# Patient Record
Sex: Male | Born: 1940 | Race: White | Hispanic: No | Marital: Married | Smoking: Former smoker
Health system: Southern US, Community
[De-identification: ages and names within clinical notes are randomized; demographics above are authoritative.]

## PROBLEM LIST (undated history)

## (undated) DIAGNOSIS — M3500 Sicca syndrome, unspecified: Secondary | ICD-10-CM

## (undated) DIAGNOSIS — I10 Essential (primary) hypertension: Secondary | ICD-10-CM

## (undated) DIAGNOSIS — N529 Male erectile dysfunction, unspecified: Secondary | ICD-10-CM

## (undated) DIAGNOSIS — A419 Sepsis, unspecified organism: Secondary | ICD-10-CM

## (undated) DIAGNOSIS — J189 Pneumonia, unspecified organism: Secondary | ICD-10-CM

## (undated) DIAGNOSIS — I2510 Atherosclerotic heart disease of native coronary artery without angina pectoris: Secondary | ICD-10-CM

## (undated) DIAGNOSIS — R310 Gross hematuria: Secondary | ICD-10-CM

## (undated) DIAGNOSIS — J479 Bronchiectasis, uncomplicated: Secondary | ICD-10-CM

## (undated) DIAGNOSIS — F32A Depression, unspecified: Secondary | ICD-10-CM

## (undated) DIAGNOSIS — Z8679 Personal history of other diseases of the circulatory system: Secondary | ICD-10-CM

## (undated) DIAGNOSIS — R0600 Dyspnea, unspecified: Secondary | ICD-10-CM

## (undated) DIAGNOSIS — R972 Elevated prostate specific antigen [PSA]: Secondary | ICD-10-CM

## (undated) DIAGNOSIS — R296 Repeated falls: Secondary | ICD-10-CM

## (undated) DIAGNOSIS — U071 COVID-19: Secondary | ICD-10-CM

## (undated) DIAGNOSIS — J9601 Acute respiratory failure with hypoxia: Secondary | ICD-10-CM

## (undated) DIAGNOSIS — E785 Hyperlipidemia, unspecified: Secondary | ICD-10-CM

## (undated) DIAGNOSIS — E43 Unspecified severe protein-calorie malnutrition: Secondary | ICD-10-CM

## (undated) DIAGNOSIS — I253 Aneurysm of heart: Secondary | ICD-10-CM

## (undated) DIAGNOSIS — I255 Ischemic cardiomyopathy: Secondary | ICD-10-CM

## (undated) DIAGNOSIS — D649 Anemia, unspecified: Secondary | ICD-10-CM

## (undated) DIAGNOSIS — J849 Interstitial pulmonary disease, unspecified: Secondary | ICD-10-CM

## (undated) DIAGNOSIS — W19XXXA Unspecified fall, initial encounter: Secondary | ICD-10-CM

## (undated) DIAGNOSIS — N400 Enlarged prostate without lower urinary tract symptoms: Secondary | ICD-10-CM

## (undated) HISTORY — PX: COLONOSCOPY: SHX174

## (undated) HISTORY — DX: Ischemic cardiomyopathy: I25.5

## (undated) HISTORY — DX: Hyperlipidemia, unspecified: E78.5

## (undated) HISTORY — DX: Atherosclerotic heart disease of native coronary artery without angina pectoris: I25.10

---

## 2007-03-10 DIAGNOSIS — I219 Acute myocardial infarction, unspecified: Secondary | ICD-10-CM

## 2007-03-10 HISTORY — DX: Acute myocardial infarction, unspecified: I21.9

## 2008-03-06 ENCOUNTER — Ambulatory Visit: Payer: Self-pay | Admitting: Cardiovascular Disease

## 2008-03-06 ENCOUNTER — Inpatient Hospital Stay: Payer: Self-pay | Admitting: Cardiovascular Disease

## 2008-03-06 ENCOUNTER — Ambulatory Visit: Payer: Self-pay | Admitting: Family Medicine

## 2008-03-07 ENCOUNTER — Inpatient Hospital Stay (HOSPITAL_COMMUNITY): Admission: AD | Admit: 2008-03-07 | Discharge: 2008-03-17 | Payer: Self-pay | Admitting: Cardiology

## 2008-03-07 ENCOUNTER — Ambulatory Visit: Payer: Self-pay | Admitting: Surgery

## 2008-03-07 ENCOUNTER — Ambulatory Visit: Payer: Self-pay | Admitting: Cardiovascular Disease

## 2008-03-08 ENCOUNTER — Encounter: Payer: Self-pay | Admitting: Surgery

## 2008-03-08 ENCOUNTER — Encounter: Payer: Self-pay | Admitting: Cardiology

## 2008-03-09 DIAGNOSIS — Z951 Presence of aortocoronary bypass graft: Secondary | ICD-10-CM

## 2008-03-09 DIAGNOSIS — I219 Acute myocardial infarction, unspecified: Secondary | ICD-10-CM

## 2008-03-09 HISTORY — PX: CARDIAC CATHETERIZATION: SHX172

## 2008-03-09 HISTORY — DX: Presence of aortocoronary bypass graft: Z95.1

## 2008-03-09 HISTORY — DX: Acute myocardial infarction, unspecified: I21.9

## 2008-03-12 HISTORY — PX: CORONARY ARTERY BYPASS GRAFT: SHX141

## 2008-04-02 ENCOUNTER — Ambulatory Visit: Payer: Self-pay | Admitting: Cardiovascular Disease

## 2008-04-05 ENCOUNTER — Encounter: Admission: RE | Admit: 2008-04-05 | Discharge: 2008-04-05 | Payer: Self-pay | Admitting: Cardiothoracic Surgery

## 2008-04-05 ENCOUNTER — Ambulatory Visit: Payer: Self-pay | Admitting: Cardiothoracic Surgery

## 2008-05-29 ENCOUNTER — Encounter: Payer: Self-pay | Admitting: Cardiovascular Disease

## 2008-05-29 ENCOUNTER — Ambulatory Visit: Payer: Self-pay

## 2008-06-12 ENCOUNTER — Ambulatory Visit: Payer: Self-pay | Admitting: Cardiovascular Disease

## 2008-09-29 DIAGNOSIS — E785 Hyperlipidemia, unspecified: Secondary | ICD-10-CM

## 2008-09-29 DIAGNOSIS — I25810 Atherosclerosis of coronary artery bypass graft(s) without angina pectoris: Secondary | ICD-10-CM

## 2008-09-29 DIAGNOSIS — I2589 Other forms of chronic ischemic heart disease: Secondary | ICD-10-CM

## 2008-11-23 ENCOUNTER — Encounter (INDEPENDENT_AMBULATORY_CARE_PROVIDER_SITE_OTHER): Payer: Self-pay | Admitting: *Deleted

## 2008-12-12 ENCOUNTER — Ambulatory Visit: Payer: Self-pay | Admitting: Cardiovascular Disease

## 2008-12-12 ENCOUNTER — Encounter: Payer: Self-pay | Admitting: Cardiothoracic Surgery

## 2008-12-13 LAB — CONVERTED CEMR LAB
BUN: 18 mg/dL (ref 6–23)
CO2: 22 meq/L (ref 19–32)
Calcium: 9.7 mg/dL (ref 8.4–10.5)
Chloride: 106 meq/L (ref 96–112)
Creatinine, Ser: 0.91 mg/dL (ref 0.40–1.50)
Glucose, Bld: 87 mg/dL (ref 70–99)
Potassium: 4.7 meq/L (ref 3.5–5.3)
Sodium: 142 meq/L (ref 135–145)

## 2008-12-20 ENCOUNTER — Encounter: Payer: Self-pay | Admitting: Cardiovascular Disease

## 2008-12-20 ENCOUNTER — Ambulatory Visit (HOSPITAL_COMMUNITY): Admission: RE | Admit: 2008-12-20 | Discharge: 2008-12-20 | Payer: Self-pay | Admitting: Cardiovascular Disease

## 2008-12-20 ENCOUNTER — Ambulatory Visit: Payer: Self-pay | Admitting: Cardiology

## 2008-12-25 ENCOUNTER — Telehealth (INDEPENDENT_AMBULATORY_CARE_PROVIDER_SITE_OTHER): Payer: Self-pay | Admitting: *Deleted

## 2009-07-11 ENCOUNTER — Telehealth: Payer: Self-pay | Admitting: Cardiovascular Disease

## 2009-08-20 ENCOUNTER — Telehealth: Payer: Self-pay | Admitting: Cardiovascular Disease

## 2009-09-18 IMAGING — CR DG CHEST 2V
2 series · 2 of 2 positions shown · non-contrast
Comparison: Insert [REDACTED] chest x-ray 03/15/2008.

CLINICAL DATA: CABG 03/12/2008.  Former smoker.  No chest
complaints.

CHEST - 2 VIEW

[view not recorded (1 of 2)]
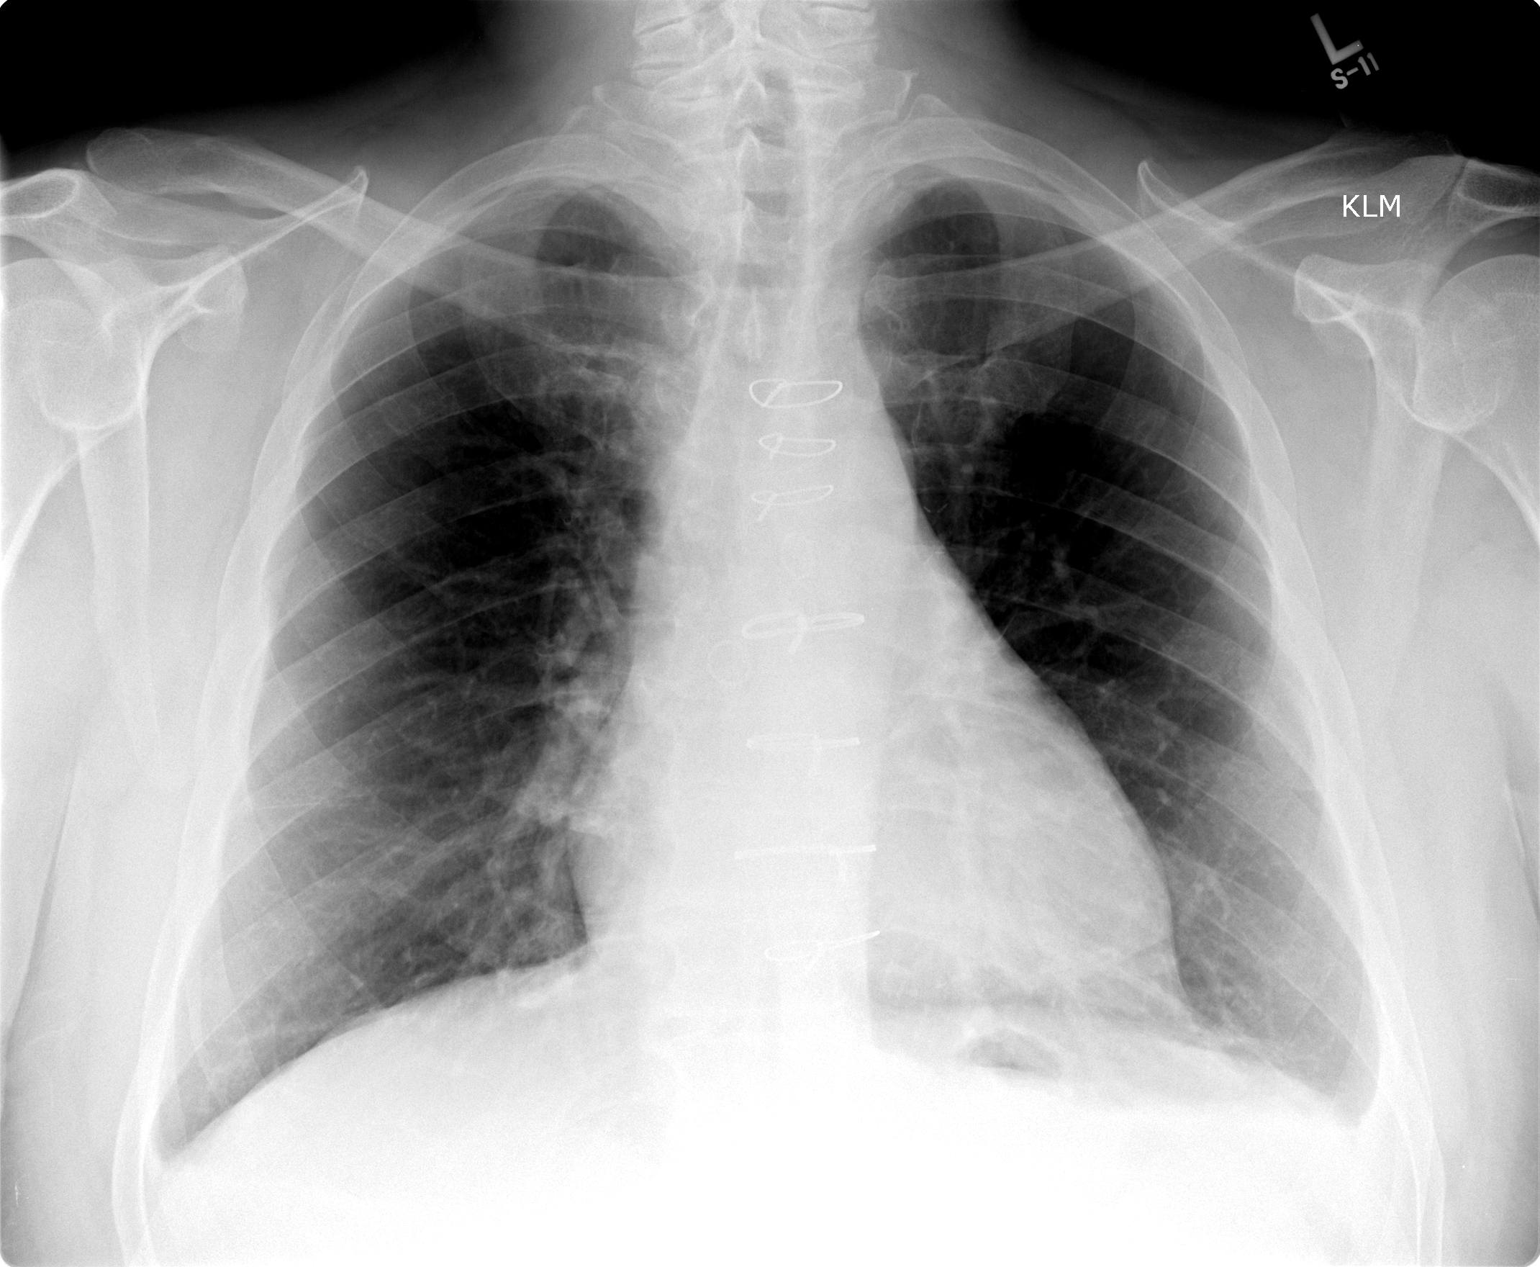

[view not recorded (2 of 2)]
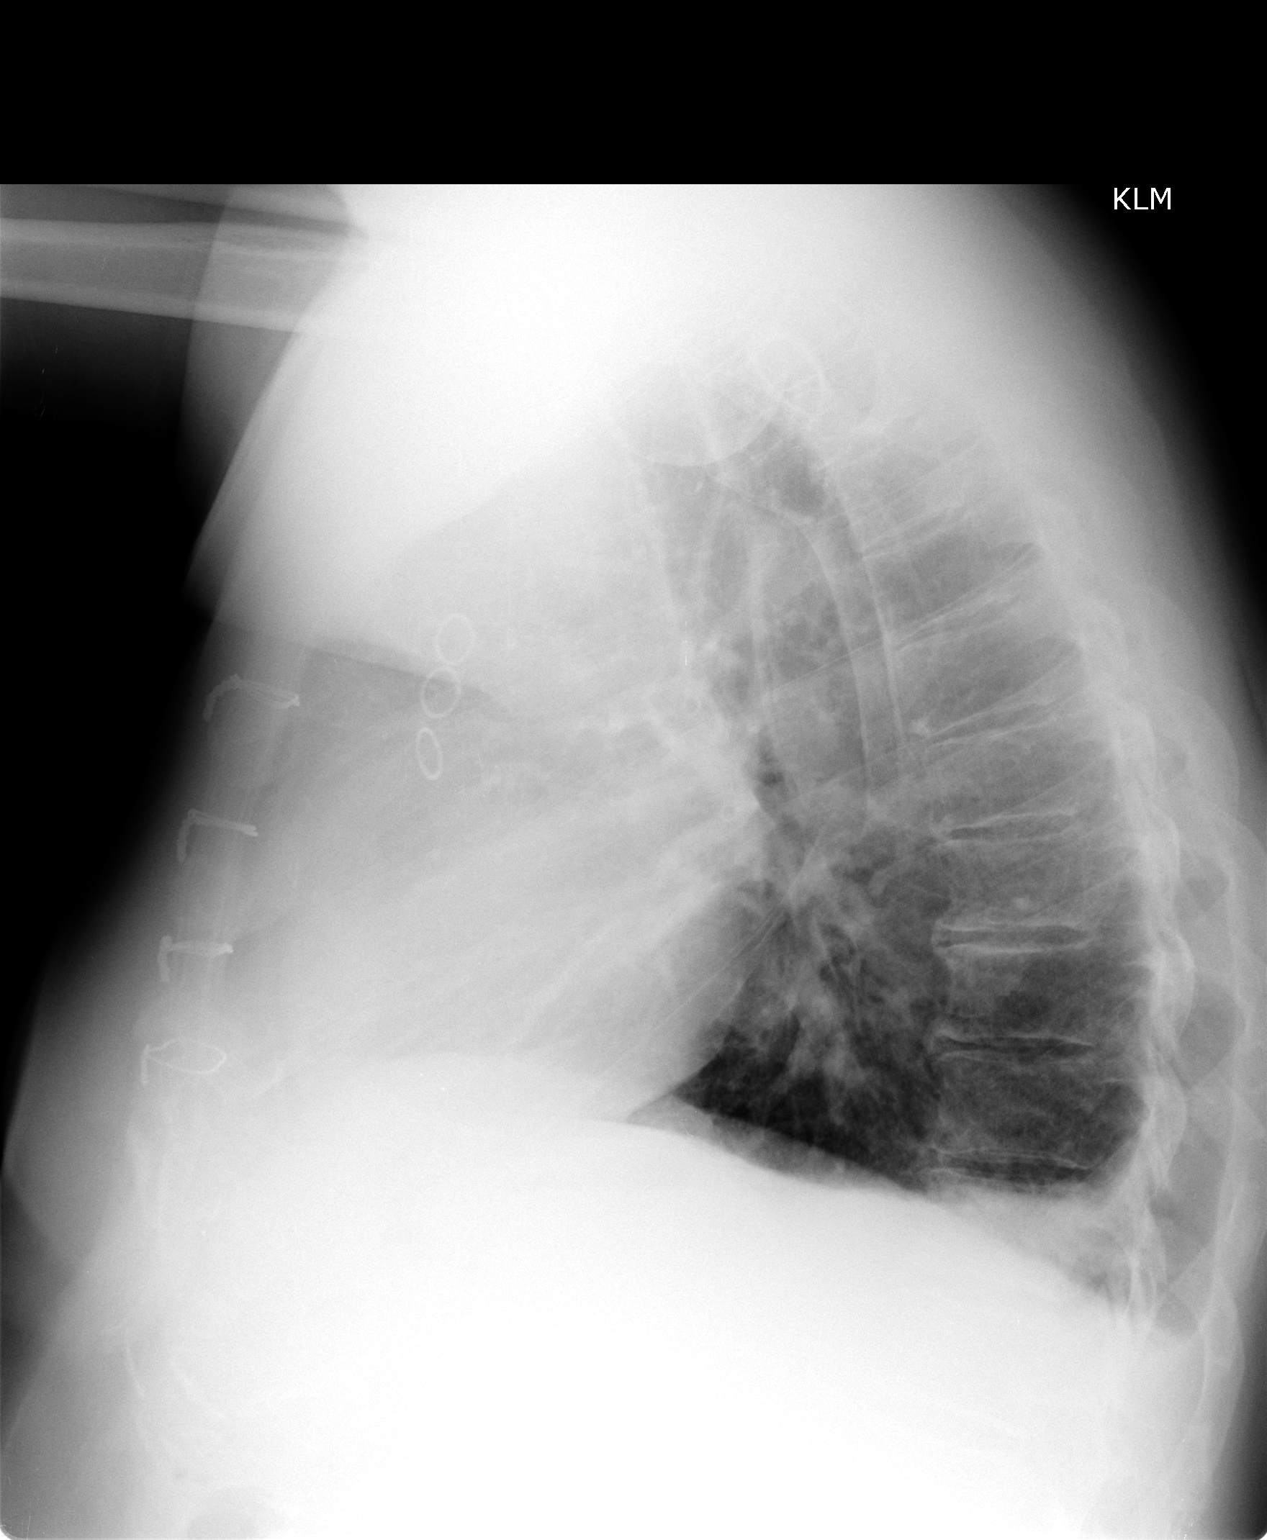

[2 of 2 positions shown; findings below may reference images not displayed]

FINDINGS: Regressing currently tiny residual pleural effusions are
seen.  Minimal linear atelectasis is seen at the lung bases with
lungs otherwise clear without pneumothorax.  Heart size is normal
with post CABG changes.  Stable slight degenerative changes dorsal
spine noted.  No new acute findings seen.
IMPRESSION: 1.  Regressing currently tiny bilateral pleural effusions and
minimal linear bibasilar atelectasis.
2.  Post CABG.
3.  Otherwise no acute findings.

## 2009-11-07 HISTORY — PX: PROSTATE BIOPSY: SHX241

## 2009-12-09 ENCOUNTER — Ambulatory Visit: Payer: Self-pay | Admitting: Family Medicine

## 2010-04-08 NOTE — Assessment & Plan Note (Signed)
Summary: FLU SHOT/EVM    Current Allergies: No known allergies  Assessment New Problems: NEED PROPHYLACTIC VACCINATION&INOCULATION FLU (ICD-V04.81)   The patient and/or caregiver has been counseled thoroughly with regard to medications prescribed including dosage, schedule, interactions, rationale for use, and possible side effects and they verbalize understanding.  Diagnoses and expected course of recovery discussed and will return if not improved as expected or if the condition worsens. Patient and/or caregiver verbalized understanding.   Medication Administration  Injection # 1:    Medication: Influenza    Diagnosis: NEED PROPHYLACTIC VACCINATION&INOCULATION FLU (ICD-V04.81)    Route: IM    Site: R deltoid    Exp Date: 08/07/2010    Lot #: [id number]    Mfr: Gl[employer]oSmithKline    Comments: Assessed pt. for allergies and past immunization history. No allergy to eggs or adverse reactions to any previous flu vaccine. Administered influenza vaccine without complications . Pt. tolerated injection well.    Patient tolerated injection without complications    Given by: R. Hils LPN  Orders Added: 1)  INFLUENZA VIRUS VACCINE SPLIT VIRUS 3 YEARS + I [[id number]]

## 2010-04-08 NOTE — Progress Notes (Signed)
Summary: med refill Carvedilol  Phone Note Call from Patient   Caller: Spouse Call For: [phone] Summary of Call: Wife states pt saw Dr. Excell Seltzer but was discharged.  He has an appt to see Dr. Randa Lynn next week but will run out of Carvedilol before appt. Can we give 1 refill to [facility] [employer]?   Initial call taken by: Park Breed,  August 20, 2009 3:08 PM    Prescriptions: CARVEDILOL 25 MG TABS (CARVEDILOL) Take one  tablet by mouth twice a day  #60 x 6   Entered by:   Bishop Dublin, CMA   Authorized by:   Norva Karvonen, MD   Signed by:   Bishop Dublin, CMA on 08/20/2009   Method used:   Electronically to        [facility] [address] (retail)       [address]       [place], [place]  [address]       Ph: [phone]       Fax: [phone]   RxID:   [id number]

## 2010-04-08 NOTE — Progress Notes (Signed)
Summary: Refill   Phone Note Refill Request Message from:  Patient on Jul 11, 2009 10:19 AM  Refills Requested: Medication #1:  SIMVASTATIN 40 MG TABS Take one tablet by mouth daily at bedtime.  Medication #2:  LISINOPRIL 20 MG TABS Take one tablet by mouth daily [facility] [address]  Initial call taken by: West Carbo,  Jul 11, 2009 10:20 AM Caller: Spouse Call For: Dr. Excell Seltzer    Prescriptions: LISINOPRIL 20 MG TABS (LISINOPRIL) Take one tablet by mouth daily  #30 x 6   Entered by:   Mercer Pod   Authorized by:   Norva Karvonen, MD   Signed by:   Mercer Pod on 07/11/2009   Method used:   Electronically to        [facility] [address] (retail)       [address]       [place], [place]  [address]       Ph: [phone]       Fax: [phone]   RxID:   [id number] SIMVASTATIN 40 MG TABS (SIMVASTATIN) Take one tablet by mouth daily at bedtime  #30 x 6   Entered by:   Mercer Pod   Authorized by:   Norva Karvonen, MD   Signed by:   Mercer Pod on 07/11/2009   Method used:   Electronically to        [facility] [address] (retail)       [address]       [place], [place]  [address]       Ph: [phone]       Fax: [phone]   RxID:   [id number]

## 2010-06-23 LAB — GLUCOSE, CAPILLARY
Glucose-Capillary: 109 mg/dL — ABNORMAL HIGH (ref 70–99)
Glucose-Capillary: 110 mg/dL — ABNORMAL HIGH (ref 70–99)
Glucose-Capillary: 113 mg/dL — ABNORMAL HIGH (ref 70–99)
Glucose-Capillary: 114 mg/dL — ABNORMAL HIGH (ref 70–99)
Glucose-Capillary: 117 mg/dL — ABNORMAL HIGH (ref 70–99)
Glucose-Capillary: 119 mg/dL — ABNORMAL HIGH (ref 70–99)
Glucose-Capillary: 122 mg/dL — ABNORMAL HIGH (ref 70–99)
Glucose-Capillary: 126 mg/dL — ABNORMAL HIGH (ref 70–99)
Glucose-Capillary: 126 mg/dL — ABNORMAL HIGH (ref 70–99)
Glucose-Capillary: 126 mg/dL — ABNORMAL HIGH (ref 70–99)
Glucose-Capillary: 127 mg/dL — ABNORMAL HIGH (ref 70–99)
Glucose-Capillary: 128 mg/dL — ABNORMAL HIGH (ref 70–99)
Glucose-Capillary: 130 mg/dL — ABNORMAL HIGH (ref 70–99)
Glucose-Capillary: 131 mg/dL — ABNORMAL HIGH (ref 70–99)
Glucose-Capillary: 132 mg/dL — ABNORMAL HIGH (ref 70–99)
Glucose-Capillary: 136 mg/dL — ABNORMAL HIGH (ref 70–99)
Glucose-Capillary: 139 mg/dL — ABNORMAL HIGH (ref 70–99)

## 2010-06-23 LAB — CBC
HCT: 29.5 % — ABNORMAL LOW (ref 39.0–52.0)
HCT: 29.6 % — ABNORMAL LOW (ref 39.0–52.0)
HCT: 30 % — ABNORMAL LOW (ref 39.0–52.0)
HCT: 30 % — ABNORMAL LOW (ref 39.0–52.0)
HCT: 30.5 % — ABNORMAL LOW (ref 39.0–52.0)
HCT: 30.7 % — ABNORMAL LOW (ref 39.0–52.0)
HCT: 31.5 % — ABNORMAL LOW (ref 39.0–52.0)
HCT: 40.4 % (ref 39.0–52.0)
HCT: 40.5 % (ref 39.0–52.0)
Hemoglobin: 10 g/dL — ABNORMAL LOW (ref 13.0–17.0)
Hemoglobin: 10.1 g/dL — ABNORMAL LOW (ref 13.0–17.0)
Hemoglobin: 10.2 g/dL — ABNORMAL LOW (ref 13.0–17.0)
Hemoglobin: 10.2 g/dL — ABNORMAL LOW (ref 13.0–17.0)
Hemoglobin: 10.4 g/dL — ABNORMAL LOW (ref 13.0–17.0)
Hemoglobin: 10.8 g/dL — ABNORMAL LOW (ref 13.0–17.0)
Hemoglobin: 13.3 g/dL (ref 13.0–17.0)
MCHC: 33.2 g/dL (ref 30.0–36.0)
MCHC: 33.7 g/dL (ref 30.0–36.0)
MCHC: 34 g/dL (ref 30.0–36.0)
MCHC: 34 g/dL (ref 30.0–36.0)
MCHC: 34 g/dL (ref 30.0–36.0)
MCHC: 34.2 g/dL (ref 30.0–36.0)
MCHC: 34.3 g/dL (ref 30.0–36.0)
MCV: 85.2 fL (ref 78.0–100.0)
MCV: 85.2 fL (ref 78.0–100.0)
MCV: 85.3 fL (ref 78.0–100.0)
MCV: 85.3 fL (ref 78.0–100.0)
MCV: 85.4 fL (ref 78.0–100.0)
MCV: 85.8 fL (ref 78.0–100.0)
MCV: 86.1 fL (ref 78.0–100.0)
MCV: 86.2 fL (ref 78.0–100.0)
MCV: 86.3 fL (ref 78.0–100.0)
MCV: 86.5 fL (ref 78.0–100.0)
Platelets: 206 10*3/uL (ref 150–400)
Platelets: 229 10*3/uL (ref 150–400)
Platelets: 250 10*3/uL (ref 150–400)
Platelets: 251 10*3/uL (ref 150–400)
Platelets: 255 10*3/uL (ref 150–400)
Platelets: 270 10*3/uL (ref 150–400)
Platelets: 273 10*3/uL (ref 150–400)
Platelets: 325 10*3/uL (ref 150–400)
Platelets: 341 10*3/uL (ref 150–400)
Platelets: 414 10*3/uL — ABNORMAL HIGH (ref 150–400)
RBC: 3.46 MIL/uL — ABNORMAL LOW (ref 4.22–5.81)
RBC: 3.47 MIL/uL — ABNORMAL LOW (ref 4.22–5.81)
RBC: 3.49 MIL/uL — ABNORMAL LOW (ref 4.22–5.81)
RBC: 3.52 MIL/uL — ABNORMAL LOW (ref 4.22–5.81)
RBC: 3.57 MIL/uL — ABNORMAL LOW (ref 4.22–5.81)
RBC: 3.58 MIL/uL — ABNORMAL LOW (ref 4.22–5.81)
RBC: 3.67 MIL/uL — ABNORMAL LOW (ref 4.22–5.81)
RBC: 4.74 MIL/uL (ref 4.22–5.81)
RDW: 12.8 % (ref 11.5–15.5)
RDW: 12.8 % (ref 11.5–15.5)
RDW: 13.1 % (ref 11.5–15.5)
RDW: 13.2 % (ref 11.5–15.5)
RDW: 13.3 % (ref 11.5–15.5)
RDW: 13.5 % (ref 11.5–15.5)
RDW: 13.5 % (ref 11.5–15.5)
RDW: 13.5 % (ref 11.5–15.5)
WBC: 10.3 10*3/uL (ref 4.0–10.5)
WBC: 10.8 10*3/uL — ABNORMAL HIGH (ref 4.0–10.5)
WBC: 11.1 10*3/uL — ABNORMAL HIGH (ref 4.0–10.5)
WBC: 11.8 10*3/uL — ABNORMAL HIGH (ref 4.0–10.5)
WBC: 11.9 10*3/uL — ABNORMAL HIGH (ref 4.0–10.5)
WBC: 13.4 10*3/uL — ABNORMAL HIGH (ref 4.0–10.5)
WBC: 14.5 10*3/uL — ABNORMAL HIGH (ref 4.0–10.5)
WBC: 16.9 10*3/uL — ABNORMAL HIGH (ref 4.0–10.5)
WBC: 20.2 10*3/uL — ABNORMAL HIGH (ref 4.0–10.5)

## 2010-06-23 LAB — POCT I-STAT 4, (NA,K, GLUC, HGB,HCT)
Glucose, Bld: 103 mg/dL — ABNORMAL HIGH (ref 70–99)
Glucose, Bld: 113 mg/dL — ABNORMAL HIGH (ref 70–99)
Glucose, Bld: 120 mg/dL — ABNORMAL HIGH (ref 70–99)
Glucose, Bld: 139 mg/dL — ABNORMAL HIGH (ref 70–99)
Glucose, Bld: 174 mg/dL — ABNORMAL HIGH (ref 70–99)
Glucose, Bld: 99 mg/dL (ref 70–99)
HCT: 29 % — ABNORMAL LOW (ref 39.0–52.0)
HCT: 30 % — ABNORMAL LOW (ref 39.0–52.0)
HCT: 32 % — ABNORMAL LOW (ref 39.0–52.0)
HCT: 38 % — ABNORMAL LOW (ref 39.0–52.0)
Hemoglobin: 10.9 g/dL — ABNORMAL LOW (ref 13.0–17.0)
Hemoglobin: 11.2 g/dL — ABNORMAL LOW (ref 13.0–17.0)
Hemoglobin: 9.5 g/dL — ABNORMAL LOW (ref 13.0–17.0)
Hemoglobin: 9.9 g/dL — ABNORMAL LOW (ref 13.0–17.0)
Potassium: 4.2 mEq/L (ref 3.5–5.1)
Potassium: 4.6 mEq/L (ref 3.5–5.1)
Potassium: 4.7 mEq/L (ref 3.5–5.1)
Potassium: 4.9 mEq/L (ref 3.5–5.1)
Sodium: 131 mEq/L — ABNORMAL LOW (ref 135–145)
Sodium: 132 mEq/L — ABNORMAL LOW (ref 135–145)
Sodium: 134 mEq/L — ABNORMAL LOW (ref 135–145)
Sodium: 135 mEq/L (ref 135–145)
Sodium: 135 mEq/L (ref 135–145)
Sodium: 135 mEq/L (ref 135–145)

## 2010-06-23 LAB — BLOOD GAS, ARTERIAL
Acid-base deficit: 1.5 mmol/L (ref 0.0–2.0)
Bicarbonate: 22 mEq/L (ref 20.0–24.0)
FIO2: 0.21 %
O2 Saturation: 97.7 %
Patient temperature: 98.6
TCO2: 23 mmol/L (ref 0–100)
pCO2 arterial: 32.7 mmHg — ABNORMAL LOW (ref 35.0–45.0)
pH, Arterial: 7.442 (ref 7.350–7.450)
pO2, Arterial: 92.2 mmHg (ref 80.0–100.0)

## 2010-06-23 LAB — URINALYSIS, ROUTINE W REFLEX MICROSCOPIC
Bilirubin Urine: NEGATIVE
Glucose, UA: 100 mg/dL — AB
Glucose, UA: NEGATIVE mg/dL
Ketones, ur: >80 mg/dL — AB
Ketones, ur: NEGATIVE mg/dL
Leukocytes, UA: NEGATIVE
Nitrite: NEGATIVE
Nitrite: NEGATIVE
Protein, ur: 30 mg/dL — AB
Protein, ur: NEGATIVE mg/dL
Specific Gravity, Urine: 1.011 (ref 1.005–1.030)
Specific Gravity, Urine: 1.036 — ABNORMAL HIGH (ref 1.005–1.030)
Urobilinogen, UA: 0.2 mg/dL (ref 0.0–1.0)
Urobilinogen, UA: 0.2 mg/dL (ref 0.0–1.0)
pH: 6 (ref 5.0–8.0)
pH: 7 (ref 5.0–8.0)

## 2010-06-23 LAB — BASIC METABOLIC PANEL
BUN: 10 mg/dL (ref 6–23)
BUN: 10 mg/dL (ref 6–23)
BUN: 11 mg/dL (ref 6–23)
BUN: 13 mg/dL (ref 6–23)
BUN: 13 mg/dL (ref 6–23)
BUN: 14 mg/dL (ref 6–23)
BUN: 15 mg/dL (ref 6–23)
CO2: 22 mEq/L (ref 19–32)
CO2: 23 mEq/L (ref 19–32)
CO2: 24 mEq/L (ref 19–32)
CO2: 26 mEq/L (ref 19–32)
Calcium: 8.8 mg/dL (ref 8.4–10.5)
Calcium: 8.8 mg/dL (ref 8.4–10.5)
Calcium: 9.1 mg/dL (ref 8.4–10.5)
Chloride: 104 mEq/L (ref 96–112)
Chloride: 105 mEq/L (ref 96–112)
Chloride: 105 mEq/L (ref 96–112)
Chloride: 105 mEq/L (ref 96–112)
Chloride: 105 mEq/L (ref 96–112)
Creatinine, Ser: 0.97 mg/dL (ref 0.4–1.5)
Creatinine, Ser: 0.99 mg/dL (ref 0.4–1.5)
Creatinine, Ser: 1.03 mg/dL (ref 0.4–1.5)
Creatinine, Ser: 1.06 mg/dL (ref 0.4–1.5)
GFR calc Af Amer: >60 mL/min (ref >60)
GFR calc Af Amer: >60 mL/min (ref >60)
GFR calc Af Amer: >60 mL/min (ref >60)
GFR calc Af Amer: >60 mL/min (ref >60)
GFR calc Af Amer: >60 mL/min (ref >60)
GFR calc Af Amer: >60 mL/min (ref >60)
GFR calc non Af Amer: >60 mL/min (ref >60)
GFR calc non Af Amer: >60 mL/min (ref >60)
GFR calc non Af Amer: >60 mL/min (ref >60)
GFR calc non Af Amer: >60 mL/min (ref >60)
GFR calc non Af Amer: >60 mL/min (ref >60)
Glucose, Bld: 110 mg/dL — ABNORMAL HIGH (ref 70–99)
Glucose, Bld: 112 mg/dL — ABNORMAL HIGH (ref 70–99)
Glucose, Bld: 123 mg/dL — ABNORMAL HIGH (ref 70–99)
Glucose, Bld: 96 mg/dL (ref 70–99)
Potassium: 3.6 mEq/L (ref 3.5–5.1)
Potassium: 3.7 mEq/L (ref 3.5–5.1)
Potassium: 4 mEq/L (ref 3.5–5.1)
Potassium: 4 mEq/L (ref 3.5–5.1)
Sodium: 135 mEq/L (ref 135–145)
Sodium: 136 mEq/L (ref 135–145)
Sodium: 137 mEq/L (ref 135–145)
Sodium: 139 mEq/L (ref 135–145)
Sodium: 139 mEq/L (ref 135–145)

## 2010-06-23 LAB — POCT I-STAT 3, ART BLOOD GAS (G3+)
Acid-Base Excess: 1 mmol/L (ref 0.0–2.0)
Acid-base deficit: 2 mmol/L (ref 0.0–2.0)
Bicarbonate: 21.7 mEq/L (ref 20.0–24.0)
Bicarbonate: 22.7 mEq/L (ref 20.0–24.0)
Bicarbonate: 27.4 mEq/L — ABNORMAL HIGH (ref 20.0–24.0)
O2 Saturation: 100 %
O2 Saturation: 100 %
O2 Saturation: 100 %
Patient temperature: 36
Patient temperature: 37.4
TCO2: 23 mmol/L (ref 0–100)
TCO2: 24 mmol/L (ref 0–100)
TCO2: 25 mmol/L (ref 0–100)
pCO2 arterial: 40.4 mmHg (ref 35.0–45.0)
pH, Arterial: 7.352 (ref 7.350–7.450)
pH, Arterial: 7.403 (ref 7.350–7.450)
pH, Arterial: 7.411 (ref 7.350–7.450)

## 2010-06-23 LAB — POCT I-STAT, CHEM 8
BUN: 12 mg/dL (ref 6–23)
Calcium, Ion: 1.17 mmol/L (ref 1.12–1.32)
Chloride: 106 mEq/L (ref 96–112)
Creatinine, Ser: 1 mg/dL (ref 0.4–1.5)
Glucose, Bld: 147 mg/dL — ABNORMAL HIGH (ref 70–99)
HCT: 29 % — ABNORMAL LOW (ref 39.0–52.0)
Potassium: 4.2 mEq/L (ref 3.5–5.1)

## 2010-06-23 LAB — URINE MICROSCOPIC-ADD ON

## 2010-06-23 LAB — CREATININE, SERUM
Creatinine, Ser: 0.89 mg/dL (ref 0.4–1.5)
Creatinine, Ser: 0.95 mg/dL (ref 0.4–1.5)
GFR calc Af Amer: >60 mL/min (ref >60)
GFR calc Af Amer: >60 mL/min (ref >60)
GFR calc non Af Amer: >60 mL/min (ref >60)
GFR calc non Af Amer: >60 mL/min (ref >60)

## 2010-06-23 LAB — URINE CULTURE
Colony Count: NO GROWTH
Culture: NO GROWTH

## 2010-06-23 LAB — TYPE AND SCREEN: ABO/RH(D): O POS

## 2010-06-23 LAB — PLATELET COUNT: Platelets: 276 10*3/uL (ref 150–400)

## 2010-06-23 LAB — CK TOTAL AND CKMB (NOT AT ARMC)
CK, MB: 15.9 ng/mL — ABNORMAL HIGH (ref 0.3–4.0)
Relative Index: 4.5 — ABNORMAL HIGH (ref 0.0–2.5)
Total CK: 354 U/L — ABNORMAL HIGH (ref 7–232)

## 2010-06-23 LAB — PROTIME-INR
INR: 1.4 (ref 0.00–1.49)
Prothrombin Time: 18.2 seconds — ABNORMAL HIGH (ref 11.6–15.2)

## 2010-06-23 LAB — HEPARIN LEVEL (UNFRACTIONATED)
Heparin Unfractionated: 0.25 IU/mL — ABNORMAL LOW (ref 0.30–0.70)
Heparin Unfractionated: 0.47 IU/mL (ref 0.30–0.70)
Heparin Unfractionated: 0.5 IU/mL (ref 0.30–0.70)

## 2010-06-23 LAB — MAGNESIUM
Magnesium: 2.6 mg/dL — ABNORMAL HIGH (ref 1.5–2.5)
Magnesium: 2.6 mg/dL — ABNORMAL HIGH (ref 1.5–2.5)
Magnesium: 2.8 mg/dL — ABNORMAL HIGH (ref 1.5–2.5)

## 2010-06-23 LAB — POCT I-STAT 3, VENOUS BLOOD GAS (G3P V)
Acid-base deficit: 1 mmol/L (ref 0.0–2.0)
pO2, Ven: 53 mmHg — ABNORMAL HIGH (ref 30.0–45.0)

## 2010-06-23 LAB — APTT: aPTT: 36 seconds (ref 24–37)

## 2010-06-23 LAB — ABO/RH: ABO/RH(D): O POS

## 2010-07-22 NOTE — Discharge Summary (Signed)
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   IN-HOSPITAL DIAGNOSES:  1. Volume overload postoperatively.  2. Acute blood loss anemia postoperatively.   SECONDARY DIAGNOSES:  1. History of prostatitis.  2. History of tobacco abuse.   IN-HOSPITAL OPERATIONS AND PROCEDURES:  1. Cardiac catheterization.  2. Coronary artery bypass grafting x5 using a left internal mammary      artery to left anterior descending coronary artery, reverse      saphenous vein graft to distal first obtuse marginal, reverse      saphenous vein graft sequentially to second obtuse marginal and      distal circumflex, reverse saphenous vein graft to midposterior      descending coronary artery with right leg and no vein harvesting.   HISTORY AND PHYSICAL AND HOSPITAL COURSE:  The patient is a 70 year old  gentleman who was separated and have hospital myocardial infarction 68  days prior to surgery.  The patient was admitted with troponin of 20, LV  dysfunction with significant anterior and inferior hypokinesis and  ejection fraction 30%.  Cardiac catheterization done showed severe three-  vessel coronary artery disease.  His ejection fraction was reduced to  30%.  Dr. Laneta Simmers was consulted.  Dr. Laneta Simmers saw and evaluated the  patient.  He discussed with the patient undergoing coronary artery  bypass grafting.  He discussed the risks and benefits with the patient.  The patient acknowledged understanding and agreed to proceed.  Surgery  was scheduled for Dr. Tyrone Sage to  due on March 12, 2008.  The patient  underwent preoperative bilateral carotid duplex ultrasound, which showed  no significant ICA stenosis.  He remained stable preoperatively.   The patient was taken to the operating room on March 12, 2008 by Dr.  Tyrone Sage where he underwent coronary artery bypass grafting x5 using a  left internal mammary artery to left anterior descending coronary  artery, reverse saphenous vein graft to distal first obtuse marginal,  reverse saphenous vein graft sequentially to second obtuse marginal and  distal circumflex, reverse saphenous vein graft to midposterior  descending coronary artery.  Right leg endovein harvesting was done.  The patient tolerated this procedure and was transferred to the  intensive care unit in stable condition.  Postoperatively, the patient  was noted to be hemodynamically stable.  He was extubated in the evening  of surgery.  Postextubation, the patient noted to be alert and oriented  x4.  Neuro intact.  The patient's postoperative course was pretty much  unremarkable.  He was noted to be in normal sinus rhythm  postoperatively.  Blood pressure was stable.  He was able to be weaned  and discontinued off of all inotropic drips.  The patient was started on  beta-blocker and tolerated well.  Blood pressure was still mildly  elevated and was started on ACE inhibitor.  This stabilized the  patient's blood pressure.  The patient's heart rate remained stable and  normal sinus rhythm.  From pulmonary standpoint, the patient's  postoperative chest x-ray which was stable.  Chest tube discontinued in  normal fashion.  He was able to be weaned off oxygen with O2 sats  greater than 90% on room air.  The patient remained in the intensive  care unit of postop day #2 and was noted to be stable and transferred  out to PCTU.  He did have some mild volume overload postoperatively and  started on diuretics.  The patient was back near baseline weight  prior  to discharge home.  The patient also had some mild acute blood loss  anemia, but did not require any transfusions.  It was followed and  remained stable.  Postoperatively, cardiac rehab was working with the  patient.  He was tolerating and ambulating well.  The patient was also  started on heart-healthy diet, which he was tolerating well.  All  incisions were noted to be clean, dry, and intact and healing well.   On postop day #68, March 16, 2008, the patient continued to progress  well.  This felt that he remains stable.  He will be discharged home in  the a.m. postop day #5.  Last lab work obtained showed sodium of 139,  potassium 3.6, chloride of 105, bicarb of 26, BUN of 13, creatinine of  1.06, glucose of 112.  White blood cell count 13.4, hemoglobin of 10.1,  hematocrit of 29.6, platelet count 270.   FOLLOWUP APPOINTMENTS:  A followup appointment has been arranged with  Dr. Tyrone Sage for April 05, 2008 at 11:45 a.m.  The patient will need  to obtain PA and lateral chest x-ray 30 minutes prior to this  appointment.  The patient will need to follow up with Dr. Antoine Poche in 2  weeks.  He will need to contact his office to make these arrangements.   ACTIVITY:  The patient was instructed no driving to released to do so,  no heavy lifting over 10 pounds.  He was told to ambulate 3-4 times per  day progress as tolerated and to continue his breathing exercises.   DIET:  The patient is educated on diet to be low-fat, low-salt.   INCISIONAL CARE:  The patient is told shower, washing his incisions  using soap and water.  He is to contact the office if he develops any  drainage or opening from any of his incision sites.   DISCHARGE MEDICATIONS:  1. Aspirin 325 mg daily.  2. Coreg 25 mg b.i.d.  3. Lisinopril 10 mg daily.  4. Crestor 40 mg at night.  5. Oxycodone 5 mg 1-2 tablets q.4-6 h. p.r.n.      Theda Belfast, [place]      Sheliah Plane, MD  Electronically  Signed    KMD/MEDQ  D:  03/16/2008  T:  03/16/2008  Job:  [id number]   cc:   Rollene Rotunda, MD, [facility]

## 2010-07-22 NOTE — Op Note (Signed)
Nicholas Alvarez, Nicholas Alvarez NO.:  [id number]   MEDICAL RECORD NO.:  [id number]          PATIENT TYPE:  INP   LOCATION:  2311                         FACILITY:  [facility]   PHYSICIAN:  Sheliah Plane, MD    DATE OF BIRTH:  02-Mar-1941   DATE OF PROCEDURE:  03/12/2008  DATE OF DISCHARGE:                               OPERATIVE REPORT   PREOPERATIVE DIAGNOSES:  Recent acute anterior myocardial infarction  with left ventricular dysfunction and severe three-vessel coronary  artery disease.   POSTOPERATIVE DIAGNOSES:  Recent acute anterior myocardial infarction  with left ventricular dysfunction and severe three-vessel coronary  artery disease.   SURGICAL PROCEDURES:  Coronary artery bypass grafting x5 with left  internal mammary to left anterior descending coronary artery, reverse  saphenous vein graft to the distal first obtuse marginal, reverse  saphenous vein graft sequentially to the second obtuse marginal and  distal circumflex, reverse saphenous vein graft to the mid posterior  descending coronary artery with right leg endovein harvesting.  Right  thigh and calf endovein harvesting.   SURGEON:  Sheliah Plane, MD   FIRST ASSISTANT:  Kerin Perna, MD   SECOND ASSISTANT:  Doree Fudge, PA   BRIEF HISTORY:  The patient is a 70 year old male who suffered an out-of-  hospital myocardial infarction 4 days prior to surgery, was admitted  with troponin of 20, LV dysfunction with significant anterior and  inferior hypokinesis, and ejection fraction of 30%.  Cardiac  catheterization was done in [place].  From cath lab, the patient was  transferred to [facility].  At the time of catheterization, he was  found to have a high-grade greater than 90% proximal LAD stenosis, 90%  distal first obtuse marginal obstruction, 80% mid circumflex  obstruction, 80% distal circumflex obstruction.  The right coronary  artery was diffusely diseased with a very small distal  posterior  descending coronary artery with diffuse disease with ejection fraction  reduced to 30%.  Risks and options were discussed with the patient.  Coronary artery bypass grafting was recommended.  The patient agreed and  signed informed consent.   DESCRIPTION OF PROCEDURE:  With Swan-Ganz and arterial line monitors in  place, the patient underwent general endotracheal anesthesia without  incident.  Skin in the chest and legs were prepped with Betadine and  draped in the usual sterile manner.  Using the [employer] endovein  harvesting system, the vein was harvested from the right thigh and calf  and was of adequate quality and caliber.  Median sternotomy was  performed and left internal mammary artery was dissected down as pedicle  graft.  Distal artery was divided and had good free flow.  Pericardium  was opened.  Overall ventricular function appeared depressed with  obvious anterior wall hypokinesis and evidence of recent infarction.  The patient was systemically heparinized.  The ascending aorta and the  right atrium were cannulated.  Aortic root vent cardioplegia needle was  introduced into the ascending aorta.  The patient was placed on  cardiopulmonary bypass at 2.4 liters per minute per meter square.  Sites  of anastomosis  were selected and dissected out of the epicardium.  The  patient's body temperature was cooled to 30 degrees.  The aortic  crossclamp was applied and 500 mL of cold blood potassium cardioplegia  was administered with diastolic arrest of the heart.  Myocardial septal  temperatures monitored throughout the crossclamp.  Attention was turned  first to the posterior descending coronary artery, which was very  diffusely diseased and the midportion of the vessel was opened, very  distal vessel was very small and even a 1-mm probe would not pass.  Using a second reverse saphenous vein graft, anastomosed was performed  with a running 7-0 Prolene.  Attention was then  turned to the lateral  wall of the heart.  The second obtuse marginal was opened partially, it  was partially intramyocardial, a 1.5-mm probe passed distally using a  diamond-type side-to-side anastomosis was carried out with a second  reverse saphenous vein graft.  Distal extent of the same vein was then  carried a short distance to the distal circumflex, which was opened and  admitted a 1.5-mm probe.  A distal anastomosis was performed with  running 7-0 Prolene.  Additional cold blood cardioplegia was  administered down the vein graft.  Attention was then turned to the  first obtuse marginal, which had a distal disease just prior to a  bifurcation point.  The larger of the 2 distal bifurcation branches were  opened and admitted 1-mm probe distally.  Using a running 7-0 Prolene,  distal anastomosis was performed in the distal first obtuse marginal.  Attention was then turned to left anterior descending coronary artery,  which was opened in the midportion of the vessel and admitted 1.5-mm  probe distally.  Using a running 8-0 Prolene, left internal mammary  artery was anastomosed to the left anterior descending coronary artery.  With crossclamps still in place, 3 punch aortotomies were performed in  the ascending aorta.  Each of the 3 vein grafts were anastomosed to the  ascending aorta.  Air was evacuated from the grafts and the veins.  An  aortic crossclamp was removed with total crossclamp time of 99 minutes.  Prior to removing the crossclamp, bulldog on the mammary artery was  removed and there was prompt rise in myocardial septal temperature.  Sites of anastomosis were inspected free of bleeding.  The patient was  then rewarmed to 37 degrees.  Low-dose milrinone and dopamine infusions  were started.  He was then ventilated and weaned cardiopulmonary bypass  without difficulty, remained hemodynamically stable, was decannulated in  usual fashion.  Protamine sulfate was carried out with  bipolar ventricle  wire and two atrial wires were placed.  The left pleural tube a Blake  mediastinal drain were left in place.  Pericardium was loosely  reapproximated.  Graft markers were placed.  Sternum was closed with #6  stainless steel wire.  Fascia closed with interrupted 0 Vicryl and 3-0  Vicryl subcutaneous tissue, 4-0 subcuticular stitch in skin edges.  Dry  dressings were applied.  Sponge and needle count was reported as correct  at the completion of the procedure.  The patient tolerated the procedure  without obvious complication and was transferred to surgical intensive  care unit for further postoperative care.      Sheliah Plane, MD  Electronically Signed     EG/MEDQ  D:  03/13/2008  T:  03/13/2008  Job:  [id number]   cc:   Rollene Rotunda, MD, [facility]

## 2010-07-22 NOTE — Assessment & Plan Note (Signed)
OFFICE VISIT   Nicholas Alvarez, Nicholas Alvarez  DOB:  [birth year]                                        April 05, 2008  CHART #:  [id number]   The patient returns to the office today in followup after his recent  acute anterior myocardial infarction with LV dysfunction and severe  three-vessel coronary artery disease.  He had had underwent coronary  artery bypass grafting x5 with left internal mammary to the LAD, reverse  saphenous vein graft to the first obtuse marginal, reverse saphenous  vein graft sequentially to the second obtuse marginal and distal  circumflex, reverse saphenous vein graft to the mid posterior descending  coronary artery with right leg endovein harvesting done on March 12, 2008.  He is making good progress.  Postoperatively, he has had some  swelling in the right ankle and soreness along the endovein harvest  site, which seems to be improving.  He has no pedal edema on the left.  Initially, he had been discharged home on short course of Lasix and this  has now been discontinued.  He has had no recurrent angina.  He has been  walking in an indoor mall close to his house in [place].   PHYSICAL EXAMINATION:  VITAL SIGNS:  His blood pressure 98/62, pulse 68,  respiratory rate 16, and O2 sats 95% on room air.  LUNGS:  Clear bilaterally.  His sternum is stable and well healed.  EXTREMITIES:  He has slight pedal edema at the right ankle, none at the  left.  The endovein harvest site appear healing well without evidence of  infection.  He has no pedal edema in the left ankle.   Followup chest x-ray shows regression of tiny bilateral pleural  effusions.  Otherwise, clear lung fields.   The patient comes in today without his current medication list.  He  notes that when he saw Dr. Excell Seltzer, his Crestor was changed, but could  not remember any of the other changes and was not sure what medications  he was on.  I stressed the importance of bringing his  medication list  with him.  It sounds like, on his last visit to Dr. Excell Seltzer, this was  reviewed and he said that he has a written list at home.   I have not made him a return appointment to see me, but encouraged him  to continue with his rehab program and he is to see Dr. Excell Seltzer in 6  weeks.   Sheliah Plane, MD  Electronically Signed   EG/MEDQ  Alvarez:  04/05/2008  T:  04/05/2008  Job:  [id number]   cc:   Veverly Fells. Excell Seltzer, MD

## 2010-07-22 NOTE — Assessment & Plan Note (Signed)
[facility] OFFICE NOTE   NAME:Nicholas Alvarez, Nicholas Alvarez                         MRN:          [id number]  DATE:04/02/2008                            DOB:          [birth year]    REASON FOR VISIT:  Followup CAD.   HISTORY OF PRESENT ILLNESS:  Nicholas Alvarez is a 70 year old gentleman who  presented last month with an out of hospital anterior wall MI.  He came  to [facility] because of congestive heart failure.  He underwent  diagnostic catheterization that showed critical three-vessel coronary  artery disease and severe LV dysfunction.  He was transferred from  [facility] to [facility] where he  ultimately underwent coronary artery bypass grafting by Dr. Tyrone Sage.  He had a five-vessel bypass with LIMA to LAD, saphenous vein graft to  OM, sequential saphenous vein graft to second OM and distal circumflex,  and saphenous vein graft to PDA.  His post-surgical hospital stay was  uncomplicated and he was discharged home.  Since discharge home, Nicholas  Alvarez has done relatively well.  He complains of some chest discomfort  with certain movements where it feels like a stretching feeling.  He  denies exertional chest discomfort or shortness of breath.  He has had  no lower extremity edema.  He denies palpitations, lightheadedness, or  syncope.  He has slowly increased his activity level and this morning,  he walked for 15 minutes.   MEDICATIONS:  1. Carvedilol 25 mg b.i.d.  2. Crestor 40 mg daily.  3. Aspirin 325 mg daily.  4. Lisinopril 10 mg daily.   ALLERGIES:  NKDA.   PHYSICAL EXAMINATION:  GENERAL:  Nicholas Alvarez is alert and oriented.  He  is in no acute distress.  VITAL SIGNS:  Weight is 244 pounds, blood pressure is 110/73 in Nicholas  right arm, 102/60 in Nicholas left arm, heart rate 76, respiratory rate 12.  HEENT:  Normal.  NECK:  Normal carotid upstrokes, no bruits.  JVP normal.  LUNGS:  Clear  bilaterally.  HEART:  Regular rate and rhythm.  No murmurs or gallops.  ABDOMEN:  Soft, obese, nontender, no organomegaly.  CHEST:  Nicholas sternotomy scar is healing well.  EXTREMITIES:  There is trace pretibial edema bilaterally.  Peripheral  pulses are intact and equal.  SKIN:  Warm and dry without rash.   EKG shows normal sinus rhythm with anteroseptal MI and marked  anteroseptal T-wave changes.   ASSESSMENT:  1. Coronary artery disease status post anterior wall myocardial      infarction and five-vessel coronary bypass.  2. 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  3. Dyslipidemia.   PLAN:  Nicholas Alvarez is recovering well from coronary bypass surgery.  His  blood pressure is well controlled.  He is having no angina.  Medication  cost is an issue and I have changed him from Crestor to Zocor 40 mg  daily to reduce cost.  He will have followup lipids and LFTs in 8 weeks.  I am  going to check an echocardiogram also in 8 weeks to see if he has  had significant LV recovery after revascularization.  In Nicholas meantime,  he should continue on his current doses of Coreg and lisinopril.  No  changes were made to his medical regimen today.   As above, I will follow up with Nicholas Alvarez after his echocardiogram and  lab work are completed in 8 weeks.  Nicholas Alvarez is to see Dr. Tyrone Sage  later this week to be cleared from surgical standpoint.     Veverly Fells. Excell Seltzer, MD  Electronically Signed    MDC/MedQ  DD: 04/02/2008  DT: 04/03/2008  Job #: [id number]

## 2010-07-22 NOTE — Consult Note (Signed)
NAMESILVIANO, NEUSER NO.:  [id number]   MEDICAL RECORD NO.:  [id number]          PATIENT TYPE:  INP   LOCATION:  3312                         FACILITY:  [facility]   PHYSICIAN:  Evelene Croon, M.D.     DATE OF BIRTH:  08/01/1940   DATE OF CONSULTATION:  03/08/2008  DATE OF DISCHARGE:                                 CONSULTATION   REFERRING PHYSICIAN:  Veverly Fells. Excell Seltzer, MD.   REASON FOR HOSPITALIZATION:  Severe three-vessel coronary disease,  status post anterior ST-segment elevation MI.   CLINICAL HISTORY:  I was asked by Dr. Tonny Bollman to evaluate Mr.  Havens for consideration of coronary artery bypass graft surgery.  He is  a 70 year old gentleman with no prior cardiac history who has not seen a  doctor in years and began having severe substernal chest pain last  Wednesday night.  This pain was rated at 10/10 and radiated into both  arms.  This continued all day, Thursday, Friday, and Saturday and  finally stopped on Sunday.  He took 2 aspirins on Thursday and 1 on  Saturday.  This was associated with nausea and anorexia.  He did not  seek medical attention and doctor did not think this had anything to do  with his heart.  [facility] and had  an EKG that showed ST segment elevation consistent with myocardial  infarction.  He was admitted to [facility] with the  diagnosis of recent acute anterior MI.  His troponin level on  presentation was 20.  His CPK was 240.  Electrocardiogram on  presentation showed Q waves throughout the precordial leads with ST  elevation and leads V2 through V5.  He remained free of chest pain since  Sunday and underwent cardiac catheterization yesterday at [facility].  This showed severe three-vessel disease.  The proximal LAD  had a 99% stenosis associated with a large filling defect consistent  with thrombus.  There was TIMI grade 3 flow down the vessel.  There was  also  about 80% mid LAD stenosis.  The left circumflex with a large  vessel had 80% stenosis.  There was a large first obtuse marginal had  about 70% stenosis.  There was a moderate size second marginal and a  large third marginal.  The second and third marginal branches were  compromised by 80% stenosis.  The right coronary artery had about 50%  mid vessel narrowing.  This is a large dominant vessel.  The posterior  descending had 99% distal stenosis at near the apex.  Left ventricular  ejection fraction about 35% with anterolateral dyskinesis and severe  apical hypokinesis.  EF was estimated at 35%.   REVIEW OF SYSTEMS:  His review of systems is as follows:  GENERAL:  He denies any fever or chills.  He has had no recent weight  changes.  He does report fatigue for the past 2 years.  EYES:  Negative.  ENT:  Negative.  ENDOCRINE:  He denies diabetes and hypothyroidism.  CARDIOVASCULAR:  As above.  He  denies any exertional dyspnea.  He has  had no PND or orthopnea.  He denies palpitations and denies peripheral  edema.  RESPIRATORY:  He denies cough and sputum production.  GI:  He  did have some nausea associated with his chest pain.  He denies melena  and bright red blood per rectum.  GU:  He denies dysuria and hematuria.  He does have a history of prostatitis in the past.  MUSCULOSKELETAL:  He  denies arthralgias and myalgias.  NEUROLOGICAL:  He denies any focal  weakness or numbness.  He denies dizziness and syncope.  He has never  had a TIA or stroke.  PSYCHIATRIC:  Negative.  HEMATOLOGICAL:  Negative.   ALLERGIES:  None.   PAST MEDICAL HISTORY:  Significant for history of prostatitis.  He has  not seen doctor in years and said that his wife is afraid of doctors and  has encouraged him not to see a doctor.  He has never had any prior  surgery.   MEDICATIONS:  None.   SOCIAL HISTORY:  He smoked 1 pack per day for 25 years, but quit about  20 years ago.  He drinks occasional alcohol.   Denies any drug use.  He  is married and lives with his wife.   FAMILY HISTORY:  Positive for cardiac disease.  He had a sister who had  myocardial infarction at age 54.  Two grandfathers died of myocardial  infarctions at 54s.  His mother died of COPD and his father died of  pneumonia.  His 1 sister has cancer.   PHYSICAL EXAMINATION:  VITAL SIGNS:  His blood pressure 139/86, pulse is  77 and regular, and respiratory rate is 18 and nonlabored.  GENERAL:  He is a well-developed mildly obese white male in no distress.  HEENT:  Normocephalic and atraumatic.  Pupils are equal and reactive to  light and accommodation.  Extraocular muscles are intact.  His throat is  clear.  NECK:  Normal carotid pulses bilaterally.  There are no bruits.  There  is no adenopathy or thyromegaly.  CARDIAC:  Regular rate and rhythm with normal S1 and S2.  There is no  murmur, rub, or gallop.  LUNGS:  Clear.  ABDOMEN:  Active bowel sounds.  His abdomen is soft, obese, and  nontender.  There are no palpable masses or organomegaly.  EXTREMITIES:  No peripheral edema.  Pedal pulses are palpable  bilaterally.  SKIN:  Warm and dry.  NEUROLOGIC:  Alert and oriented x3.  Motor and sensory exams grossly  normal.   Electrocardiogram today shows normal sinus rhythm with recent  anteroseptal infarct with Q waves in leads V1, V2, V3, and V4.  There is  mild ST elevation across precordium.   LABORATORY EXAMINATION:  Shows normal electrolytes with BUN of 10,  creatinine of 1.0, glucose of 107.  His white blood cell count is 9.4,  hemoglobin 13.5, and platelet count 266,000.  Coagulation profile is  within normal limits.  Liver function profile is within normal limits.  Albumin is 2.7.  Cardiac enzymes have not been done here at [facility]  His cholesterol is 173 with LDL of 120, HDL low at 23, triglycerides  149.  His BNP was 532.  Hemoglobin A1c was 5.9.  His TSH level is still  pending.  As mentioned above, his  troponin I level was 21.4 in [facility] on presentation and his CPK was 251 with an MB of 3.9.   IMPRESSION:  Mr. Farrel has severe three-vessel coronary disease, status  post acute anterior myocardial infarction last week over a 3- or 4-day  period.  He has moderate left ventricular dysfunction.  He is now chest  pain free since Sunday.  He still has significant myocardial risk due to  his 3-vessel disease, and I agree that coronary artery bypass graft  surgery is the best long-term treatment for him to prevent further  ischemia and infarction.  I discussed the operative procedure with him  including alternatives, benefits, and risks including, but not limited  to bleeding, blood transfusion, infection, stroke, myocardial  infarction, graft failure, and death.  He understands and elected to  proceed with surgery.  I told him that I would schedule this for Monday  with Dr. Sheliah Plane, since my schedule is full on Monday.  He is in  agreement with that.      Evelene Croon, M.D.  Electronically Signed     BB/MEDQ  D:  03/08/2008  T:  03/09/2008  Job:  [id number]

## 2010-08-25 ENCOUNTER — Encounter: Payer: Self-pay | Admitting: Cardiovascular Disease

## 2010-12-12 LAB — COMPREHENSIVE METABOLIC PANEL
Alkaline Phosphatase: 94 U/L (ref 39–117)
BUN: 10 mg/dL (ref 6–23)
CO2: 27 mEq/L (ref 19–32)
Chloride: 104 mEq/L (ref 96–112)
Creatinine, Ser: 1 mg/dL (ref 0.4–1.5)
GFR calc non Af Amer: >60 mL/min (ref >60)
Glucose, Bld: 107 mg/dL — ABNORMAL HIGH (ref 70–99)
Potassium: 3.9 mEq/L (ref 3.5–5.1)
Total Bilirubin: 0.5 mg/dL (ref 0.3–1.2)

## 2010-12-12 LAB — PROTIME-INR: INR: 1 (ref 0.00–1.49)

## 2010-12-12 LAB — HEPARIN LEVEL (UNFRACTIONATED): Heparin Unfractionated: 0.1 IU/mL — ABNORMAL LOW (ref 0.30–0.70)

## 2010-12-12 LAB — CBC
HCT: 39.9 % (ref 39.0–52.0)
Hemoglobin: 13.5 g/dL (ref 13.0–17.0)
MCV: 85.8 fL (ref 78.0–100.0)
RBC: 4.65 MIL/uL (ref 4.22–5.81)
WBC: 9.4 10*3/uL (ref 4.0–10.5)

## 2010-12-12 LAB — LIPID PANEL
LDL Cholesterol: 120 mg/dL — ABNORMAL HIGH (ref 0–99)
Total CHOL/HDL Ratio: 7.5 RATIO
Triglycerides: 149 mg/dL (ref <150)
VLDL: 30 mg/dL (ref 0–40)

## 2010-12-12 LAB — URINALYSIS, ROUTINE W REFLEX MICROSCOPIC
Bilirubin Urine: NEGATIVE
Glucose, UA: NEGATIVE mg/dL
Ketones, ur: NEGATIVE mg/dL
Leukocytes, UA: NEGATIVE
Protein, ur: NEGATIVE mg/dL

## 2010-12-12 LAB — TSH: TSH: 2.182 u[IU]/mL (ref 0.350–4.500)

## 2010-12-12 LAB — APTT: aPTT: 31 seconds (ref 24–37)

## 2010-12-12 LAB — URINE MICROSCOPIC-ADD ON

## 2011-04-02 ENCOUNTER — Emergency Department: Payer: Self-pay | Admitting: Emergency Medicine

## 2012-10-24 ENCOUNTER — Observation Stay: Payer: Self-pay | Admitting: Internal Medicine

## 2012-10-24 LAB — CBC
HCT: 39.7 % — ABNORMAL LOW (ref 40.0–52.0)
MCHC: 34 g/dL (ref 32.0–36.0)
Platelet: 194 10*3/uL (ref 150–440)
RDW: 13.7 % (ref 11.5–14.5)
WBC: 9.2 10*3/uL (ref 3.8–10.6)

## 2012-10-24 LAB — URINALYSIS, COMPLETE
Bilirubin,UR: NEGATIVE
Nitrite: NEGATIVE
Protein: 30
RBC,UR: 195 /HPF (ref 0–5)
Specific Gravity: 1.026 (ref 1.003–1.030)
Squamous Epithelial: <1

## 2012-10-24 LAB — COMPREHENSIVE METABOLIC PANEL
Alkaline Phosphatase: 75 U/L (ref 50–136)
Anion Gap: 7 (ref 7–16)
Bilirubin,Total: 0.2 mg/dL (ref 0.2–1.0)
Calcium, Total: 9.3 mg/dL (ref 8.5–10.1)
EGFR (African American): >60
Glucose: 149 mg/dL — ABNORMAL HIGH (ref 65–99)
Osmolality: 280 (ref 275–301)
Potassium: 3.7 mmol/L (ref 3.5–5.1)
SGPT (ALT): 27 U/L (ref 12–78)
Total Protein: 7 g/dL (ref 6.4–8.2)

## 2012-10-24 LAB — TROPONIN I: Troponin-I: <0.02 ng/mL

## 2012-10-25 LAB — TSH: Thyroid Stimulating Horm: 2.21 u[IU]/mL

## 2012-12-16 ENCOUNTER — Emergency Department: Payer: Self-pay | Admitting: Emergency Medicine

## 2012-12-16 LAB — URINALYSIS, COMPLETE
Bacteria: NONE SEEN
Glucose,UR: NEGATIVE mg/dL (ref 0–75)
Ketone: NEGATIVE
Nitrite: NEGATIVE
Protein: NEGATIVE

## 2012-12-30 DIAGNOSIS — N138 Other obstructive and reflux uropathy: Secondary | ICD-10-CM | POA: Insufficient documentation

## 2012-12-30 DIAGNOSIS — N401 Enlarged prostate with lower urinary tract symptoms: Secondary | ICD-10-CM | POA: Insufficient documentation

## 2012-12-30 DIAGNOSIS — N529 Male erectile dysfunction, unspecified: Secondary | ICD-10-CM | POA: Insufficient documentation

## 2012-12-30 DIAGNOSIS — R972 Elevated prostate specific antigen [PSA]: Secondary | ICD-10-CM | POA: Insufficient documentation

## 2013-10-19 DIAGNOSIS — I10 Essential (primary) hypertension: Secondary | ICD-10-CM | POA: Insufficient documentation

## 2014-06-29 NOTE — H&P (Signed)
PATIENT NAME:  Nicholas Alvarez, Nicholas Alvarez MR#:  [id number] DATE OF BIRTH:  1941/01/15  PRIMARY CARE PHYSICIAN:  Alonna Buckler, MD.  REFERRING EMERGENCY ROOM PHYSICIAN: Minna Antis, MD.   CHIEF COMPLAINT: Right leg, right arm weakness.   HISTORY OF PRESENT ILLNESS: The patient is a 74 year old white male with history of coronary artery disease with previous CABG, history of BPH, hypertension, hyperlipidemia, who reports that he was in his usual state of health today. He went out to eat and in the restaurant where basically started having weakness in his right leg and right arm. He felt that he could not control the upper and lower extremities. His hand started shaking. He took short steps and felt like he did not have balance. He returned back home and continued to have these symptoms. His wife finally convinced him to come to the ER. The patient states that by 3:00 p.m. all of his symptoms have resolved. He otherwise did not have any numbness, did not have any visual difficulties, did not have any slurred speech. The patient reports that he has never had similar symptoms in the past.   PAST MEDICAL HISTORY:  Significant for: 1.  Coronary artery disease with CABG in 2010 with five-vessel bypass.  2.  History of BPH.  3.  Hypertension.  4.  Hyperlipidemia.   PAST SURGICAL HISTORY: Coronary artery disease status post five-vessel bypass.   ALLERGIES: None.   CURRENT MEDICATIONS: Aspirin 325 mg p.o. daily, carvedilol 25 mg one tab p.o. b.i.d., (Dictation Anomaly) chondroitin 1 tab p.o. b.i.d., citalopram 40 daily, finasteride 5 mg q. daily, lisinopril 20 daily, lysine 1000 mg daily, simvastatin 40 at bedtime, Flomax 0.4 daily, vitamin E 1000 international units daily.   SOCIAL HISTORY: Does not smoke. He reports that he was drinking up to five shots of hard liquor up until yesterday when he stopped it, because his wife convinced him not to drink anymore. Denies any drug use.   FAMILY HISTORY: Positive for  hypertension.   REVIEW OF SYSTEMS:  CONSTITUTIONAL: Denies any fevers, fatigue, pain, weight loss or weight gain.  EYES: No blurred or double vision. No pain. No redness. No inflammation. No glaucoma or cataracts.  ENT: No tinnitus. No ear pain. No hearing loss. No difficulty swallowing.  RESPIRATORY: Denies any cough, wheezing, hemoptysis. No COPD. No tuberculosis. No pneumonia.  CARDIOVASCULAR: Denies any chest pain, orthopnea. No edema. No arrhythmia.  GASTROINTESTINAL: No nausea, vomiting, diarrhea. No abdominal pain. No hematemesis. No melena. No ulcers. No guarding. No IBS. No jaundice.  GENITOURINARY: Denies any dysuria or hematuria. Has history of BPH.  ENDOCRINE: Denies any polyuria, nocturia.  HEMOLYMPHATIC: Denies any easy bruisability or bleeding.  SKIN: No acne. No rash. No changes in mole, hair or skin.  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 No gout.  NEUROLOGIC: No numbness. No previous history of CVA, TIA, or seizures.  PSYCHIATRIC: No anxiety. No insomnia. Has some depression.   PHYSICAL EXAMINATION:  VITAL SIGNS: Temperature 97.2, pulse 62, respirations 18, blood pressure 133/64, O2 97% on room air.  GENERAL: A well-developed, well-nourished male in no acute distress.  HEENT: Head atraumatic, normocephalic. Pupils equally round, reactive to light and accommodation. There is no conjunctival pallor. No scleral icterus. Nasal exam shows no drainage or ulceration.  OROPHARYNX: Clear without any exudates.  EAR: No drainage or external lesions.  MOUTH: No exudates.  NECK: Supple and symmetric. No masses. Thyroid midline, not enlarged. No JVD.  RESPIRATORY: Good respiratory effort. Clear  to auscultation bilaterally without any rales, rhonchi, or wheezing.  CARDIOVASCULAR: Regular rate and rhythm. No murmurs, clicks, gallops or heaves.  ABDOMEN: Soft, nontender, nondistended. Positive bowel sounds x4.  GENITOURINARY: Deferred.   MUSCULOSKELETAL: No erythema or swelling.  SKIN: No rash.  LYMPHATICS: No lymph nodes palpable.  VASCULAR: Good DP, PT pulses.  NEUROLOGICAL: Cranial nerves II through XII grossly intact, 5/5 in all four extremities, 2+  reflexes. Babinski's downgoing. Sensation is intact.  PSYCHIATRIC: Not anxious or depressed.   EVALUATIONS: EKG showed normal sinus rhythm. CT scan of the head shows mild atrophy. No acute intracranial abnormality. 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 This appears small and old.   ASSESSMENT AND PLAN: The patient is a 74 year old white male who went to a restaurant and started having some weakness in his legs and arms. Unsteadiness, now resolved.  1.  Likely transient ischemic attack. The patient on aspirin at this time. I will change him to Aggrenox. We will check carotid Dopplers, echocardiogram of the heart. Monitor him on telemetry for any arrhythmias. If his symptoms recur, would get MRI of the brain.  2.  Hypertension. Continue carvedilol.  3.  Hyperlipidemia. Continue simvastatin. We will check a fasting lipid panel in the a.m.  4.  Benign prostatic hypertrophy, on Flomax, which we will continue.  5.  Coronary artery disease. The patient will be on Aggrenox. We will continue Coreg.   MISCELLANEOUS: Will use Lovenox for deep vein thrombosis prophylaxis.   NOTE: 45 minutes spent on the H and P.   ____________________________ Lacie ScottsShreyang H. Allena KatzPatel, MD shp:np D: 10/24/2012 18:24:37 ET T: 10/24/2012 20:34:00 ET JOB#: [id number]  cc: Niti Leisure H. Allena KatzPatel, MD, <Dictator> Charise CarwinSHREYANG H Bucky Grigg MD ELECTRONICALLY SIGNED 10/31/2012 13:11

## 2014-06-29 NOTE — Discharge Summary (Signed)
PATIENT NAME:  Nicholas Alvarez, Nicholas Alvarez MR#:  [id number] DATE OF BIRTH:  [birth year]  ADMITTING PHYSICIAN: Dr. Dustin Alvarez. Date of Admission- 10/24/12  DISCHARGING PHYSICIAN: Dr. Gladstone Alvarez. Date of discharge 10/25/12  PRIMARY CARE PHYSICIAN: Dr. Apolonio Alvarez.   [place]: None.   DISCHARGE DIAGNOSES:  1.  Transient ischemic attack with right-sided symptoms, which are completely resolved now.  2.  Hypertension.  3.  Hyperlipidemia.  4.  Coronary artery disease.  5.  Benign prostatic hypertrophy.   DISCHARGE HOME MEDICATIONS:  1.  Lysine 1000 mg p.o. daily.  2.  Chondroitin glucosamine 200 mg/250 mg tablet one tablet p.o. twice a day.  3.  Vitamin E 1000 international units p.o. daily.  4.  Flomax 0.4 mg p.o. daily.  5.  Coreg 25 mg p.o. b.i.d.  6.  Celexa 40 mg p.o. daily.  7.  Simvastatin 40 mg p.o. at bedtime.  8.  Finasteride 5 mg p.o. daily.  9.  Lisinopril 20 mg p.o. daily.  10.  Aggrenox 1 tablet p.o. b.i.d.   DISCHARGE DIET: Low-sodium diet.   DISCHARGE ACTIVITY: As tolerated.    FOLLOWUP INSTRUCTIONS:  1.  PCP followup in 1 to 2 weeks.  2.  Advised to stop using hard liquor at home.   LABORATORIES AND IMAGING STUDIES PRIOR TO DISCHARGE:  1.  WBC 9.3, hemoglobin 13.5, hematocrit 39.7, platelet count 194.  2.  Sodium 138, potassium 3.7, chloride 107, bicarbonate 24, BUN 16, creatinine 0.91, glucose 149, and calcium of 9.3.  3.  ALT 27, AST 27, alk phos 75, total bilirubin 0.2, albumin of 3.4. Troponins negative.  4.  Urinalysis negative for any infection as no bacteria was seen though few WBCs and leuk esterase stress is positive.  5.  CT of the head showing no acute intracranial abnormality. Mild atrophy, which is normal for the patient's age, is seen and small and old infarcts are present.  6.  LDL 61, HDL 36, total cholesterol 128, and triglycerides 153.  7.  Ultrasound carotids bilaterally showing no hemodynamically significant stenosis.  8.   Echo Doppler is done and the official result is pending at this time.   BRIEF HOSPITAL COURSE: [name] is a 74 year old Caucasian male with past medical history significant for coronary artery disease, hypertension, hyperlipidemia, who was in his normal state of health, and presented to the hospital after he had a transient episode of feeling funny weak and weak in the right arm and also leg. Denies any other associated visual changes, tingling, numbness on the other side,  or slurred speech. Never had similar symptoms in the past. Was admitted under diagnosis of TIA.   ASSESSMENT AND PLAN:  1.  Transient ischemic attacks. CT of the head did not show any acute changes and the patient's symptoms completely resolved by the time he came to the hospital and had not had recurrence of symptoms. His MRI was not done. Neuro checks were stable. Carotid Dopplers did not show any hemodynamically significant stenosis. Echo preliminary report seems normal. The patient's aspirin was changed to Aggrenox. At the time of discharge, blood pressure was very well controlled. He was advised to follow up with his primary care physician. He is already on a statin.   Also of note, the patient used to drink a lot of hard liquor and just stopped a few days prior to the onset of symptoms.   2.  Hypertension. Home medications Coreg and lisinopril were continued.   3.  Hyperlipidemia. The patient on statin.   4.  History of coronary artery disease. The patient is on aspirin, statin, lisinopril, and Coreg. No active cardiac symptoms and his troponins were negative in the hospital.   5.  His course has been otherwise uneventful in the hospital.   DISCHARGE CONDITION: Stable.   DISCHARGE DISPOSITION: Home.   TIME SPENT ON DISCHARGE: 45 minutes.    ____________________________ Nicholas Lighter, MD rk:np D: 10/25/2012 14:53:21 ET T: 10/25/2012 20:28:47 ET JOB#: [id number]  cc: Nicholas Lighter, MD, <Dictator> Nicholas Alvarez. Arline Asp, MD  Nicholas Lighter MD ELECTRONICALLY SIGNED 11/18/2012 15:45

## 2019-04-25 DIAGNOSIS — F3341 Major depressive disorder, recurrent, in partial remission: Secondary | ICD-10-CM | POA: Insufficient documentation

## 2019-12-05 ENCOUNTER — Encounter: Payer: Self-pay | Admitting: *Deleted

## 2019-12-05 ENCOUNTER — Emergency Department: Payer: Medicare Other

## 2019-12-05 ENCOUNTER — Other Ambulatory Visit: Payer: Self-pay

## 2019-12-05 DIAGNOSIS — N401 Enlarged prostate with lower urinary tract symptoms: Secondary | ICD-10-CM | POA: Diagnosis present

## 2019-12-05 DIAGNOSIS — I252 Old myocardial infarction: Secondary | ICD-10-CM

## 2019-12-05 DIAGNOSIS — F32A Depression, unspecified: Secondary | ICD-10-CM | POA: Diagnosis present

## 2019-12-05 DIAGNOSIS — J189 Pneumonia, unspecified organism: Principal | ICD-10-CM | POA: Diagnosis present

## 2019-12-05 DIAGNOSIS — E876 Hypokalemia: Secondary | ICD-10-CM | POA: Diagnosis present

## 2019-12-05 DIAGNOSIS — Z8249 Family history of ischemic heart disease and other diseases of the circulatory system: Secondary | ICD-10-CM

## 2019-12-05 DIAGNOSIS — I2510 Atherosclerotic heart disease of native coronary artery without angina pectoris: Secondary | ICD-10-CM | POA: Diagnosis present

## 2019-12-05 DIAGNOSIS — I255 Ischemic cardiomyopathy: Secondary | ICD-10-CM | POA: Diagnosis present

## 2019-12-05 DIAGNOSIS — I959 Hypotension, unspecified: Secondary | ICD-10-CM | POA: Diagnosis present

## 2019-12-05 DIAGNOSIS — Z7902 Long term (current) use of antithrombotics/antiplatelets: Secondary | ICD-10-CM

## 2019-12-05 DIAGNOSIS — Z87891 Personal history of nicotine dependence: Secondary | ICD-10-CM

## 2019-12-05 DIAGNOSIS — K5900 Constipation, unspecified: Secondary | ICD-10-CM | POA: Diagnosis present

## 2019-12-05 DIAGNOSIS — Z79899 Other long term (current) drug therapy: Secondary | ICD-10-CM

## 2019-12-05 DIAGNOSIS — I10 Essential (primary) hypertension: Secondary | ICD-10-CM | POA: Diagnosis present

## 2019-12-05 DIAGNOSIS — Z20822 Contact with and (suspected) exposure to covid-19: Secondary | ICD-10-CM | POA: Diagnosis present

## 2019-12-05 DIAGNOSIS — J9601 Acute respiratory failure with hypoxia: Secondary | ICD-10-CM | POA: Diagnosis present

## 2019-12-05 DIAGNOSIS — Z951 Presence of aortocoronary bypass graft: Secondary | ICD-10-CM

## 2019-12-05 DIAGNOSIS — R197 Diarrhea, unspecified: Secondary | ICD-10-CM | POA: Diagnosis not present

## 2019-12-05 DIAGNOSIS — Z6822 Body mass index (BMI) 22.0-22.9, adult: Secondary | ICD-10-CM

## 2019-12-05 DIAGNOSIS — D509 Iron deficiency anemia, unspecified: Secondary | ICD-10-CM | POA: Diagnosis present

## 2019-12-05 DIAGNOSIS — D638 Anemia in other chronic diseases classified elsewhere: Secondary | ICD-10-CM | POA: Diagnosis present

## 2019-12-05 DIAGNOSIS — U099 Post covid-19 condition, unspecified: Secondary | ICD-10-CM | POA: Diagnosis present

## 2019-12-05 DIAGNOSIS — R338 Other retention of urine: Secondary | ICD-10-CM | POA: Diagnosis not present

## 2019-12-05 DIAGNOSIS — E43 Unspecified severe protein-calorie malnutrition: Secondary | ICD-10-CM | POA: Diagnosis present

## 2019-12-05 DIAGNOSIS — R339 Retention of urine, unspecified: Secondary | ICD-10-CM | POA: Diagnosis present

## 2019-12-05 DIAGNOSIS — Z7982 Long term (current) use of aspirin: Secondary | ICD-10-CM

## 2019-12-05 DIAGNOSIS — E782 Mixed hyperlipidemia: Secondary | ICD-10-CM | POA: Diagnosis present

## 2019-12-05 LAB — URINALYSIS, COMPLETE (UACMP) WITH MICROSCOPIC
Bacteria, UA: NONE SEEN
Bilirubin Urine: NEGATIVE
Glucose, UA: NEGATIVE mg/dL
Hgb urine dipstick: NEGATIVE
Ketones, ur: 5 mg/dL — AB
Leukocytes,Ua: NEGATIVE
Nitrite: NEGATIVE
Protein, ur: NEGATIVE mg/dL
Specific Gravity, Urine: 1.011 (ref 1.005–1.030)
pH: 5 (ref 5.0–8.0)

## 2019-12-05 LAB — CBC
HCT: 32.7 % — ABNORMAL LOW (ref 39.0–52.0)
Hemoglobin: 10.1 g/dL — ABNORMAL LOW (ref 13.0–17.0)
MCH: 25.9 pg — ABNORMAL LOW (ref 26.0–34.0)
MCHC: 30.9 g/dL (ref 30.0–36.0)
MCV: 83.8 fL (ref 80.0–100.0)
Platelets: 503 10*3/uL — ABNORMAL HIGH (ref 150–400)
RBC: 3.9 MIL/uL — ABNORMAL LOW (ref 4.22–5.81)
RDW: 17 % — ABNORMAL HIGH (ref 11.5–15.5)
WBC: 18.6 10*3/uL — ABNORMAL HIGH (ref 4.0–10.5)
nRBC: 0 % (ref 0.0–0.2)

## 2019-12-05 LAB — BASIC METABOLIC PANEL
Anion gap: 12 (ref 5–15)
BUN: 13 mg/dL (ref 8–23)
CO2: 23 mmol/L (ref 22–32)
Calcium: 9.6 mg/dL (ref 8.9–10.3)
Chloride: 100 mmol/L (ref 98–111)
Creatinine, Ser: 0.74 mg/dL (ref 0.61–1.24)
GFR calc Af Amer: >60 mL/min (ref >60)
GFR calc non Af Amer: >60 mL/min (ref >60)
Glucose, Bld: 103 mg/dL — ABNORMAL HIGH (ref 70–99)
Potassium: 4.1 mmol/L (ref 3.5–5.1)
Sodium: 135 mmol/L (ref 135–145)

## 2019-12-05 LAB — TROPONIN I (HIGH SENSITIVITY): Troponin I (High Sensitivity): 13 ng/L (ref <18)

## 2019-12-05 NOTE — ED Triage Notes (Signed)
Pt comes into the ED via EMS from home with c/o recent pneumonia and not getting any better saw PCP today who called back and advised the pt to comes to the ED for treatment. WBC 18.4 pt is a/ox4.

## 2019-12-05 NOTE — ED Triage Notes (Signed)
Pt brought in via ems from home.  Pt reports he saw his doctor today and they called and told him to come to hospital for eval of high wbc and recent pneumonia.  Pt alert.

## 2019-12-06 ENCOUNTER — Inpatient Hospital Stay
Admission: EM | Admit: 2019-12-06 | Discharge: 2019-12-11 | DRG: 193 | Disposition: A | Discharge status: Home Health | Payer: Medicare Other | Attending: Family Medicine | Admitting: Family Medicine

## 2019-12-06 ENCOUNTER — Encounter: Payer: Self-pay | Admitting: Family Medicine

## 2019-12-06 DIAGNOSIS — D638 Anemia in other chronic diseases classified elsewhere: Secondary | ICD-10-CM | POA: Diagnosis present

## 2019-12-06 DIAGNOSIS — E876 Hypokalemia: Secondary | ICD-10-CM | POA: Diagnosis present

## 2019-12-06 DIAGNOSIS — Z7902 Long term (current) use of antithrombotics/antiplatelets: Secondary | ICD-10-CM | POA: Diagnosis not present

## 2019-12-06 DIAGNOSIS — Z7982 Long term (current) use of aspirin: Secondary | ICD-10-CM | POA: Diagnosis not present

## 2019-12-06 DIAGNOSIS — J9601 Acute respiratory failure with hypoxia: Secondary | ICD-10-CM | POA: Diagnosis present

## 2019-12-06 DIAGNOSIS — I2510 Atherosclerotic heart disease of native coronary artery without angina pectoris: Secondary | ICD-10-CM | POA: Diagnosis present

## 2019-12-06 DIAGNOSIS — N401 Enlarged prostate with lower urinary tract symptoms: Secondary | ICD-10-CM | POA: Diagnosis present

## 2019-12-06 DIAGNOSIS — Z20822 Contact with and (suspected) exposure to covid-19: Secondary | ICD-10-CM | POA: Diagnosis present

## 2019-12-06 DIAGNOSIS — U071 COVID-19: Secondary | ICD-10-CM | POA: Diagnosis present

## 2019-12-06 DIAGNOSIS — K5900 Constipation, unspecified: Secondary | ICD-10-CM | POA: Diagnosis present

## 2019-12-06 DIAGNOSIS — I25810 Atherosclerosis of coronary artery bypass graft(s) without angina pectoris: Secondary | ICD-10-CM | POA: Diagnosis present

## 2019-12-06 DIAGNOSIS — J189 Pneumonia, unspecified organism: Secondary | ICD-10-CM | POA: Diagnosis present

## 2019-12-06 DIAGNOSIS — Z6822 Body mass index (BMI) 22.0-22.9, adult: Secondary | ICD-10-CM | POA: Diagnosis not present

## 2019-12-06 DIAGNOSIS — E785 Hyperlipidemia, unspecified: Secondary | ICD-10-CM | POA: Diagnosis not present

## 2019-12-06 DIAGNOSIS — D509 Iron deficiency anemia, unspecified: Secondary | ICD-10-CM | POA: Diagnosis present

## 2019-12-06 DIAGNOSIS — U099 Post covid-19 condition, unspecified: Secondary | ICD-10-CM | POA: Diagnosis present

## 2019-12-06 DIAGNOSIS — I10 Essential (primary) hypertension: Secondary | ICD-10-CM | POA: Diagnosis not present

## 2019-12-06 DIAGNOSIS — I959 Hypotension, unspecified: Secondary | ICD-10-CM | POA: Diagnosis present

## 2019-12-06 DIAGNOSIS — Z79899 Other long term (current) drug therapy: Secondary | ICD-10-CM | POA: Diagnosis not present

## 2019-12-06 DIAGNOSIS — F32A Depression, unspecified: Secondary | ICD-10-CM | POA: Diagnosis present

## 2019-12-06 DIAGNOSIS — R338 Other retention of urine: Secondary | ICD-10-CM | POA: Diagnosis not present

## 2019-12-06 DIAGNOSIS — R197 Diarrhea, unspecified: Secondary | ICD-10-CM | POA: Diagnosis not present

## 2019-12-06 DIAGNOSIS — E782 Mixed hyperlipidemia: Secondary | ICD-10-CM | POA: Diagnosis present

## 2019-12-06 DIAGNOSIS — E43 Unspecified severe protein-calorie malnutrition: Secondary | ICD-10-CM | POA: Diagnosis present

## 2019-12-06 DIAGNOSIS — I255 Ischemic cardiomyopathy: Secondary | ICD-10-CM | POA: Diagnosis present

## 2019-12-06 DIAGNOSIS — I252 Old myocardial infarction: Secondary | ICD-10-CM | POA: Diagnosis not present

## 2019-12-06 DIAGNOSIS — R339 Retention of urine, unspecified: Secondary | ICD-10-CM | POA: Diagnosis present

## 2019-12-06 HISTORY — DX: Depression, unspecified: F32.A

## 2019-12-06 HISTORY — DX: COVID-19: U07.1

## 2019-12-06 HISTORY — DX: Benign prostatic hyperplasia without lower urinary tract symptoms: N40.0

## 2019-12-06 LAB — CBC WITH DIFFERENTIAL/PLATELET
Abs Immature Granulocytes: 0.12 10*3/uL — ABNORMAL HIGH (ref 0.00–0.07)
Basophils Absolute: 0 10*3/uL (ref 0.0–0.1)
Basophils Relative: 0 %
Eosinophils Absolute: 0.1 10*3/uL (ref 0.0–0.5)
Eosinophils Relative: 1 %
HCT: 34.2 % — ABNORMAL LOW (ref 39.0–52.0)
Hemoglobin: 10.4 g/dL — ABNORMAL LOW (ref 13.0–17.0)
Immature Granulocytes: 1 %
Lymphocytes Relative: 14 %
Lymphs Abs: 1.9 10*3/uL (ref 0.7–4.0)
MCH: 25.9 pg — ABNORMAL LOW (ref 26.0–34.0)
MCHC: 30.4 g/dL (ref 30.0–36.0)
MCV: 85.1 fL (ref 80.0–100.0)
Monocytes Absolute: 1 10*3/uL (ref 0.1–1.0)
Monocytes Relative: 7 %
Neutro Abs: 10.8 10*3/uL — ABNORMAL HIGH (ref 1.7–7.7)
Neutrophils Relative %: 77 %
Platelets: 452 10*3/uL — ABNORMAL HIGH (ref 150–400)
RBC: 4.02 MIL/uL — ABNORMAL LOW (ref 4.22–5.81)
RDW: 17 % — ABNORMAL HIGH (ref 11.5–15.5)
WBC: 13.9 10*3/uL — ABNORMAL HIGH (ref 4.0–10.5)
nRBC: 0 % (ref 0.0–0.2)

## 2019-12-06 LAB — PROCALCITONIN
Procalcitonin: 0.14 ng/mL
Procalcitonin: 0.1 ng/mL

## 2019-12-06 LAB — LACTIC ACID, PLASMA
Lactic Acid, Venous: 1.9 mmol/L (ref 0.5–1.9)
Lactic Acid, Venous: 2.1 mmol/L (ref 0.5–1.9)

## 2019-12-06 LAB — STREP PNEUMONIAE URINARY ANTIGEN: Strep Pneumo Urinary Antigen: NEGATIVE

## 2019-12-06 LAB — TROPONIN I (HIGH SENSITIVITY): Troponin I (High Sensitivity): 11 ng/L (ref <18)

## 2019-12-06 LAB — RESPIRATORY PANEL BY RT PCR (FLU A&B, COVID)
Influenza A by PCR: NEGATIVE
Influenza B by PCR: NEGATIVE
SARS Coronavirus 2 by RT PCR: NEGATIVE

## 2019-12-06 MED ORDER — MONTELUKAST SODIUM 10 MG PO TABS
10.0000 mg | ORAL_TABLET | Freq: Every day | ORAL | Status: DC
  Administered 2019-12-06 – 2019-12-11 (×6): 10 mg via ORAL
  Filled 2019-12-06 (×7): qty 1

## 2019-12-06 MED ORDER — SODIUM CHLORIDE 0.9 % IV SOLN
2.0000 g | INTRAVENOUS | Status: DC
  Administered 2019-12-07 – 2019-12-09 (×3): 2 g via INTRAVENOUS
  Filled 2019-12-06 (×3): qty 20
  Filled 2019-12-06: qty 2
  Filled 2019-12-06: qty 20

## 2019-12-06 MED ORDER — MAGNESIUM HYDROXIDE 400 MG/5ML PO SUSP
30.0000 mL | Freq: Every day | ORAL | Status: DC | PRN
  Administered 2019-12-07: 30 mL via ORAL
  Filled 2019-12-06: qty 30

## 2019-12-06 MED ORDER — OMEGA-3-ACID ETHYL ESTERS 1 G PO CAPS
1000.0000 mg | ORAL_CAPSULE | Freq: Every day | ORAL | Status: DC
  Administered 2019-12-06 – 2019-12-11 (×6): 1000 mg via ORAL
  Filled 2019-12-06 (×7): qty 1

## 2019-12-06 MED ORDER — PREDNISONE 20 MG PO TABS: 50.0000 mg | ORAL_TABLET | Freq: Every day | ORAL | Status: DC

## 2019-12-06 MED ORDER — SODIUM CHLORIDE 0.9 % IV SOLN
2.0000 g | Freq: Once | INTRAVENOUS | Status: AC
  Administered 2019-12-06: 2 g via INTRAVENOUS
  Filled 2019-12-06: qty 20

## 2019-12-06 MED ORDER — SODIUM CHLORIDE 0.9 % IV SOLN: 100.0000 mg | Freq: Every day | INTRAVENOUS | Status: DC

## 2019-12-06 MED ORDER — VITAMIN D 25 MCG (1000 UNIT) PO TABS
1000.0000 [IU] | ORAL_TABLET | Freq: Every day | ORAL | Status: DC
  Administered 2019-12-06 – 2019-12-11 (×6): 1000 [IU] via ORAL
  Filled 2019-12-06 (×7): qty 1

## 2019-12-06 MED ORDER — ACETAMINOPHEN 325 MG PO TABS
650.0000 mg | ORAL_TABLET | Freq: Four times a day (QID) | ORAL | Status: DC | PRN
  Administered 2019-12-08 – 2019-12-09 (×2): 650 mg via ORAL
  Filled 2019-12-06 (×2): qty 2

## 2019-12-06 MED ORDER — MIDODRINE HCL 5 MG PO TABS
5.0000 mg | ORAL_TABLET | Freq: Three times a day (TID) | ORAL | Status: DC
  Administered 2019-12-06 – 2019-12-11 (×14): 5 mg via ORAL
  Filled 2019-12-06 (×18): qty 1

## 2019-12-06 MED ORDER — METHYLPREDNISOLONE SODIUM SUCC 125 MG IJ SOLR: 1.0000 mg/kg | Freq: Two times a day (BID) | INTRAMUSCULAR | Status: DC

## 2019-12-06 MED ORDER — FLUTICASONE PROPIONATE 50 MCG/ACT NA SUSP
1.0000 | Freq: Two times a day (BID) | NASAL | Status: DC | PRN
  Filled 2019-12-06: qty 16

## 2019-12-06 MED ORDER — MEGESTROL ACETATE 20 MG PO TABS
40.0000 mg | ORAL_TABLET | Freq: Every day | ORAL | Status: DC
  Administered 2019-12-06 – 2019-12-11 (×6): 40 mg via ORAL
  Filled 2019-12-06 (×7): qty 2

## 2019-12-06 MED ORDER — FAMOTIDINE 20 MG PO TABS
20.0000 mg | ORAL_TABLET | Freq: Two times a day (BID) | ORAL | Status: DC
  Administered 2019-12-06 – 2019-12-11 (×11): 20 mg via ORAL
  Filled 2019-12-06 (×11): qty 1

## 2019-12-06 MED ORDER — FINASTERIDE 5 MG PO TABS
5.0000 mg | ORAL_TABLET | Freq: Every day | ORAL | Status: DC
  Administered 2019-12-06 – 2019-12-11 (×6): 5 mg via ORAL
  Filled 2019-12-06 (×7): qty 1

## 2019-12-06 MED ORDER — ZINC SULFATE 220 (50 ZN) MG PO CAPS: 220.0000 mg | ORAL_CAPSULE | Freq: Every day | ORAL | Status: DC

## 2019-12-06 MED ORDER — GUAIFENESIN ER 600 MG PO TB12
600.0000 mg | ORAL_TABLET | Freq: Two times a day (BID) | ORAL | Status: DC
  Administered 2019-12-06 – 2019-12-11 (×11): 600 mg via ORAL
  Filled 2019-12-06 (×11): qty 1

## 2019-12-06 MED ORDER — ASCORBIC ACID 500 MG PO TABS
500.0000 mg | ORAL_TABLET | Freq: Every day | ORAL | Status: DC
  Administered 2019-12-06 – 2019-12-10 (×5): 500 mg via ORAL
  Filled 2019-12-06 (×5): qty 1

## 2019-12-06 MED ORDER — TAMSULOSIN HCL 0.4 MG PO CAPS
0.4000 mg | ORAL_CAPSULE | Freq: Every day | ORAL | Status: DC
  Administered 2019-12-06 – 2019-12-11 (×6): 0.4 mg via ORAL
  Filled 2019-12-06 (×6): qty 1

## 2019-12-06 MED ORDER — HYDROCOD POLST-CPM POLST ER 10-8 MG/5ML PO SUER: 5.0000 mL | Freq: Two times a day (BID) | ORAL | Status: DC | PRN

## 2019-12-06 MED ORDER — ASPIRIN EC 325 MG PO TBEC
325.0000 mg | DELAYED_RELEASE_TABLET | Freq: Every day | ORAL | Status: DC
  Administered 2019-12-06 – 2019-12-11 (×6): 325 mg via ORAL
  Filled 2019-12-06 (×7): qty 1

## 2019-12-06 MED ORDER — SIMVASTATIN 40 MG PO TABS
40.0000 mg | ORAL_TABLET | Freq: Every day | ORAL | Status: DC
  Administered 2019-12-07 – 2019-12-10 (×4): 40 mg via ORAL
  Filled 2019-12-06 (×5): qty 1

## 2019-12-06 MED ORDER — SODIUM CHLORIDE 0.9 % IV SOLN
500.0000 mg | Freq: Once | INTRAVENOUS | Status: AC
  Administered 2019-12-06: 500 mg via INTRAVENOUS
  Filled 2019-12-06: qty 500

## 2019-12-06 MED ORDER — ENOXAPARIN SODIUM 40 MG/0.4ML ~~LOC~~ SOLN
40.0000 mg | SUBCUTANEOUS | Status: DC
  Administered 2019-12-06 – 2019-12-11 (×6): 40 mg via SUBCUTANEOUS
  Filled 2019-12-06 (×7): qty 0.4

## 2019-12-06 MED ORDER — SODIUM CHLORIDE 0.9 % IV SOLN: INTRAVENOUS | Status: DC

## 2019-12-06 MED ORDER — BARICITINIB 2 MG PO TABS: 4.0000 mg | ORAL_TABLET | Freq: Every day | ORAL | Status: DC

## 2019-12-06 MED ORDER — BUPROPION HCL ER (XL) 150 MG PO TB24
150.0000 mg | ORAL_TABLET | Freq: Every morning | ORAL | Status: DC
  Administered 2019-12-06 – 2019-12-11 (×6): 150 mg via ORAL
  Filled 2019-12-06 (×6): qty 1

## 2019-12-06 MED ORDER — CARVEDILOL 25 MG PO TABS
25.0000 mg | ORAL_TABLET | Freq: Two times a day (BID) | ORAL | Status: DC
  Filled 2019-12-06: qty 1

## 2019-12-06 MED ORDER — DULOXETINE HCL 30 MG PO CPEP
60.0000 mg | ORAL_CAPSULE | Freq: Every day | ORAL | Status: DC
  Administered 2019-12-06 – 2019-12-11 (×6): 60 mg via ORAL
  Filled 2019-12-06: qty 1
  Filled 2019-12-06 (×5): qty 2

## 2019-12-06 MED ORDER — INFLUENZA VAC A&B SA ADJ QUAD 0.5 ML IM PRSY
0.5000 mL | PREFILLED_SYRINGE | INTRAMUSCULAR | Status: DC | PRN
  Filled 2019-12-06: qty 0.5

## 2019-12-06 MED ORDER — TRAZODONE HCL 50 MG PO TABS
25.0000 mg | ORAL_TABLET | Freq: Every evening | ORAL | Status: DC | PRN
  Administered 2019-12-07 – 2019-12-09 (×2): 25 mg via ORAL
  Filled 2019-12-06 (×4): qty 1

## 2019-12-06 MED ORDER — CLOPIDOGREL BISULFATE 75 MG PO TABS
75.0000 mg | ORAL_TABLET | Freq: Every day | ORAL | Status: DC
  Administered 2019-12-06 – 2019-12-11 (×6): 75 mg via ORAL
  Filled 2019-12-06 (×6): qty 1

## 2019-12-06 MED ORDER — SODIUM CHLORIDE 0.9 % IV SOLN: 500.0000 mg | INTRAVENOUS | Status: DC

## 2019-12-06 MED ORDER — ONDANSETRON HCL 4 MG PO TABS: 4.0000 mg | ORAL_TABLET | Freq: Four times a day (QID) | ORAL | Status: DC | PRN

## 2019-12-06 MED ORDER — MAGNESIUM OXIDE 400 (241.3 MG) MG PO TABS
200.0000 mg | ORAL_TABLET | Freq: Every day | ORAL | Status: DC
  Administered 2019-12-06 – 2019-12-11 (×6): 200 mg via ORAL
  Filled 2019-12-06 (×6): qty 1

## 2019-12-06 MED ORDER — LISINOPRIL 10 MG PO TABS
20.0000 mg | ORAL_TABLET | Freq: Every day | ORAL | Status: DC
  Administered 2019-12-06: 20 mg via ORAL
  Filled 2019-12-06: qty 2

## 2019-12-06 MED ORDER — AZITHROMYCIN 250 MG PO TABS
500.0000 mg | ORAL_TABLET | Freq: Every day | ORAL | Status: AC
  Administered 2019-12-07 – 2019-12-10 (×4): 500 mg via ORAL
  Filled 2019-12-06 (×5): qty 2

## 2019-12-06 MED ORDER — IPRATROPIUM-ALBUTEROL 0.5-2.5 (3) MG/3ML IN SOLN
3.0000 mL | Freq: Four times a day (QID) | RESPIRATORY_TRACT | Status: DC
  Administered 2019-12-06 – 2019-12-08 (×8): 3 mL via RESPIRATORY_TRACT
  Filled 2019-12-06 (×7): qty 3

## 2019-12-06 MED ORDER — SODIUM CHLORIDE 0.9 % IV SOLN: 200.0000 mg | Freq: Once | INTRAVENOUS | Status: DC

## 2019-12-06 MED ORDER — GUAIFENESIN-DM 100-10 MG/5ML PO SYRP
10.0000 mL | ORAL_SOLUTION | ORAL | Status: DC | PRN
  Filled 2019-12-06 (×2): qty 10

## 2019-12-06 MED ORDER — ONDANSETRON HCL 4 MG/2ML IJ SOLN: 4.0000 mg | Freq: Four times a day (QID) | INTRAMUSCULAR | Status: DC | PRN

## 2019-12-06 NOTE — Progress Notes (Addendum)
Same day rounding progress note  Patient waiting for floor bed. No new c/o - BP persistently low but MAP holding fine.  1.  Multifocal community-acquired pneumonia with subsequent acute hypoxic respiratory failure. -continue IV Rocephin and Zithromax empirically. Check procalcitonin to decide need to continue abx -Mucolytic's + bronchodilator therapy. - follow blood and sputum culture. -pending urine pneumoniae antigens. -negative for COVID-19.  2. Hypotension with Essential hypertension. -Hold Coreg, lisinopril and HCTZ. Trial of midodrine and fluids. Patient asymptomatic  3.  Dyslipidemia. -continue statin therapy and fish oil.  4.  Depression. - continue Celexa.  5.  BPH. - continue Proscar.  PT/OT eval  Time spent - 15 mins

## 2019-12-06 NOTE — ED Notes (Signed)
Pt eating meal tray and watching tv. Pt is alert and oriented to self, place, situation, disoriented to time. resps unlabored.

## 2019-12-06 NOTE — ED Notes (Signed)
[place] w/ two sst tops sent to lab for morning labs once admit orders in.

## 2019-12-06 NOTE — ED Notes (Signed)
See triage note, pt reports sent to ED by doctor for increased WBC Pt disoriented to year, not answering all questions appropriately. Pt states "I do get forgetful and say the wrong thing a lot" 86% on RA, placed on 2L [place] RR even and unlabored

## 2019-12-06 NOTE — H&P (Addendum)
[place]   PATIENT NAME: Nicholas Alvarez    MR#:  [id number]  DATE OF BIRTH:  Nov 09, 1940  DATE OF ADMISSION:  12/06/2019  PRIMARY CARE PHYSICIAN: Nadara Mustard, MD  REQUESTING/REFERRING PHYSICIAN: Dionne Bucy, MD  CHIEF COMPLAINT:   Chief Complaint  Patient presents with  . Abnormal Lab    HISTORY OF PRESENT ILLNESS:  Nicholas Alvarez  is a 79 y.o.  male with a known history of coronary artery disease, dyslipidemia, ischemic cardiomyopathy and hypertension, who presented to the emergency room with acute onset of worsening dyspnea with associated dry cough and wheezing with generalized weakness and fatigue as well as body aches.  The patient had a positive Covid test last month and has been treated as an.  He was seen by his primary care physician and had a CBC that showed leukocytosis and a chest x-ray that showed pneumonia and therefore was referred to the emergency room.  The patient denied any nausea or vomiting or diarrhea.  He has been constipated.  He denied any fever or chills.  Upon position to the emergency room, heart rate was 115 with otherwise normal vital signs.  Pulse ox symmetry however was down to the 80s on room air and came up to the high 90s on 2 L of O2 by nasal cannula.  Labs revealed unremarkable CMP.  High-sensitivity troponin I was 13 and later 11.  Lactic acid was 1.9 later 2.1 with procalcitonin 0.14.  CBC showed leukocytosis of 18.6 influenza antigens came back negative.  COVID-19 PCR came back negative.  Chest x-ray showed Patchy heterogeneous bilateral lung opacities have progressed from prior exam, most prominently involving the periphery of the left upper lobe. Findings are suspicious for multifocal pneumonia, including COVID-19.  The patient was given IV Rocephin and Zithromax as well as Reglan.  He will be admitted to a medical monitored bed for further evaluation and management.  PAST MEDICAL HISTORY:   Past Medical History:   Diagnosis Date  . 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 Dyslipidemia   . Ischemic cardiomyopathy    with left ventricular ejection fraction of 35%    PAST SURGICAL HISTORY:    No reported previous surgeries.  SOCIAL HISTORY:   Social History   Tobacco Use  . Smoking status: Former Games developer  . Smokeless tobacco: Never Used  Substance Use Topics  . Alcohol use: Yes    FAMILY HISTORY:   Family History  Problem Relation Age of Onset  . Coronary artery disease Other   . Heart attack Sister 57  . Heart attack Maternal Grandfather 40  . Heart attack Paternal Grandfather 40  . COPD Mother   . Pneumonia Father   . Cancer Sister     DRUG ALLERGIES:  No Known Allergies  REVIEW OF SYSTEMS:   ROS As per history of present illness. All pertinent systems were reviewed above. Constitutional, HEENT, cardiovascular, respiratory, GI, GU, musculoskeletal, neuro, psychiatric, endocrine, integumentary and hematologic systems were reviewed and are otherwise negative/unremarkable except for positive findings mentioned above in the HPI.   MEDICATIONS AT HOME:   Prior to Admission medications   Medication Sig Start Date End Date Taking? Authorizing Provider  aspirin 325 MG EC tablet Take 325 mg by mouth daily.      [provider]  carvedilol (COREG) 25 MG tablet Take 25 mg by mouth 2 (two) times daily with a meal.  [provider]  lisinopril (PRINIVIL,ZESTRIL) 20 MG tablet Take 20 mg by mouth daily.      [provider]  simvastatin (ZOCOR) 40 MG tablet Take 40 mg by mouth at bedtime.      [provider]      VITAL SIGNS:  Blood pressure 107/71, pulse (!) 110, temperature 98 F (36.7 C), temperature source Oral, resp. rate 15, height 5\' 10"  (1.778 m), weight 63.5 kg, SpO2 92 %.  PHYSICAL EXAMINATION:  Physical Exam  GENERAL:  79 y.o.-year-old male patient lying in the  bed with mild respiratory distress with conversational dyspnea.   EYES: Pupils equal, round, reactive to light and accommodation. No scleral icterus. Extraocular muscles intact.  HEENT: Head atraumatic, normocephalic. Oropharynx and nasopharynx clear.  NECK:  Supple, no jugular venous distention. No thyroid enlargement, no tenderness.  LUNGS: Slightly diminished bibasal breath sounds with bibasal crackles. CARDIOVASCULAR: Regular rate and rhythm, S1, S2 normal. No murmurs, rubs, or gallops.  ABDOMEN: Soft, nondistended, nontender. Bowel sounds present. No organomegaly or mass.  EXTREMITIES: No pedal edema, cyanosis, or clubbing.  NEUROLOGIC: Cranial nerves II through XII are intact. Muscle strength 5/5 in all extremities. Sensation intact. Gait not checked.  PSYCHIATRIC: The patient is alert and oriented x 3.  Normal affect and good eye contact. SKIN: No obvious rash, lesion, or ulcer.   LABORATORY PANEL:   CBC Recent Labs  Lab 12/05/19 2001  WBC 18.6*  HGB 10.1*  HCT 32.7*  PLT 503*   ------------------------------------------------------------------------------------------------------------------  Chemistries  Recent Labs  Lab 12/05/19 2001  NA 135  K 4.1  CL 100  CO2 23  GLUCOSE 103*  BUN 13  CREATININE 0.74  CALCIUM 9.6   ------------------------------------------------------------------------------------------------------------------  Cardiac Enzymes No results for input(s): TROPONINI in the last 168 hours. ------------------------------------------------------------------------------------------------------------------  RADIOLOGY:  DG Chest 2 View  Result Date: 12/05/2019 CLINICAL DATA:  Shortness of breath. EXAM: CHEST - 2 VIEW COMPARISON:  Radiograph 10/16/2019 FINDINGS: Patchy heterogeneous bilateral lung opacities have progressed from prior exam, most prominently involving the periphery of the left upper lobe. Post median sternotomy and CABG with normal heart  size. No pneumothorax or pleural effusion. No acute osseous abnormalities are seen. IMPRESSION: Patchy heterogeneous bilateral lung opacities have progressed from prior exam, most prominently involving the periphery of the left upper lobe. Findings are suspicious for multifocal pneumonia, including COVID-19. Electronically Signed   By: 12/16/2019 M.D.   On: 12/05/2019 20:49      IMPRESSION AND PLAN:   1.  Multifocal community-acquired pneumonia with subsequent acute hypoxic respiratory failure. -The patient will be admitted to the medical monitored bed. -We will continue antibiotic therapy with IV Rocephin and Zithromax. -Mucolytic's will be provided as well as bronchodilator therapy. -We will follow blood and sputum culture. -We will check urine pneumoniae antigens. -The patient was negative for COVID-19.  2.  Essential hypertension. -We will continue Coreg, lisinopril and HCTZ.  3.  Dyslipidemia. -We will continue statin therapy and fish oil.  4.  Depression. -We will continue Celexa.  5.  BPH. -We will continue Proscar.  6.  DVT prophylaxis. -Subcutaneous Lovenox.   All the records are reviewed and case discussed with ED provider. The plan of care was discussed in details with the patient (and family). I answered all questions. The patient agreed to proceed with the above mentioned plan. Further management will depend upon hospital course.   CODE STATUS: Full code  Status is: Inpatient  Remains inpatient appropriate because:Ongoing diagnostic testing  needed not appropriate for outpatient work up, Unsafe d/c plan, IV treatments appropriate due to intensity of illness or inability to take PO and Inpatient level of care appropriate due to severity of illness   Dispo: The patient is from: Home              Anticipated d/c is to: Home              Anticipated d/c date is: 2 days              Patient currently is not medically stable to d/c.    TOTAL TIME TAKING  CARE OF THIS PATIENT: 55 minutes.    Hannah Beat M.D on 12/06/2019 at 3:44 AM  [facility]   From 7 PM-7 AM, contact night-coverage [email or web address]  CC: Primary care physician; Larwance Sachs, Cassie Freer, MD

## 2019-12-06 NOTE — ED Notes (Signed)
Pt pressures trending downward, no symptoms. Admit MD notified.

## 2019-12-06 NOTE — ED Notes (Signed)
Brief and sheets changed due to soiling. Pt has consumed approx 50% of meal tray.

## 2019-12-06 NOTE — ED Provider Notes (Signed)
[facility] Emergency Department Provider Note ____________________________________________   First MD Initiated Contact with Patient 12/06/19 0250     (approximate)  I have reviewed the triage vital signs and the nursing notes.   HISTORY  Chief Complaint Abnormal Lab    HPI Nicholas Alvarez is a 79 y.o. male with PMH as noted below as well as a diagnosis of COVID-19 last month who presents with worsening shortness of breath, exertional dyspnea, and generalized weakness over the last several weeks.  Patient also reports a nonproductive cough but no fever.  He has no vomiting or diarrhea, but does report constipation and decreased appetite.  He went to his doctor yesterday, and had a chest x-ray and lab work-up.  He was told to come to the ED due to pneumonia on the chest x-ray and an elevated white blood cell count.  Past Medical History:  Diagnosis Date  . 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 Dyslipidemia   . Ischemic cardiomyopathy    with left ventricular ejection fraction of 35%    Patient Active Problem List   Diagnosis Date Noted  . Acute hypoxemic respiratory failure due to COVID-19 (HCC) 12/06/2019  . HYPERLIPIDEMIA-MIXED 09/29/2008  . CAD, ARTERY BYPASS GRAFT 09/29/2008  . CARDIOMYOPATHY, ISCHEMIC 09/29/2008      Prior to Admission medications   Medication Sig Start Date End Date Taking? Authorizing Provider  aspirin 325 MG EC tablet Take 325 mg by mouth daily.      [provider]  carvedilol (COREG) 25 MG tablet Take 25 mg by mouth 2 (two) times daily with a meal.      [provider]  lisinopril (PRINIVIL,ZESTRIL) 20 MG tablet Take 20 mg by mouth daily.      [provider]  simvastatin (ZOCOR) 40 MG tablet Take 40 mg by mouth at bedtime.      [provider]    Allergies Patient has no known allergies.  Family History  Problem  Relation Age of Onset  . Coronary artery disease Other   . Heart attack Sister 52  . Heart attack Maternal Grandfather 40  . Heart attack Paternal Grandfather 40  . COPD Mother   . Pneumonia Father   . Cancer Sister     Social History Social History   Tobacco Use  . Smoking status: Former Games developer  . Smokeless tobacco: Never Used  Substance Use Topics  . Alcohol use: Yes  . Drug use: No    Review of Systems  Constitutional: No fever.  Positive for weakness. Eyes: No visual changes. ENT: No sore throat. Cardiovascular: Denies chest pain. Respiratory: Positive for shortness of breath. Gastrointestinal: No vomiting or diarrhea.  Genitourinary: Negative for dysuria.  Musculoskeletal: Negative for back pain. Skin: Negative for rash. Neurological: Negative for headache.   ____________________________________________   PHYSICAL EXAM:  VITAL SIGNS: ED Triage Vitals  Enc Vitals Group     BP 12/05/19 1956 107/67     Pulse Rate 12/05/19 1956 (!) 115     Resp 12/05/19 1956 20     Temp 12/05/19 1956 98.4 F (36.9 C)     Temp Source 12/05/19 1956 Oral     SpO2 12/05/19 1956 96 %     Weight 12/05/19 1958 140 lb (63.5 kg)     Height 12/05/19 1958 5\' 10"  (1.778 m)     Head Circumference --  Peak Flow --      Pain Score 12/05/19 1957 0     Pain Loc --      Pain Edu? --      Excl. in GC? --     Constitutional: Alert and oriented.  Frail appearing but in no acute distress. Eyes: Conjunctivae are normal.  Head: Atraumatic. Nose: No congestion/rhinnorhea. Mouth/Throat: Mucous membranes are slightly dry. Neck: Normal range of motion.  Cardiovascular: Normal rate, regular rhythm. Grossly normal heart sounds.  Good peripheral circulation. Respiratory: Normal respiratory effort.  No retractions. Lungs CTAB. Gastrointestinal:  No distention.  Musculoskeletal: No lower extremity edema.  Extremities warm and well perfused.  Neurologic:  Normal speech and language. No gross  focal neurologic deficits are appreciated.  Skin:  Skin is warm and dry. No rash noted. Psychiatric: Mood and affect are normal. Speech and behavior are normal.  ____________________________________________   LABS (all labs ordered are listed, but only abnormal results are displayed)  Labs Reviewed  BASIC METABOLIC PANEL - Abnormal; Notable for the following components:      Result Value   Glucose, Bld 103 (*)    All other components within normal limits  CBC - Abnormal; Notable for the following components:   WBC 18.6 (*)    RBC 3.90 (*)    Hemoglobin 10.1 (*)    HCT 32.7 (*)    MCH 25.9 (*)    RDW 17.0 (*)    Platelets 503 (*)    All other components within normal limits  URINALYSIS, COMPLETE (UACMP) WITH MICROSCOPIC - Abnormal; Notable for the following components:   Color, Urine YELLOW (*)    APPearance HAZY (*)    Ketones, ur 5 (*)    All other components within normal limits  RESPIRATORY PANEL BY RT PCR (FLU A&B, COVID)  PROCALCITONIN  LACTIC ACID, PLASMA  LACTIC ACID, PLASMA  TROPONIN I (HIGH SENSITIVITY)  TROPONIN I (HIGH SENSITIVITY)   ____________________________________________  EKG  ED ECG REPORT I, Dionne Bucy, the attending physician, personally viewed and interpreted this ECG.  Date: 12/06/2019 EKG Time: 2006 Rate: 109 Rhythm: Sinus tachycardia QRS Axis: normal Intervals: normal ST/T Wave abnormalities: Nonspecific abnormalities Narrative Interpretation: Nonspecific abnormalities with no evidence of acute ischemia  ____________________________________________  RADIOLOGY  CXR interpreted by me shows multifocal interstitial infiltrates consistent with COVID-19 or other pneumonia ____________________________________________   PROCEDURES  Procedure(s) performed: No  Procedures  Critical Care performed: No ____________________________________________   INITIAL IMPRESSION / ASSESSMENT AND PLAN / ED COURSE  Pertinent labs & imaging  results that were available during my care of the patient were reviewed by me and considered in my medical decision making (see chart for details).  79 year old male with PMH as noted above presents with worsening shortness of breath, generalized weakness and cough over the last several weeks.  He was seen by his PMD yesterday and sent into the hospital for pneumonia and increased WBC count.  I reviewed the past medical records in care everywhere.  The patient was seen at the [facility] ED on 8/9 and diagnosed with COVID-19.  He was referred for monoclonal antibody infusion.  He did not require hospitalization at that time.  He was seen by his PMD yesterday and noted the symptoms above as well as unintentional weight loss.  On exam today, the patient is somewhat weak and frail appearing.  He is borderline tachycardic.  O2 saturation was in the high 80s on room air, and is up in the 90s on 2 L  by nasal cannula.  He has no significant increased work of breathing or respiratory distress.  Exam is otherwise as described above.  The patient had to wait several hours before being seen.  During this time lab work-up was obtained which revealed elevated WBC count of 18 and chest x-ray showed multifocal pneumonia consistent with COVID-19.  COVID-19 swab is currently negative.  Differential includes persistent pneumonia related specifically to COVID-19, versus superimposed bacterial pneumonia.  I have ordered IV antibiotics for CAP and additional lab work-up including procalcitonin and lactate.  Given the oxygen requirement I anticipate admission.  ----------------------------------------- 3:50 AM on 12/06/2019 -----------------------------------------  I discussed the case with Dr. Arville Care from the hospitalist service for admission.  ____________________________________________   FINAL CLINICAL IMPRESSION(S) / ED DIAGNOSES  Final diagnoses:  Community acquired pneumonia, unspecified laterality      NEW  MEDICATIONS STARTED DURING THIS VISIT:  New Prescriptions   No medications on file     Note:  This document was prepared using Dragon voice recognition software and may include unintentional dictation errors.    Dionne Bucy, MD 12/06/19 [phone]

## 2019-12-07 DIAGNOSIS — U071 COVID-19: Secondary | ICD-10-CM

## 2019-12-07 DIAGNOSIS — J189 Pneumonia, unspecified organism: Principal | ICD-10-CM

## 2019-12-07 DIAGNOSIS — J9601 Acute respiratory failure with hypoxia: Secondary | ICD-10-CM

## 2019-12-07 LAB — CBC WITH DIFFERENTIAL/PLATELET
Abs Immature Granulocytes: 0.09 10*3/uL — ABNORMAL HIGH (ref 0.00–0.07)
Basophils Absolute: 0 10*3/uL (ref 0.0–0.1)
Basophils Relative: 0 %
Eosinophils Absolute: 0.2 10*3/uL (ref 0.0–0.5)
Eosinophils Relative: 2 %
HCT: 24.7 % — ABNORMAL LOW (ref 39.0–52.0)
Hemoglobin: 7.8 g/dL — ABNORMAL LOW (ref 13.0–17.0)
Immature Granulocytes: 1 %
Lymphocytes Relative: 14 %
Lymphs Abs: 1.4 10*3/uL (ref 0.7–4.0)
MCH: 26.4 pg (ref 26.0–34.0)
MCHC: 31.6 g/dL (ref 30.0–36.0)
MCV: 83.4 fL (ref 80.0–100.0)
Monocytes Absolute: 0.9 10*3/uL (ref 0.1–1.0)
Monocytes Relative: 9 %
Neutro Abs: 7.6 10*3/uL (ref 1.7–7.7)
Neutrophils Relative %: 74 %
Platelets: 362 10*3/uL (ref 150–400)
RBC: 2.96 MIL/uL — ABNORMAL LOW (ref 4.22–5.81)
RDW: 16.7 % — ABNORMAL HIGH (ref 11.5–15.5)
WBC: 10.3 10*3/uL (ref 4.0–10.5)
nRBC: 0 % (ref 0.0–0.2)

## 2019-12-07 LAB — COMPREHENSIVE METABOLIC PANEL
ALT: 15 U/L (ref 0–44)
AST: 17 U/L (ref 15–41)
Albumin: 1.8 g/dL — ABNORMAL LOW (ref 3.5–5.0)
Alkaline Phosphatase: 56 U/L (ref 38–126)
Anion gap: 9 (ref 5–15)
BUN: 12 mg/dL (ref 8–23)
CO2: 24 mmol/L (ref 22–32)
Calcium: 8.3 mg/dL — ABNORMAL LOW (ref 8.9–10.3)
Chloride: 105 mmol/L (ref 98–111)
Creatinine, Ser: 0.59 mg/dL — ABNORMAL LOW (ref 0.61–1.24)
GFR calc Af Amer: >60 mL/min (ref >60)
GFR calc non Af Amer: >60 mL/min (ref >60)
Glucose, Bld: 93 mg/dL (ref 70–99)
Potassium: 3.1 mmol/L — ABNORMAL LOW (ref 3.5–5.1)
Sodium: 138 mmol/L (ref 135–145)
Total Bilirubin: 0.5 mg/dL (ref 0.3–1.2)
Total Protein: 6.3 g/dL — ABNORMAL LOW (ref 6.5–8.1)

## 2019-12-07 LAB — IRON AND TIBC
Iron: 11 ug/dL — ABNORMAL LOW (ref 45–182)
Saturation Ratios: 10 % — ABNORMAL LOW (ref 17.9–39.5)
TIBC: 115 ug/dL — ABNORMAL LOW (ref 250–450)
UIBC: 104 ug/dL

## 2019-12-07 LAB — HIV ANTIBODY (ROUTINE TESTING W REFLEX): HIV Screen 4th Generation wRfx: NONREACTIVE

## 2019-12-07 LAB — OCCULT BLOOD X 1 CARD TO LAB, STOOL: Fecal Occult Bld: NEGATIVE

## 2019-12-07 LAB — LACTIC ACID, PLASMA
Lactic Acid, Venous: 1.3 mmol/L (ref 0.5–1.9)
Lactic Acid, Venous: 3.4 mmol/L (ref 0.5–1.9)

## 2019-12-07 MED ORDER — HYDROCOD POLST-CPM POLST ER 10-8 MG/5ML PO SUER
5.0000 mL | Freq: Two times a day (BID) | ORAL | Status: DC
  Administered 2019-12-07 – 2019-12-11 (×9): 5 mL via ORAL
  Filled 2019-12-07 (×9): qty 5

## 2019-12-07 MED ORDER — RISAQUAD PO CAPS
1.0000 | ORAL_CAPSULE | Freq: Three times a day (TID) | ORAL | Status: DC
  Administered 2019-12-07 – 2019-12-11 (×13): 1 via ORAL
  Filled 2019-12-07 (×14): qty 1

## 2019-12-07 MED ORDER — POTASSIUM CHLORIDE CRYS ER 20 MEQ PO TBCR
40.0000 meq | EXTENDED_RELEASE_TABLET | Freq: Once | ORAL | Status: AC
  Administered 2019-12-07: 40 meq via ORAL
  Filled 2019-12-07: qty 2

## 2019-12-07 MED ORDER — ADULT MULTIVITAMIN W/MINERALS CH
1.0000 | ORAL_TABLET | Freq: Every day | ORAL | Status: DC
  Administered 2019-12-07 – 2019-12-11 (×5): 1 via ORAL
  Filled 2019-12-07 (×5): qty 1

## 2019-12-07 MED ORDER — ENSURE ENLIVE PO LIQD
237.0000 mL | Freq: Two times a day (BID) | ORAL | Status: DC
  Administered 2019-12-08 – 2019-12-11 (×7): 237 mL via ORAL

## 2019-12-07 MED ORDER — MAGNESIUM CITRATE PO SOLN
1.0000 | Freq: Once | ORAL | Status: AC
  Administered 2019-12-07: 1 via ORAL
  Filled 2019-12-07: qty 296

## 2019-12-07 NOTE — Evaluation (Signed)
Occupational Therapy Evaluation Patient Details Name: Nicholas Alvarez MRN: [id number] DOB: 1940-06-18 Today's Date: 12/07/2019    History of Present Illness Pt is a 79 y.o. male presenting to the hospital 9/28 with acute onset worsening dyspnea with associated dry cough, wheezing, generalized weakness, fatigue, and body aches.  Per chart (+) COVID test last month (8/9 at [facility] ED).  Pt was sent to ED d/t PNA and increased WBC count.  Pt admitted to hospital with multifocal community acquired PNA with subsequent acute hypoxic respiratory failure.  PMH includes CAD, dyslipidemia, ischemia cardiomyopathy, and htn.   Clinical Impression   Patient presenting with decreased I in self care, balance, functional mobility/transfers, endurance, and safety awareness. Patient reports being independent with self care and mobility PTA. Pt lives with wife.  Patient currently functioning at min guard - min A for self care tasks with standing balance. Pt ambulating 40' on RA with min guard and without use of AD. Pt with 2 LOB requiring min A with head turns while ambulating.  Patient will benefit from acute OT to increase overall independence in the areas of ADLs, functional mobility, and safety awareness in order to safely discharge home with caregiver.    Follow Up Recommendations  Home health OT;Supervision - Intermittent    Equipment Recommendations  None recommended by OT    Recommendations for Other Services Other (comment)     Precautions / Restrictions Precautions Precautions: Fall      Mobility Bed Mobility Overal bed mobility: Modified Independent    General bed mobility comments: Semi-supine to/from sitting with mild increased effort to perform on own  Transfers Overall transfer level: Needs assistance Equipment used: Rolling walker (2 wheeled) Transfers: Sit to/from Stand Sit to Stand: Min assist         General transfer comment: assist to initiate and come to full stand up to RW;  assist to control descent sitting back onto bed    Balance Overall balance assessment: Needs assistance Sitting-balance support: No upper extremity supported;Feet supported Sitting balance-Leahy Scale: Good Sitting balance - Comments: steady sitting reaching within BOS   Standing balance support: Single extremity supported Standing balance-Leahy Scale: Poor Standing balance comment: pt requiring at least single UE support for static standing balance            ADL either performed or assessed with clinical judgement   ADL Overall ADL's : Needs assistance/impaired     Grooming: Wash/dry hands;Wash/dry face;Oral care;Sitting;Min guard   Upper Body Bathing: Set up;Sitting   Lower Body Bathing: Min guard;Sitting/lateral leans       Lower Body Dressing: Min guard Lower Body Dressing Details (indicate cue type and reason): use of figure four position to don socks from [employer] Transfer: Min guard;Ambulation   Toileting- Clothing Manipulation and Hygiene: Min guard;Sit to/from stand         General ADL Comments: min guard- min A for functional mobility without use of AD. Pt able to perform self care tasks with set up A UB and need of min guard for standing balance with LB clothing management.     Vision Patient Visual Report: No change from baseline              Pertinent Vitals/Pain Pain Assessment: No/denies pain     Hand Dominance     Extremity/Trunk Assessment Upper Extremity Assessment Upper Extremity Assessment: Generalized weakness   Lower Extremity Assessment Lower Extremity Assessment: Generalized weakness   Cervical / Trunk Assessment Cervical / Trunk Assessment: Normal  Communication Communication Communication: No difficulties   Cognition Arousal/Alertness: Awake/alert Behavior During Therapy: WFL for tasks assessed/performed Overall Cognitive Status: Within Functional Limits for tasks assessed                   Home Living  Family/patient expects to be discharged to:: Private residence Living Arrangements: Spouse/significant other Available Help at Discharge: Family   Home Access: Stairs to enter Secretary/administrator of Steps: 3 Entrance Stairs-Rails: Right;Left;Can reach both Home Layout: One level     Bathroom Shower/Tub: Chief Strategy Officer: Standard     Home Equipment: None          Prior Functioning/Environment Level of Independence: Independent        Comments: Holds onto furniture as needed when walking; no recent falls.        OT Problem List: Decreased coordination;Decreased strength;Decreased safety awareness;Cardiopulmonary status limiting activity;Decreased activity tolerance;Impaired balance (sitting and/or standing)      OT Treatment/Interventions: Self-care/ADL training;Therapeutic exercise;Therapeutic activities;Patient/family education;Balance training;DME and/or AE instruction    OT Goals(Current goals can be found in the care plan section) Acute Rehab OT Goals Patient Stated Goal: to improve breathing and go home OT Goal Formulation: With patient Time For Goal Achievement: 12/21/19 Potential to Achieve Goals: Good ADL Goals Pt Will Perform Grooming: with modified independence;standing Pt Will Transfer to Toilet: with modified independence;ambulating Pt Will Perform Toileting - Clothing Manipulation and hygiene: with modified independence;sit to/from stand  OT Frequency: Min 1X/week   Barriers to D/C: Other (comment)  none at this time          AM-PAC OT "6 Clicks" Daily Activity     Outcome Measure Help from another person eating meals?: None Help from another person taking care of personal grooming?: A Little Help from another person toileting, which includes using toliet, bedpan, or urinal?: A Little Help from another person bathing (including washing, rinsing, drying)?: A Little Help from another person to put on and taking off regular upper  body clothing?: A Little Help from another person to put on and taking off regular lower body clothing?: A Little 6 Click Score: 19   End of Session Nurse Communication: Mobility status;Other (comment) (O2 saturation on RA)  Activity Tolerance: Patient tolerated treatment well Patient left: in bed;with call bell/phone within reach;with bed alarm set  OT Visit Diagnosis: Unsteadiness on feet (R26.81);Muscle weakness (generalized) (M62.81)                Time: 1345-1405 OT Time Calculation (min): 20 min Charges:  OT General Charges $OT Visit: 1 Visit OT Evaluation $OT Eval Low Complexity: 1 Low OT Treatments $Self Care/Home Management : 8-22 mins  Jackquline Denmark, MS, OTR/L , CBIS ascom [phone]  12/07/19, 3:56 PM

## 2019-12-07 NOTE — Progress Notes (Signed)
Initial Nutrition Assessment  DOCUMENTATION CODES:   Severe malnutrition in context of chronic illness  INTERVENTION:  Ensure Enlive po BID, each supplement provides 350 kcal and 20 grams of protein (vanilla)  MVI with minerals daily  Liberalize diet to regular to encourage po intake  Education provided  NUTRITION DIAGNOSIS:   Severe Malnutrition related to chronic illness as evidenced by energy intake < or equal to 75% for > or equal to 1 month, severe fat depletion, moderate muscle depletion, severe muscle depletion, percent weight loss.    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  Patient resting in bed, reports feeling tired this afternoon. He recalls eating about half of his lunch today, recalls mac and cheese, talipia and a bite of broccoli. Patient reports ongoing altered taste over the past few months, eats mostly fruits and vegetables at home and can taste sweet things. Patient recalls drinking coffee every morning, however it taste very bitter to him as do starchy foods and does not want either of them anymore.  Patient endorsed significant wt loss r/t recent Covid infection and prior bronchitis. Pt recalls 240 lbs at one time, but ~220 lb a few months ago. Per chart, weights have trended down ~19 lbs (10.8%) in the last 3 weeks,  ~47 lbs (22.9%) over the past 3 months, and ~64 lbs (28.7%) over the past year; significant.  RD discussed strategies to enhance flavors of food (adding lemon juice, vinegar,  using extra herbs/spices, adding hot sauce) recommended small frequent meals/snacks and recommended nutrition supplement to aid with meeting needs. Patient appreciative of suggestions and agreeable to trying vanilla Ensure. Pt on [place] diet which restricts protein and limits menu options. Given poor po intake of meals, will liberalize diet to regular to encourage po intake.  Medications reviewed and include: Acidophilus, Vit C, Zithromax, D3, Mucinex, Mg Citrate, Mag-ox, Megace, Lovaza, IV Rocephin  IVF: NaCl @ 100 ml/hr  Labs: K 3.1, Hgb 7.8 (L), HCT 24.7 (L)   NUTRITION - FOCUSED PHYSICAL EXAM:    Most Recent Value  Orbital Region Severe depletion  Upper Arm Region Moderate depletion  Thoracic and Lumbar Region Severe depletion  Buccal Region Severe depletion  Temple Region Severe depletion  Clavicle Bone Region Severe depletion  Clavicle and Acromion Bone Region Severe depletion  Scapular Bone Region Unable to assess  Dorsal Hand Severe depletion  Patellar Region Severe depletion  Anterior Thigh Region Unable to assess  [pt wearing flannel pants from home]  Posterior Calf Region Severe depletion  Edema (RD Assessment) None  Hair Reviewed  Eyes Reviewed  Mouth Reviewed  Skin Reviewed  Nails Reviewed       Diet Order:   Diet Order            Diet Heart Room service appropriate? Yes; Fluid consistency: Thin  Diet effective now                 EDUCATION NEEDS:   Education needs have been addressed  Skin:  Skin Assessment: Reviewed RN  Assessment  Last BM:  pta  Height:   Ht Readings from Last 1 Encounters:  12/05/19 _0  (1.778 m)    Weight:   Wt Readings from Last 1 Encounters:  12/07/19 72.4 kg     BMI:  Body mass index is 22.9 kg/m.  Estimated Nutritional Needs:   Kcal:  2000-2200  Protein:  95-105  Fluid:  >1.8 L/day   Lajuan Lines, RD, LDN Clinical Nutrition After Hours/Weekend Pager # in [place]

## 2019-12-07 NOTE — TOC Initial Note (Signed)
Transition of Care [facility]) - Initial/Assessment Note    Patient Details  Name: Nicholas Alvarez MRN: [id number] Date of Birth: [birth year]  Transition of Care [facility]) CM/SW Contact:    Candie Chroman, LCSW Phone Number:  12/07/2019, 1:00 PM  Clinical Narrative: CSW met with patient. Wife at bedside. CSW introduced role and explained that PT recommendations would be discussed. Patient and his wife are agreeable to home health. [facility] is first preference. They are able to accept him for PT, OT, RN. Start of care will be Monday if he discharges before then. Discussed DME recommendations for a rolling walker and bedside commode. Wife has these at home from when she used them previously and stated he can use those. No further concerns. CSW will continue to follow patient for support and facilitate return home when stable.                 Expected Discharge Plan: [place] Barriers to Discharge: Continued Medical Work up   Patient Goals and CMS Choice        Expected Discharge Plan and Services Expected Discharge Plan: [place] Choice: [place] arrangements for the past 2 months: Single Family Home                                      Prior Living Arrangements/Services Living arrangements for the past 2 months: Single Family Home Lives with:: Spouse Patient language and need for interpreter reviewed:: Yes Do you feel safe going back to the place where you live?: Yes      Need for Family Participation in Patient Care: Yes (Comment) Care giver support system in place?: Yes (comment)   Criminal Activity/Legal Involvement Pertinent to Current Situation/Hospitalization: No - Comment as needed  Activities of Daily Living Home Assistive Devices/Equipment: Eyeglasses, Hearing aid ADL Screening (condition at time of admission) Patient's cognitive ability adequate to safely complete daily activities?: Yes Is  the patient deaf or have difficulty hearing?: No Does the patient have difficulty seeing, even when wearing glasses/contacts?: No Does the patient have difficulty concentrating, remembering, or making decisions?: No Patient able to express need for assistance with ADLs?: Yes Does the patient have difficulty dressing or bathing?: No Independently performs ADLs?: Yes (appropriate for developmental age) Does the patient have difficulty walking or climbing stairs?: No Weakness of Legs: Both Weakness of Arms/Hands: None  Permission Sought/Granted Permission sought to share information with : Facility Sport and exercise psychologist, Family Supports Permission granted to share information with : Yes, Verbal Permission Granted  Share Information with NAME: Jaxxen Voong  Permission granted to share info w AGENCY: [place] granted to share info w Relationship: Wife  Permission granted to share info w Contact Information: [phone]  Emotional Assessment Appearance:: Appears stated age Attitude/Demeanor/Rapport: Engaged, Gracious Affect (typically observed): Accepting, Appropriate, Calm, Pleasant Orientation: : Oriented to Self, Oriented to Place, Oriented to  Time, Oriented to Situation Alcohol / Substance Use: Not Applicable Psych Involvement: No (comment)  Admission diagnosis:  Acute hypoxemic respiratory failure ([place]) [J96.01] Community acquired pneumonia, unspecified laterality [J18.9] Acute hypoxemic respiratory failure due to COVID-19 ([place]) [U07.1, J96.01] Patient Active Problem List   Diagnosis Date Noted   Acute hypoxemic respiratory failure due to COVID-19 ([place]) 12/06/2019   Acute hypoxemic respiratory failure ([place]) 12/06/2019   HYPERLIPIDEMIA-MIXED 09/29/2008  CAD, ARTERY BYPASS GRAFT 09/29/2008   CARDIOMYOPATHY, ISCHEMIC 09/29/2008   PCP:  No primary care provider on file. Pharmacy:   [facility] [address] ([address]), [place] [address] [place]  [address] [place]) [place] [address] Phone: [phone] Fax: [phone]     Social Determinants of Health (SDOH) Interventions    Readmission Risk Interventions No flowsheet data found.

## 2019-12-07 NOTE — Progress Notes (Signed)
PROGRESS NOTE    Patient: Nicholas Alvarez                            PCP: No primary care provider on file.                    DOB: Dec 26, 1940            DOA: 12/06/2019 [id number]             DOS: 12/07/2019, 8:17 AM   LOS: 1 day   Date of Service: The patient was seen and examined on 12/07/2019  Subjective:   The patient was seen and examined this Am. Stable  Otherwise no issues overnight . Still complaining shortness of breath, on supplemental oxygen 2 L satting 98%  Brief Narrative:   Nicholas Alvarez  is a 79 y.o.  male with a known history of coronary artery disease, dyslipidemia, ischemic cardiomyopathy and hypertension, who presented to the emergency room with acute onset of worsening dyspnea with associated dry cough and wheezing with generalized weakness and fatigue as well as body aches.  The patient had a positive Covid test last month and has been treated.   He was seen by his primary care physician and had a CBC that showed leukocytosis and a chest x-ray that showed pneumonia and therefore was referred to the emergency room.   The patient denied any nausea or vomiting or diarrhea.  He has been constipated.  He denied any fever or chills.  ED: HR was 115 with otherwise normal vital signs.  Pulse ox symmetry however was down to the 80s on room air and came up to the high 90s on 2 L of O2 by nasal cannula.   -Labs revealed unremarkable CMP.  High-sensitivity troponin I was 13 and later 11.  Lactic acid was 1.9 later 2.1 with procalcitonin 0.14.  CBC showed leukocytosis of 18.6 influenza antigens came back negative. COVID-19 PCR came back negative.   Chest x-ray showed Patchy heterogeneous bilateral lung opacities have progressed from prior exam, most prominently involving the periphery of the left upper lobe. Findings are suspicious for multifocal pneumonia, including COVID-19.  The patient was given IV Rocephin and Zithromax as well as Reglan.  He will be admitted to a  medical monitored bed for further evaluation and management   Assessment & Plan:   Active Problems:   Acute hypoxemic respiratory failure due to COVID-19 [facility])   Acute hypoxemic respiratory failure (HCC)    1.  Multifocal community-acquired pneumonia with subsequent acute hypoxic respiratory failure. - stable  -Satting 98% on 2 L of oxygen -Improved leukocytosis 18.6, 13.9, 10.3 today -Lactic acid 2.1 -Otherwise stable  -we will continue IV Rocephin and Zithromax. -Mucolytic's will be provided as well as bronchodilator therapy. -We will follow blood and sputum culture. -We will check urine pneumoniae antigens. -The patient was negative for COVID-19.  2.  Essential hypertension. - stable  -We will continue Coreg, lisinopril and HCTZ.  3.  Dyslipidemia. -We will continue statin therapy and fish oil. Stable   4.  Depression. -We will continue Celexa. Stable   5.  BPH. -We will continue Proscar   6.  Anemia Hemoglobin dropped from 10.4- >> 7.8 -Obtaining Hemoccult, iron studies  7. Hypokalemia  -Potassium 3.1,  -repleting p.o. potassium supplements   Nutritional status:            Cultures; Blood Cultures x 2 >> NGT  Sputum culture >>   Antimicrobials: V Rocephin and Zithromax.     Consultants: None   ------------------------------------------------------------------------------------------------------------------------------------------------  DVT prophylaxis:  SCD/Compression stockings and Lovenox SQ Code Status:   Code Status: Full Code Family Communication: No family member present at bedside- attempt will be made to update daily The above findings and plan of care has been discussed with patient (and family )  in detail,  they expressed understanding and agreement of above. -Advance care planning has been discussed.   Admission status:    Status is: Inpatient  Remains inpatient appropriate because:Inpatient level of care  appropriate due to severity of illness   Dispo: The patient is from: Home              Anticipated d/c is to: Home              Anticipated d/c date is: 2 days              Patient currently is not medically stable to d/c.        Procedures:   No admission procedures for hospital encounter.     Antimicrobials:  Anti-infectives (From admission, onward)   Start     Dose/Rate Route Frequency Ordered Stop   12/07/19 1000  remdesivir 100 mg in sodium chloride 0.9 % 100 mL IVPB  Status:  Discontinued       "Followed by" Linked Group Details   100 mg 200 mL/hr over 30 Minutes Intravenous Daily 12/06/19 0725 12/06/19 0732   12/07/19 1000  cefTRIAXone (ROCEPHIN) 2 g in sodium chloride 0.9 % 100 mL IVPB        2 g 200 mL/hr over 30 Minutes Intravenous Every 24 hours 12/06/19 0740 12/11/19 0959   12/07/19 1000  azithromycin (ZITHROMAX) 500 mg in sodium chloride 0.9 % 250 mL IVPB  Status:  Discontinued        500 mg 250 mL/hr over 60 Minutes Intravenous Every 24 hours 12/06/19 0740 12/06/19 1229   12/07/19 1000  azithromycin (ZITHROMAX) tablet 500 mg        500 mg Oral Daily 12/06/19 1229 12/11/19 0959   12/06/19 0730  remdesivir 200 mg in sodium chloride 0.9% 250 mL IVPB  Status:  Discontinued       "Followed by" Linked Group Details   200 mg 580 mL/hr over 30 Minutes Intravenous Once 12/06/19 0725 12/06/19 0732   12/06/19 0315  azithromycin (ZITHROMAX) 500 mg in sodium chloride 0.9 % 250 mL IVPB        500 mg 250 mL/hr over 60 Minutes Intravenous  Once 12/06/19 0301 12/06/19 0453   12/06/19 0315  cefTRIAXone (ROCEPHIN) 2 g in sodium chloride 0.9 % 100 mL IVPB        2 g 200 mL/hr over 30 Minutes Intravenous  Once 12/06/19 0301 12/06/19 0356       Medication:  . vitamin C  500 mg Oral Daily  . aspirin  325 mg Oral Daily  . azithromycin  500 mg Oral Daily  . buPROPion  150 mg Oral q morning - 10a  . cholecalciferol  1,000 Units Oral Daily  . clopidogrel  75 mg Oral Daily   . DULoxetine  60 mg Oral Daily  . enoxaparin (LOVENOX) injection  40 mg Subcutaneous Q24H  . famotidine  20 mg Oral BID  . finasteride  5 mg Oral Daily  . guaiFENesin  600 mg Oral BID  . ipratropium-albuterol  3 mL Nebulization QID  . magnesium oxide  200 mg Oral Daily  . megestrol  40 mg Oral Daily  . midodrine  5 mg Oral TID WC  . montelukast  10 mg Oral Daily  . omega-3 acid ethyl esters  1,000 mg Oral Daily  . simvastatin  40 mg Oral q1800  . tamsulosin  0.4 mg Oral Daily    acetaminophen, chlorpheniramine-HYDROcodone, fluticasone, guaiFENesin-dextromethorphan, influenza vaccine adjuvanted, magnesium hydroxide, ondansetron **OR** ondansetron (ZOFRAN) IV, traZODone   Objective:   Vitals:   12/06/19 2030 12/06/19 2227 12/07/19 0420 12/07/19 0446  BP:  (!) 94/59 101/67   Pulse: 98 79 81   Resp: 18 16 17    Temp:  (!) 97.5 F (36.4 C) 97.7 F (36.5 C)   TempSrc:  Oral    SpO2: 97% 97% 98%   Weight:    72.4 kg  Height:        Intake/Output Summary (Last 24 hours) at 12/07/2019 0817 Last data filed at 12/07/2019 0446 Gross per 24 hour  Intake 1854.13 ml  Output 800 ml  Net 1054.13 ml   Filed Weights   12/05/19 1958 12/07/19 0446  Weight: 63.5 kg 72.4 kg     Examination:   Physical Exam  Constitution:  Alert, cooperative, no distress,  Appears calm and comfortable  Psychiatric: Normal and stable mood and affect, cognition intact,   HEENT: Normocephalic, PERRL, otherwise with in Normal limits  Chest:Chest symmetric Cardio vascular:  S1/S2, RRR, No murmure, No Rubs or Gallops  pulmonary: Clear to auscultation bilaterally, respirations unlabored, negative wheezes / crackles Abdomen: Soft, non-tender, non-distended, bowel sounds,no masses, no organomegaly Muscular skeletal: Limited exam - in bed, able to move all 4 extremities, Normal strength,  Neuro: CNII-XII intact. , normal motor and sensation, reflexes intact  Extremities: No pitting edema lower extremities, +2  pulses  Skin: Dry, warm to touch, negative for any Rashes, No open wounds Wounds: per nursing documentation    ------------------------------------------------------------------------------------------------------------------------------------------    LABs:  CBC Latest Ref Rng & Units 12/07/2019 12/06/2019 12/05/2019  WBC 4.0 - 10.5 K/uL 10.3 13.9(H) 18.6(H)  Hemoglobin 13.0 - 17.0 g/dL 7.8(L) 10.4(L) 10.1(L)  Hematocrit 39 - 52 % 24.7(L) 34.2(L) 32.7(L)  Platelets 150 - 400 K/uL 362 452(H) 503(H)   CMP Latest Ref Rng & Units 12/07/2019 12/05/2019 10/24/2012  Glucose 70 - 99 mg/dL 93 161(W103(H) 960(A149(H)  BUN 8 - 23 mg/dL 12 13 16   Creatinine 0.61 - 1.24 mg/dL 5.40(J0.59(L) 8.110.74 9.140.91  Sodium 135 - 145 mmol/L 138 135 138  Potassium 3.5 - 5.1 mmol/L 3.1(L) 4.1 3.7  Chloride 98 - 111 mmol/L 105 100 107  CO2 22 - 32 mmol/L 24 23 24   Calcium 8.9 - 10.3 mg/dL 8.3(L) 9.6 9.3  Total Protein 6.5 - 8.1 g/dL 6.3(L) - 7.0  Total Bilirubin 0.3 - 1.2 mg/dL 0.5 - 0.2  Alkaline Phos 38 - 126 U/L 56 - 75  AST 15 - 41 U/L 17 - 27  ALT 0 - 44 U/L 15 - 27       Micro Results Recent Results (from the past 240 hour(s))  Respiratory Panel by RT PCR (Flu A&B, Covid) - Nasopharyngeal Swab     Status: None   Collection Time: 12/05/19 11:35 PM   Specimen: Nasopharyngeal Swab  Result Value Ref Range Status   SARS Coronavirus 2 by RT PCR NEGATIVE NEGATIVE Final    Comment: (NOTE) SARS-CoV-2 target nucleic acids are NOT DETECTED.  The SARS-CoV-2 RNA is generally detectable in upper respiratoy specimens during the acute phase of  infection. The lowest concentration of SARS-CoV-2 viral copies this assay can detect is 131 copies/mL. A negative result does not preclude SARS-Cov-2 infection and should not be used as the sole basis for treatment or other patient management decisions. A negative result may occur with  improper specimen collection/handling, submission of specimen other than nasopharyngeal swab, presence  of viral mutation(s) within the areas targeted by this assay, and inadequate number of viral copies (<131 copies/mL). A negative result must be combined with clinical observations, patient history, and epidemiological information. The expected result is Negative.  Fact Sheet for Patients:  https://www.moore.com/  Fact Sheet for Healthcare Providers:  https://www.young.biz/  This test is no t yet approved or cleared by the [place] FDA and  has been authorized for detection and/or diagnosis of SARS-CoV-2 by FDA under an Emergency Use Authorization (EUA). This EUA will remain  in effect (meaning this test can be used) for the duration of the COVID-19 declaration under Section 564(b)(1) of the Act, 21 U.S.C. section 360bbb-3(b)(1), unless the authorization is terminated or revoked sooner.     Influenza A by PCR NEGATIVE NEGATIVE Final   Influenza B by PCR NEGATIVE NEGATIVE Final    Comment: (NOTE) The Xpert Xpress SARS-CoV-2/FLU/RSV assay is intended as an aid in  the diagnosis of influenza from Nasopharyngeal swab specimens and  should not be used as a sole basis for treatment. Nasal washings and  aspirates are unacceptable for Xpert Xpress SARS-CoV-2/FLU/RSV  testing.  Fact Sheet for Patients: https://www.moore.com/  Fact Sheet for Healthcare Providers: https://www.young.biz/  This test is not yet approved or cleared by the [place] FDA and  has been authorized for detection and/or diagnosis of SARS-CoV-2 by  FDA under an Emergency Use Authorization (EUA). This EUA will remain  in effect (meaning this test can be used) for the duration of the  Covid-19 declaration under Section 564(b)(1) of the Act, 21  U.S.C. section 360bbb-3(b)(1), unless the authorization is  terminated or revoked. Performed at [facility], [address] [place], [place] [address]     Radiology  Reports DG Chest 2 View  Result Date: 12/05/2019 CLINICAL DATA:  Shortness of breath. EXAM: CHEST - 2 VIEW COMPARISON:  Radiograph 10/16/2019 FINDINGS: Patchy heterogeneous bilateral lung opacities have progressed from prior exam, most prominently involving the periphery of the left upper lobe. Post median sternotomy and CABG with normal heart size. No pneumothorax or pleural effusion. No acute osseous abnormalities are seen. IMPRESSION: Patchy heterogeneous bilateral lung opacities have progressed from prior exam, most prominently involving the periphery of the left upper lobe. Findings are suspicious for multifocal pneumonia, including COVID-19. Electronically Signed   By: Narda Rutherford M.D.   On: 12/05/2019 20:49    SIGNED: Kendell Bane, MD, FACP, FHM. [facility],  Pager (please use [email or web address] to page/text)  If 7PM-7AM, please contact night-coverage [email or web address] [facility] 12/07/2019, 8:17 AM

## 2019-12-07 NOTE — Evaluation (Signed)
Physical Therapy Evaluation Patient Details Name: Nicholas Alvarez MRN: [id number] DOB: November 05, 1940 Today's Date: 12/07/2019   History of Present Illness  Pt is a 79 y.o. male presenting to the hospital 9/28 with acute onset worsening dyspnea with associated dry cough, wheezing, generalized weakness, fatigue, and body aches.  Per chart (+) COVID test last month (8/9 at [facility] ED).  Pt was sent to ED d/t PNA and increased WBC count.  Pt admitted to hospital with multifocal community acquired PNA with subsequent acute hypoxic respiratory failure.  PMH includes CAD, dyslipidemia, ischemia cardiomyopathy, and htn.  Clinical Impression  Prior to hospital admission, pt was recently ambulatory shorter household distances (d/t SOB); held onto furniture as needed for balance; lives with his wife in 1 level home with 3 STE with B railings.  Currently pt is modified independent with bed mobility; min assist with transfers; and CGA with walking 12 feet with RW (limited distance ambulating d/t increasing SOB with exertion).  O2 sats 95% or greater on 2 L O2 via nasal cannula during sessions activities.  HR 78 bpm at rest and increased up to 101 bpm with activities.  Pt would benefit from skilled PT to address noted impairments and functional limitations (see below for any additional details).  Upon hospital discharge, pt would benefit from HHPT and 24/7 assist at home.    Follow Up Recommendations Home health PT;Supervision/Assistance - 24 hour    Equipment Recommendations  Rolling walker with 5" wheels;3in1 (PT)    Recommendations for Other Services OT consult     Precautions / Restrictions Precautions Precautions: Fall Restrictions Weight Bearing Restrictions: No      Mobility  Bed Mobility Overal bed mobility: Modified Independent             General bed mobility comments: Semi-supine to/from sitting with mild increased effort to perform on own  Transfers Overall transfer level: Needs  assistance Equipment used: Rolling walker (2 wheeled) Transfers: Sit to/from Stand Sit to Stand: Min assist         General transfer comment: assist to initiate and come to full stand up to RW; assist to control descent sitting back onto bed  Ambulation/Gait Ambulation/Gait assistance: Min guard;+2 safety/equipment Gait Distance (Feet): 12 Feet Assistive device: Rolling walker (2 wheeled)   Gait velocity: decreased   General Gait Details: decreased B LE step length; steady with RW but limited distance d/t SOB  Stairs            Wheelchair Mobility    Modified Rankin (Stroke Patients Only)       Balance Overall balance assessment: Needs assistance Sitting-balance support: No upper extremity supported;Feet supported Sitting balance-Leahy Scale: Good Sitting balance - Comments: steady sitting reaching within BOS   Standing balance support: Single extremity supported Standing balance-Leahy Scale: Poor Standing balance comment: pt requiring at least single UE support for static standing balance                             Pertinent Vitals/Pain Pain Assessment: No/denies pain     Home Living Family/patient expects to be discharged to:: Private residence Living Arrangements: Spouse/significant other Available Help at Discharge: Family Type of Home:  Counselling psychologist) Home Access: Stairs to enter Entrance Stairs-Rails: Right;Left;Can reach both Entrance Stairs-Number of Steps: 3 Home Layout: One level Home Equipment: None      Prior Function Level of Independence: Independent         Comments: Holds onto  furniture as needed when walking; no recent falls.     Hand Dominance        Extremity/Trunk Assessment   Upper Extremity Assessment Upper Extremity Assessment: Generalized weakness    Lower Extremity Assessment Lower Extremity Assessment: Generalized weakness    Cervical / Trunk Assessment Cervical / Trunk Assessment: Normal   Communication   Communication: No difficulties  Cognition Arousal/Alertness: Awake/alert Behavior During Therapy:  (more of a flat affect but does intermittently make jokes) Overall Cognitive Status: Within Functional Limits for tasks assessed                                        General Comments   Nursing cleared pt for participation in physical therapy.  Pt agreeable to PT session.    Exercises  Transfers and ambulation with RW   Assessment/Plan    PT Assessment Patient needs continued PT services  PT Problem List Decreased strength;Decreased activity tolerance;Decreased balance;Decreased mobility;Decreased knowledge of use of DME;Decreased knowledge of precautions;Cardiopulmonary status limiting activity       PT Treatment Interventions DME instruction;Gait training;Stair training;Functional mobility training;Therapeutic activities;Therapeutic exercise;Balance training;Patient/family education    PT Goals (Current goals can be found in the Care Plan section)  Acute Rehab PT Goals Patient Stated Goal: to improve breathing PT Goal Formulation: With patient Time For Goal Achievement: 12/21/19 Potential to Achieve Goals: Fair    Frequency Min 2X/week   Barriers to discharge        Co-evaluation               AM-PAC PT "6 Clicks" Mobility  Outcome Measure Help needed turning from your back to your side while in a flat bed without using bedrails?: None Help needed moving from lying on your back to sitting on the side of a flat bed without using bedrails?: None Help needed moving to and from a bed to a chair (including a wheelchair)?: A Little Help needed standing up from a chair using your arms (e.g., wheelchair or bedside chair)?: A Lot Help needed to walk in hospital room?: A Little Help needed climbing 3-5 steps with a railing? : A Little 6 Click Score: 19    End of Session Equipment Utilized During Treatment: Gait belt;Oxygen (2 L O2 via  nasal cannula) Activity Tolerance: Other (comment) (Limited d/t SOB with activity) Patient left: in bed;with call bell/phone within reach;with bed alarm set Nurse Communication: Mobility status;Precautions PT Visit Diagnosis: Other abnormalities of gait and mobility (R26.89);Muscle weakness (generalized) (M62.81);Difficulty in walking, not elsewhere classified (R26.2)    Time: 1610-9604 PT Time Calculation (min) (ACUTE ONLY): 27 min   Charges:   PT Evaluation $PT Eval Low Complexity: 1 Low PT Treatments $Therapeutic Activity: 8-22 mins       Hendricks Limes, PT 12/07/19, 9:43 AM

## 2019-12-08 DIAGNOSIS — E43 Unspecified severe protein-calorie malnutrition: Secondary | ICD-10-CM

## 2019-12-08 DIAGNOSIS — R339 Retention of urine, unspecified: Secondary | ICD-10-CM

## 2019-12-08 LAB — CBC WITH DIFFERENTIAL/PLATELET
Abs Immature Granulocytes: 0.1 10*3/uL — ABNORMAL HIGH (ref 0.00–0.07)
Basophils Absolute: 0.1 10*3/uL (ref 0.0–0.1)
Basophils Relative: 1 %
Eosinophils Absolute: 0.3 10*3/uL (ref 0.0–0.5)
Eosinophils Relative: 2 %
HCT: 31.3 % — ABNORMAL LOW (ref 39.0–52.0)
Hemoglobin: 9.7 g/dL — ABNORMAL LOW (ref 13.0–17.0)
Immature Granulocytes: 1 %
Lymphocytes Relative: 13 %
Lymphs Abs: 1.6 10*3/uL (ref 0.7–4.0)
MCH: 25.9 pg — ABNORMAL LOW (ref 26.0–34.0)
MCHC: 31 g/dL (ref 30.0–36.0)
MCV: 83.5 fL (ref 80.0–100.0)
Monocytes Absolute: 0.9 10*3/uL (ref 0.1–1.0)
Monocytes Relative: 7 %
Neutro Abs: 10 10*3/uL — ABNORMAL HIGH (ref 1.7–7.7)
Neutrophils Relative %: 76 %
Platelets: 489 10*3/uL — ABNORMAL HIGH (ref 150–400)
RBC: 3.75 MIL/uL — ABNORMAL LOW (ref 4.22–5.81)
RDW: 17.2 % — ABNORMAL HIGH (ref 11.5–15.5)
WBC: 13 10*3/uL — ABNORMAL HIGH (ref 4.0–10.5)
nRBC: 0 % (ref 0.0–0.2)

## 2019-12-08 LAB — LACTIC ACID, PLASMA
Lactic Acid, Venous: 1.5 mmol/L (ref 0.5–1.9)
Lactic Acid, Venous: 2.1 mmol/L (ref 0.5–1.9)

## 2019-12-08 LAB — COMPREHENSIVE METABOLIC PANEL
ALT: 13 U/L (ref 0–44)
AST: 21 U/L (ref 15–41)
Albumin: 2.2 g/dL — ABNORMAL LOW (ref 3.5–5.0)
Alkaline Phosphatase: 66 U/L (ref 38–126)
Anion gap: 7 (ref 5–15)
BUN: 8 mg/dL (ref 8–23)
CO2: 22 mmol/L (ref 22–32)
Calcium: 8.8 mg/dL — ABNORMAL LOW (ref 8.9–10.3)
Chloride: 107 mmol/L (ref 98–111)
Creatinine, Ser: 0.72 mg/dL (ref 0.61–1.24)
GFR calc Af Amer: >60 mL/min (ref >60)
GFR calc non Af Amer: >60 mL/min (ref >60)
Glucose, Bld: 84 mg/dL (ref 70–99)
Potassium: 3.3 mmol/L — ABNORMAL LOW (ref 3.5–5.1)
Sodium: 136 mmol/L (ref 135–145)
Total Bilirubin: 0.4 mg/dL (ref 0.3–1.2)
Total Protein: 7.6 g/dL (ref 6.5–8.1)

## 2019-12-08 MED ORDER — CHLORHEXIDINE GLUCONATE CLOTH 2 % EX PADS
6.0000 | MEDICATED_PAD | Freq: Every day | CUTANEOUS | Status: DC
  Administered 2019-12-08 – 2019-12-11 (×4): 6 via TOPICAL

## 2019-12-08 MED ORDER — FERROUS SULFATE 325 (65 FE) MG PO TABS
325.0000 mg | ORAL_TABLET | Freq: Two times a day (BID) | ORAL | Status: DC
  Administered 2019-12-09 – 2019-12-11 (×5): 325 mg via ORAL
  Filled 2019-12-08 (×6): qty 1

## 2019-12-08 MED ORDER — DOCUSATE SODIUM 100 MG PO CAPS
100.0000 mg | ORAL_CAPSULE | Freq: Two times a day (BID) | ORAL | Status: DC
  Administered 2019-12-09 – 2019-12-11 (×4): 100 mg via ORAL
  Filled 2019-12-08 (×6): qty 1

## 2019-12-08 MED ORDER — ALBUTEROL SULFATE (2.5 MG/3ML) 0.083% IN NEBU: 2.5000 mg | INHALATION_SOLUTION | RESPIRATORY_TRACT | Status: DC | PRN

## 2019-12-08 MED ORDER — IPRATROPIUM-ALBUTEROL 0.5-2.5 (3) MG/3ML IN SOLN
3.0000 mL | Freq: Three times a day (TID) | RESPIRATORY_TRACT | Status: DC
  Administered 2019-12-09 – 2019-12-11 (×7): 3 mL via RESPIRATORY_TRACT
  Filled 2019-12-08 (×8): qty 3

## 2019-12-08 MED ORDER — BACID PO TABS: 2.0000 | ORAL_TABLET | Freq: Three times a day (TID) | ORAL | Status: DC

## 2019-12-08 MED ORDER — SODIUM CHLORIDE 0.9 % IV SOLN
300.0000 mg | Freq: Once | INTRAVENOUS | Status: AC
  Administered 2019-12-08: 300 mg via INTRAVENOUS
  Filled 2019-12-08: qty 15

## 2019-12-08 MED ORDER — POTASSIUM CHLORIDE CRYS ER 20 MEQ PO TBCR
40.0000 meq | EXTENDED_RELEASE_TABLET | Freq: Once | ORAL | Status: AC
  Administered 2019-12-08: 40 meq via ORAL
  Filled 2019-12-08: qty 2

## 2019-12-08 NOTE — Progress Notes (Signed)
Cross Cover Note Patient with urinary retension, Bladder scan over 700. Nursing unable to successfully catheterize even with coude. Urology consulted. Sp[oke with Dr. Richardo Hanks over phone who agrees to see patient

## 2019-12-08 NOTE — Care Management Important Message (Signed)
Important Message  Patient Details  Name: Nicholas Alvarez MRN: [id number] Date of Birth: January 25, 1941   Medicare Important Message Given:  Yes     [name] 12/08/2019, 12:30 PM

## 2019-12-08 NOTE — Progress Notes (Addendum)
PROGRESS NOTE    Patient: Nicholas Alvarez                            PCP: No primary care provider on file.                    DOB: 12-22-1940            DOA: 12/06/2019 [id number]:[id number]             DOS: 12/08/2019, 1:03 PM   LOS: 2 days   Date of Service: The patient was seen and examined on 12/08/2019  Subjective:   The patient was seen and examined this morning, complained of generalized weakness, reporting improved cough and shortness of breath. Overnight patient was noted to have a severe urinary retention greater than 700 mL residuals on bladder scan. Per patient he could not void... Urologist was consulted overnight, Foley catheter was placed  He remained stable this morning Weaned off supplemental 2 L of oxygen, satting 96% on room air now   Brief Narrative:   Nicholas Alvarez  is a 79 y.o.  male with a known history of coronary artery disease, dyslipidemia, ischemic cardiomyopathy and hypertension, who presented to the emergency room with acute onset of worsening dyspnea with associated dry cough and wheezing with generalized weakness and fatigue as well as body aches.  The patient had a positive Covid test last month and has been treated.   He was seen by his primary care physician and had a CBC that showed leukocytosis and a chest x-ray that showed pneumonia and therefore was referred to the emergency room.   The patient denied any nausea or vomiting or diarrhea.  He has been constipated.  He denied any fever or chills.  ED: HR was 115 with otherwise normal vital signs.  Pulse ox symmetry however was down to the 80s on room air and came up to the high 90s on 2 L of O2 by nasal cannula.   -Labs revealed unremarkable CMP.  High-sensitivity troponin I was 13 and later 11.  Lactic acid was 1.9 later 2.1 with procalcitonin 0.14.  CBC showed leukocytosis of 18.6 influenza antigens came back negative. COVID-19 PCR came back negative.   Chest x-ray showed Patchy heterogeneous bilateral  lung opacities have progressed from prior exam, most prominently involving the periphery of the left upper lobe. Findings are suspicious for multifocal pneumonia, including COVID-19.  The patient was given IV Rocephin and Zithromax as well as Reglan.  He will be admitted to a medical monitored bed for further evaluation and management   Assessment & Plan:   Active Problems:   Acute hypoxemic respiratory failure due to COVID-19 [facility])   Acute hypoxemic respiratory failure (HCC)   Protein-calorie malnutrition, severe    1.  Multifocal community-acquired pneumonia with subsequent acute hypoxic respiratory failure. -Stable -Sepsis ruled out -On admission patient was started on supplemental oxygen as high as 4 L, was tapered down now on room air satting 96% Improved shortness of breath improved cough  -Improved leukocytosis 18.6, 13.9, 10.3 >> 13.0 today -Lactic acid 2.1 -Hemodynamically stable  -we will continue IV Rocephin and Zithromax. -Mucolytic's will be provided as well as bronchodilator therapy. -We will follow blood and sputum culture. -We will check urine pneumoniae antigens. -The patient was negative for COVID-19.  2.  Essential hypertension. -BP remained stable, continue Coreg, lisinopril and HCTZ.  3.  Dyslipidemia. -We will continue statin  therapy and fish oil. Stable  4.  Depression. -We will continue Celexa. Stable  5.  Acute urinary retention with history of BPH. -We will continue Proscar -Foley catheter was placed overnight 12/08/2019 -Urology was consulted-appreciate input   6.  Anemia of chronic disease, iron deficiency Hemoglobin dropped from 10.4- >> 7.8 >> 9.7 --Total iron 11, iron sat ratio 10, TIBC 115 -Initiating supplement iron  7. Hypokalemia  -Potassium 3.1,  -repleting p.o. potassium supplements   8.  Severe malnutrition  - nutritional status:  Nutrition Problem: Severe Malnutrition Etiology: chronic illness Signs/Symptoms:  energy intake < or equal to 75% for > or equal to 1 month, severe fat depletion, moderate muscle depletion, severe muscle depletion, percent weight loss Percent weight loss: 23 % (47 lbs x 3 months) Interventions: Ensure Enlive (each supplement provides 350kcal and 20 grams of protein), MVI, Liberalize Diet     Cultures; Blood Cultures x 2 >> NGT Sputum culture >>   Antimicrobials: V Rocephin and Zithromax.     Consultants: Urologist   ---------------------------------------------------------------------------------------------------------------------------------------------  DVT prophylaxis:  SCD/Compression stockings and Lovenox SQ Code Status:   Code Status: Full Code Family Communication: No family member present at bedside- attempt will be made to update daily The above findings and plan of care has been discussed with patient (and family )  in detail,  they expressed understanding and agreement of above. -Advance care planning has been discussed.   Admission status:    Status is: Inpatient  Remains inpatient appropriate because:Inpatient level of care appropriate due to severity of illness   Dispo: The patient is from: Home              Anticipated d/c is to: Home with home health              Anticipated d/c date is: 2 days              Patient currently is not medically stable to d/c.        Procedures:   No admission procedures for hospital encounter.     Antimicrobials:  Anti-infectives (From admission, onward)   Start     Dose/Rate Route Frequency Ordered Stop   12/07/19 1000  remdesivir 100 mg in sodium chloride 0.9 % 100 mL IVPB  Status:  Discontinued       "Followed by" Linked Group Details   100 mg 200 mL/hr over 30 Minutes Intravenous Daily 12/06/19 0725 12/06/19 0732   12/07/19 1000  cefTRIAXone (ROCEPHIN) 2 g in sodium chloride 0.9 % 100 mL IVPB        2 g 200 mL/hr over 30 Minutes Intravenous Every 24 hours 12/06/19 0740 12/11/19 0959    12/07/19 1000  azithromycin (ZITHROMAX) 500 mg in sodium chloride 0.9 % 250 mL IVPB  Status:  Discontinued        500 mg 250 mL/hr over 60 Minutes Intravenous Every 24 hours 12/06/19 0740 12/06/19 1229   12/07/19 1000  azithromycin (ZITHROMAX) tablet 500 mg        500 mg Oral Daily 12/06/19 1229 12/11/19 0959   12/06/19 0730  remdesivir 200 mg in sodium chloride 0.9% 250 mL IVPB  Status:  Discontinued       "Followed by" Linked Group Details   200 mg 580 mL/hr over 30 Minutes Intravenous Once 12/06/19 0725 12/06/19 0732   12/06/19 0315  azithromycin (ZITHROMAX) 500 mg in sodium chloride 0.9 % 250 mL IVPB        500 mg  250 mL/hr over 60 Minutes Intravenous  Once 12/06/19 0301 12/06/19 0453   12/06/19 0315  cefTRIAXone (ROCEPHIN) 2 g in sodium chloride 0.9 % 100 mL IVPB        2 g 200 mL/hr over 30 Minutes Intravenous  Once 12/06/19 0301 12/06/19 0356       Medication:  . acidophilus  1 capsule Oral TID  . vitamin C  500 mg Oral Daily  . aspirin  325 mg Oral Daily  . azithromycin  500 mg Oral Daily  . buPROPion  150 mg Oral q morning - 10a  . Chlorhexidine Gluconate Cloth  6 each Topical Daily  . chlorpheniramine-HYDROcodone  5 mL Oral Q12H  . cholecalciferol  1,000 Units Oral Daily  . clopidogrel  75 mg Oral Daily  . DULoxetine  60 mg Oral Daily  . enoxaparin (LOVENOX) injection  40 mg Subcutaneous Q24H  . famotidine  20 mg Oral BID  . feeding supplement (ENSURE ENLIVE)  237 mL Oral BID BM  . finasteride  5 mg Oral Daily  . guaiFENesin  600 mg Oral BID  . ipratropium-albuterol  3 mL Nebulization QID  . magnesium oxide  200 mg Oral Daily  . megestrol  40 mg Oral Daily  . midodrine  5 mg Oral TID WC  . montelukast  10 mg Oral Daily  . multivitamin with minerals  1 tablet Oral Daily  . omega-3 acid ethyl esters  1,000 mg Oral Daily  . simvastatin  40 mg Oral q1800  . tamsulosin  0.4 mg Oral Daily    acetaminophen, fluticasone, guaiFENesin-dextromethorphan, influenza  vaccine adjuvanted, magnesium hydroxide, ondansetron **OR** ondansetron (ZOFRAN) IV, traZODone   Objective:   Vitals:   12/07/19 2003 12/08/19 0349 12/08/19 0902 12/08/19 1248  BP: 106/69 (!) 126/93  115/76  Pulse: 78 92  95  Resp: 20 20  20   Temp: (!) 97.5 F (36.4 C) 98.1 F (36.7 C)  98.4 F (36.9 C)  TempSrc: Oral Oral  Oral  SpO2: 95% 96% 96% 96%  Weight:      Height:        Intake/Output Summary (Last 24 hours) at 12/08/2019 1303 Last data filed at 12/08/2019 0900 Gross per 24 hour  Intake 2457.33 ml  Output 1250 ml  Net 1207.33 ml   Filed Weights   12/05/19 1958 12/07/19 0446  Weight: 63.5 kg 72.4 kg     Examination:      Physical Exam:   General:  Alert, oriented, cooperative, no distress;   HEENT:  Normocephalic, PERRL, otherwise with in Normal limits   Neuro:  CNII-XII intact. , normal motor and sensation, reflexes intact   Lungs:   Clear to auscultation BL, Respirations unlabored, no wheezes / crackles, lower lobe rhonchi  Cardio:    S1/S2, RRR, No murmure, No Rubs or Gallops   Abdomen:   Soft, non-tender, bowel sounds active all four quadrants,  no guarding or peritoneal signs.  Muscular skeletal:   Generalized weaknesses, Limited exam - in bed, able to move all 4 extremities, Normal strength,  2+ pulses,  symmetric, No pitting edema  Skin:  Dry, warm to touch, negative for any Rashes, No open wounds  Wounds: Please see nursing documentation   Foley catheter in place overnight 12/08/2019      ------------------------------------------------------------------------------------------------------------------------------------------    LABs:  CBC Latest Ref Rng & Units 12/08/2019 12/07/2019 12/06/2019  WBC 4.0 - 10.5 K/uL 13.0(H) 10.3 13.9(H)  Hemoglobin 13.0 - 17.0 g/dL 1.6(X9.7(L) 7.8(L) 10.4(L)  Hematocrit 39 - 52 % 31.3(L) 24.7(L) 34.2(L)  Platelets 150 - 400 K/uL 489(H) 362 452(H)   CMP Latest Ref Rng & Units 12/08/2019 12/07/2019 12/05/2019  Glucose  70 - 99 mg/dL 84 93 161(W)  BUN 8 - 23 mg/dL 8 12 13   Creatinine 0.61 - 1.24 mg/dL 9.60) 4.54(U  Sodium 135 - 145 mmol/L 136 138 135  Potassium 3.5 - 5.1 mmol/L 3.3(L) 3.1(L) 4.1  Chloride 98 - 111 mmol/L 107 105 100  CO2 22 - 32 mmol/L 22 24 23   Calcium 8.9 - 10.3 mg/dL 9.81) 8.3(L) 9.6  Total Protein 6.5 - 8.1 g/dL 7.6 6.3(L) -  Total Bilirubin 0.3 - 1.2 mg/dL 0.4 0.5 -  Alkaline Phos 38 - 126 U/L 66 56 -  AST 15 - 41 U/L 21 17 -  ALT 0 - 44 U/L 13 15 -       Micro Results Recent Results (from the past 240 hour(s))  Respiratory Panel by RT PCR (Flu A&B, Covid) - Nasopharyngeal Swab     Status: None   Collection Time: 12/05/19 11:35 PM   Specimen: Nasopharyngeal Swab  Result Value Ref Range Status   SARS Coronavirus 2 by RT PCR NEGATIVE NEGATIVE Final    Comment: (NOTE) SARS-CoV-2 target nucleic acids are NOT DETECTED.  The SARS-CoV-2 RNA is generally detectable in upper respiratoy specimens during the acute phase of infection. The lowest concentration of SARS-CoV-2 viral copies this assay can detect is 131 copies/mL. A negative result does not preclude SARS-Cov-2 infection and should not be used as the sole basis for treatment or other patient management decisions. A negative result may occur with  improper specimen collection/handling, submission of specimen other than nasopharyngeal swab, presence of viral mutation(s) within the areas targeted by this assay, and inadequate number of viral copies (<131 copies/mL). A negative result must be combined with clinical observations, patient history, and epidemiological information. The expected result is Negative.  Fact Sheet for Patients:  1.9(J  Fact Sheet for Healthcare Providers:  12/07/19  This test is no t yet approved or cleared by the https://www.moore.com/ FDA and  has been authorized for detection and/or diagnosis of SARS-CoV-2 by FDA under an  Emergency Use Authorization (EUA). This EUA will remain  in effect (meaning this test can be used) for the duration of the COVID-19 declaration under Section 564(b)(1) of the Act, 21 U.S.C. section 360bbb-3(b)(1), unless the authorization is terminated or revoked sooner.     Influenza A by PCR NEGATIVE NEGATIVE Final   Influenza B by PCR NEGATIVE NEGATIVE Final    Comment: (NOTE) The Xpert Xpress SARS-CoV-2/FLU/RSV assay is intended as an aid in  the diagnosis of influenza from Nasopharyngeal swab specimens and  should not be used as a sole basis for treatment. Nasal washings and  aspirates are unacceptable for Xpert Xpress SARS-CoV-2/FLU/RSV  testing.  Fact Sheet for Patients: https://www.young.biz/  Fact Sheet for Healthcare Providers: [place]  This test is not yet approved or cleared by the https://www.moore.com/ FDA and  has been authorized for detection and/or diagnosis of SARS-CoV-2 by  FDA under an Emergency Use Authorization (EUA). This EUA will remain  in effect (meaning this test can be used) for the duration of the  Covid-19 declaration under Section 564(b)(1) of the Act, 21  U.S.C. section 360bbb-3(b)(1), unless the authorization is  terminated or revoked. Performed at [facility], [address], [place], [address] [place]     Radiology Reports DG Chest 2 View  Result Date: 12/05/2019 CLINICAL DATA:  Shortness of breath. EXAM: CHEST - 2 VIEW COMPARISON:  Radiograph 10/16/2019 FINDINGS: Patchy heterogeneous bilateral lung opacities have progressed from prior exam, most prominently involving the periphery of the left upper lobe. Post median sternotomy and CABG with normal heart size. No pneumothorax or pleural effusion. No acute osseous abnormalities are seen. IMPRESSION: Patchy heterogeneous bilateral lung opacities have progressed from prior exam, most prominently involving the periphery of the left upper lobe.  Findings are suspicious for multifocal pneumonia, including COVID-19. Electronically Signed   By: Narda Rutherford M.D.   On: 12/05/2019 20:49    SIGNED: Kendell Bane, MD, FACP, FHM. [facility],  Pager (please use [email or web address] to page/text)  If 7PM-7AM, please contact night-coverage [email or web address], Password [facility] 12/08/2019, 1:03 PM

## 2019-12-08 NOTE — Consult Note (Signed)
Urology Consult  I have been asked to see the patient by Manuela Schwartz, NP, for evaluation and management of Foley placement.  Chief Complaint: Urinary Retention  History of Present Illness: Nicholas Alvarez is a 79 y.o. year old male who is admitted for multifocal community-acquired pneumonia with subsequent acute hypoxic respiratory failure was unable to urinate.    Bladder scan noted 700 cc in bladder and nursing staff was unable to pass a Foley.   There is no imaging of his prostate.    PSA 04/18/2019 1.05  He was seen by Dr. Irineo Axon in October 2014 for a similar issue.  He presented to the emergency department for acute urinary retention and a 16 French Foley catheter was placed.  He was placed on tamsulosin and finasteride at that time.  He did present to the office for a trial of void, but it appears he did not returned as recommended for further follow-up.  The tamsulosin was stopped sometime in 2019 for unknown reasons.  He states he spontaneously passed 2 stones about a couple years ago.  He states that he starting having difficulty urinating yesterday and then early this am he was completely unable to void.  He is having diarrhea as well.  He has made several attempts to void spontaneously without success.    Patient denies any modifying or aggravating factors.  Patient denies any gross hematuria, dysuria or suprapubic/flank pain.  Patient denies any fevers, chills, nausea or vomiting.   Past Medical History:  Diagnosis Date  . BPH (benign prostatic hyperplasia)   . 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 COVID-19   . Depression   . Dyslipidemia   . Ischemic cardiomyopathy    with left ventricular ejection fraction of 35%    Past Surgical History:  Procedure Laterality Date  . cabg      Home Medications:  No current facility-administered medications on file prior to encounter.    Current Outpatient Medications on File Prior to Encounter  Medication Sig Dispense Refill  . ascorbic acid (VITAMIN C) 1000 MG tablet Take 1,000 mg by mouth daily.    Marland Kitchen aspirin 325 MG EC tablet Take 325 mg by mouth daily.      Marland Kitchen buPROPion (WELLBUTRIN XL) 150 MG 24 hr tablet Take 150 mg by mouth every morning.    . clopidogrel (PLAVIX) 75 MG tablet Take 75 mg by mouth daily.    . DULoxetine (CYMBALTA) 60 MG capsule Take 60 mg by mouth daily.    . finasteride (PROSCAR) 5 MG tablet Take 5 mg by mouth daily.    . fluticasone (FLONASE) 50 MCG/ACT nasal spray Place 1 spray into both nostrils 2 (two) times daily.    . hydrochlorothiazide (HYDRODIURIL) 12.5 MG tablet Take 12.5 mg by mouth daily.    Marland Kitchen losartan (COZAAR) 50 MG tablet Take 50 mg by mouth daily.    . Magnesium (V-R MAGNESIUM) 250 MG TABS Take 250 mg by mouth daily.    . megestrol (MEGACE) 40 MG tablet Take 40 mg by mouth daily.    . montelukast (SINGULAIR) 10 MG tablet Take 10 mg by mouth daily.    . Omega-3 1000 MG CAPS Take 2 g by mouth daily.    . simvastatin (ZOCOR) 40 MG tablet Take 40 mg by mouth at bedtime.      . tamsulosin (FLOMAX) 0.4 MG CAPS capsule Take 0.4 mg  by mouth daily.      Allergies:  Allergies  Allergen Reactions  . Ace Inhibitors Cough    Family History  Problem Relation Age of Onset  . Coronary artery disease Other   . Heart attack Sister 43  . Heart attack Maternal Grandfather 40  . Heart attack Paternal Grandfather 40  . COPD Mother   . Pneumonia Father   . Cancer Sister     Social History:  reports that he has quit smoking. He has never used smokeless tobacco. He reports current alcohol use. He reports that he does not use drugs.  ROS: A complete review of systems was performed.  All systems are negative except for pertinent findings as noted.  Physical Exam:  Vital signs in last 24 hours: Temp:  [97.5 F (36.4 C)-98.1 F (36.7 C)] 98.1 F (36.7 C) (10/01 0349) Pulse Rate:  [78-92] 92  (10/01 0349) Resp:  [18-20] 20 (10/01 0349) BP: (96-126)/(56-93) 126/93 (10/01 0349) SpO2:  [94 %-98 %] 96 % (10/01 0349) Constitutional:  Alert and oriented, No acute distress.  Elderly appearing.  HEENT: [place] AT, moist mucus membranes.  Trachea midline, no masses Cardiovascular: Regular rate and rhythm, no clubbing, cyanosis, or edema. Respiratory: Normal respiratory effort, lungs clear bilaterally GI: Abdomen is soft, nontender, nondistended, no abdominal masses GU: No CVA tenderness.  Circumcised.  Normal phallus.  Scrotum without lesions or edema.  Testicles located scrotal he bilaterally.  They are atrophic Skin: No rashes, bruises or suspicious lesions Lymph: No inguinal adenopathy Neurologic: Grossly intact, no focal deficits, moving all 4 extremities Psychiatric: Normal mood and affect   Laboratory Data:  Recent Labs    12/06/19 0951 12/07/19 0541 12/08/19 0504  WBC 13.9* 10.3 13.0*  HGB 10.4* 7.8* 9.7*  HCT 34.2* 24.7* 31.3*   Recent Labs    12/05/19 2001 12/07/19 0541 12/08/19 0504  NA 135 138 136  K 4.1 3.1* 3.3*  CL 100 105 107  CO2 23 24 22   GLUCOSE 103* 93 84  BUN 13 12 8   CREATININE 0.74 0.59* 0.72  CALCIUM 9.6 8.3* 8.8*   No results for input(s): LABPT, INR in the last 72 hours. No results for input(s): LABURIN in the last 72 hours. Results for orders placed or performed during the hospital encounter of 12/06/19  Respiratory Panel by RT PCR (Flu A&B, Covid) - Nasopharyngeal Swab     Status: None   Collection Time: 12/05/19 11:35 PM   Specimen: Nasopharyngeal Swab  Result Value Ref Range Status   SARS Coronavirus 2 by RT PCR NEGATIVE NEGATIVE Final    Comment: (NOTE) SARS-CoV-2 target nucleic acids are NOT DETECTED.  The SARS-CoV-2 RNA is generally detectable in upper respiratoy specimens during the acute phase of infection. The lowest concentration of SARS-CoV-2 viral copies this assay can detect is 131 copies/mL. A negative result does not  preclude SARS-Cov-2 infection and should not be used as the sole basis for treatment or other patient management decisions. A negative result may occur with  improper specimen collection/handling, submission of specimen other than nasopharyngeal swab, presence of viral mutation(s) within the areas targeted by this assay, and inadequate number of viral copies (<131 copies/mL). A negative result must be combined with clinical observations, patient history, and epidemiological information. The expected result is Negative.  Fact Sheet for Patients:  12/08/19  Fact Sheet for Healthcare Providers:  12/07/19  This test is no t yet approved or cleared by the https://www.moore.com/ and  has been authorized  for detection and/or diagnosis of SARS-CoV-2 by FDA under an Emergency Use Authorization (EUA). This EUA will remain  in effect (meaning this test can be used) for the duration of the COVID-19 declaration under Section 564(b)(1) of the Act, 21 U.S.C. section 360bbb-3(b)(1), unless the authorization is terminated or revoked sooner.     Influenza A by PCR NEGATIVE NEGATIVE Final   Influenza B by PCR NEGATIVE NEGATIVE Final    Comment: (NOTE) The Xpert Xpress SARS-CoV-2/FLU/RSV assay is intended as an aid in  the diagnosis of influenza from Nasopharyngeal swab specimens and  should not be used as a sole basis for treatment. Nasal washings and  aspirates are unacceptable for Xpert Xpress SARS-CoV-2/FLU/RSV  testing.  Fact Sheet for Patients: https://www.moore.com/  Fact Sheet for Healthcare Providers: https://www.young.biz/  This test is not yet approved or cleared by the [place] FDA and  has been authorized for detection and/or diagnosis of SARS-CoV-2 by  FDA under an Emergency Use Authorization (EUA). This EUA will remain  in effect (meaning this test can be used) for the  duration of the  Covid-19 declaration under Section 564(b)(1) of the Act, 21  U.S.C. section 360bbb-3(b)(1), unless the authorization is  terminated or revoked. Performed at [facility], [address] [place], [place] [address]      Radiologic Imaging: No results found.   Simple Catheter Placement Due to urinary retention patient is present today for a foley cath placement.  Patient was cleaned and prepped in a sterile fashion with betadine. A 18 FR Coude foley catheter was inserted, urine return was noted  500 ml, urine was yellow in color.  The balloon was filled with 10cc of sterile water.  An over night bag was attached for drainage.  Patient tolerated well, no complications were noted    Impression/Assessment:  79 year old elderly male admitted for multifocal community-acquired pneumonia with subsequent acute hypoxic respiratory failure who went into urinary retention during admission.  Bladder scan noted a PVR of 700 cc and nursing staff unable to place catheter.  I was able to place an 5 [place] coud catheter without complication and urine was yellow clear.  He does have a history of BPH and has been on finasteride and tamsulosin in the past.  He also had an episode of urinary retention in 2014.    Plan:  -Recommend starting tamsulosin 0.4 mg daily and and continuing finasteride 5 mg daily -Patient will need an outpatient voiding trial in 1 to 2 weeks  12/08/2019, 9:03 AM  Michiel Cowboy, PA-C   Thank you for involving me in this patient's care.  Please page with any further questions or concerns.  Rhealynn Myhre

## 2019-12-09 LAB — COMPREHENSIVE METABOLIC PANEL
ALT: 11 U/L (ref 0–44)
AST: 11 U/L — ABNORMAL LOW (ref 15–41)
Albumin: 1.9 g/dL — ABNORMAL LOW (ref 3.5–5.0)
Alkaline Phosphatase: 56 U/L (ref 38–126)
Anion gap: 6 (ref 5–15)
BUN: 6 mg/dL — ABNORMAL LOW (ref 8–23)
CO2: 24 mmol/L (ref 22–32)
Calcium: 8.8 mg/dL — ABNORMAL LOW (ref 8.9–10.3)
Chloride: 105 mmol/L (ref 98–111)
Creatinine, Ser: 0.53 mg/dL — ABNORMAL LOW (ref 0.61–1.24)
GFR calc Af Amer: >60 mL/min (ref >60)
GFR calc non Af Amer: >60 mL/min (ref >60)
Glucose, Bld: 85 mg/dL (ref 70–99)
Potassium: 3.5 mmol/L (ref 3.5–5.1)
Sodium: 135 mmol/L (ref 135–145)
Total Bilirubin: 0.3 mg/dL (ref 0.3–1.2)
Total Protein: 6.6 g/dL (ref 6.5–8.1)

## 2019-12-09 LAB — CBC WITH DIFFERENTIAL/PLATELET
Abs Immature Granulocytes: 0.09 10*3/uL — ABNORMAL HIGH (ref 0.00–0.07)
Basophils Absolute: 0 10*3/uL (ref 0.0–0.1)
Basophils Relative: 0 %
Eosinophils Absolute: 0.2 10*3/uL (ref 0.0–0.5)
Eosinophils Relative: 2 %
HCT: 27.2 % — ABNORMAL LOW (ref 39.0–52.0)
Hemoglobin: 8.8 g/dL — ABNORMAL LOW (ref 13.0–17.0)
Immature Granulocytes: 1 %
Lymphocytes Relative: 15 %
Lymphs Abs: 1.5 10*3/uL (ref 0.7–4.0)
MCH: 26.4 pg (ref 26.0–34.0)
MCHC: 32.4 g/dL (ref 30.0–36.0)
MCV: 81.7 fL (ref 80.0–100.0)
Monocytes Absolute: 0.8 10*3/uL (ref 0.1–1.0)
Monocytes Relative: 7 %
Neutro Abs: 7.8 10*3/uL — ABNORMAL HIGH (ref 1.7–7.7)
Neutrophils Relative %: 75 %
Platelets: 425 10*3/uL — ABNORMAL HIGH (ref 150–400)
RBC: 3.33 MIL/uL — ABNORMAL LOW (ref 4.22–5.81)
RDW: 17.1 % — ABNORMAL HIGH (ref 11.5–15.5)
WBC: 10.4 10*3/uL (ref 4.0–10.5)
nRBC: 0 % (ref 0.0–0.2)

## 2019-12-09 LAB — LEGIONELLA PNEUMOPHILA SEROGP 1 UR AG: L. pneumophila Serogp 1 Ur Ag: NEGATIVE

## 2019-12-09 MED ORDER — POTASSIUM CHLORIDE CRYS ER 20 MEQ PO TBCR
40.0000 meq | EXTENDED_RELEASE_TABLET | Freq: Once | ORAL | Status: AC
  Administered 2019-12-09: 40 meq via ORAL
  Filled 2019-12-09: qty 2

## 2019-12-09 NOTE — Progress Notes (Signed)
PROGRESS NOTE    Patient: Nicholas Alvarez                            PCP: No primary care provider on file.                    DOB: Jul 15, 1940            DOA: 12/06/2019 RUE:[id number]             DOS: 12/09/2019, 10:43 AM   LOS: 3 days   Date of Service: The patient was seen and examined on 12/09/2019  Subjective:   The patient was seen and examined this morning, remained stable no acute distress Complained of generalized weaknesses. Foley catheter still in place, reluctant regarding it discontinuation plan   Brief Narrative:   Nicholas Alvarez  is a 79 y.o.  male with a known history of coronary artery disease, dyslipidemia, ischemic cardiomyopathy and hypertension, who presented to the emergency room with acute onset of worsening dyspnea with associated dry cough and wheezing with generalized weakness and fatigue as well as body aches.  The patient had a positive Covid test last month and has been treated.   He was seen by his primary care physician and had a CBC that showed leukocytosis and a chest x-ray that showed pneumonia and therefore was referred to the emergency room.   The patient denied any nausea or vomiting or diarrhea.  He has been constipated.  He denied any fever or chills.  ED: HR was 115 with otherwise normal vital signs.  Pulse ox symmetry however was down to the 80s on room air and came up to the high 90s on 2 L of O2 by nasal cannula.   -Labs revealed unremarkable CMP.  High-sensitivity troponin I was 13 and later 11.  Lactic acid was 1.9 later 2.1 with procalcitonin 0.14.  CBC showed leukocytosis of 18.6 influenza antigens came back negative. COVID-19 PCR came back negative.   Chest x-ray showed Patchy heterogeneous bilateral lung opacities have progressed from prior exam, most prominently involving the periphery of the left upper lobe. Findings are suspicious for multifocal pneumonia, including COVID-19.  The patient was given IV Rocephin and Zithromax as well as  Reglan.  He will be admitted to a medical monitored bed for further evaluation and management   Assessment & Plan:   Active Problems:   Acute hypoxemic respiratory failure due to COVID-19 [facility](HCC)   Acute hypoxemic respiratory failure (HCC)   Protein-calorie malnutrition, severe    1.  Multifocal community-acquired pneumonia with subsequent acute hypoxic respiratory failure. Sepsis was ruled out, hemodynamically stable  -Remains on room air, has been weaned off oxygen satting 93% Improved shortness of breath improved cough  -Improved leukocytosis 18.6, 13.9, 10.3 >> 13.0 >> 10.4 today -Lactic acid 3.4 >> 2.1 -Hemodynamically stable  -we will continue IV Rocephin and Zithromax. -Mucolytic's will be provided as well as bronchodilator therapy. -We will follow blood and sputum culture. -We will check urine pneumoniae antigens. -The patient was negative for COVID-19.  2.  Essential hypertension. -Stable, continue Coreg, lisinopril and HCTZ.  3.  Dyslipidemia. -We will continue statin therapy and fish oil. -Remained stable  4.  Depression. -We will continue Celexa. Stable no behavior issues  5.  Acute urinary retention with history of BPH. -We will continue Proscar -Foley catheter was placed overnight 12/08/2019 -Appreciate urology following: Added tamsulosin 0.4 mg daily, finasteride 5 mg  daily Will follow-up as an outpatient in 1-2 weeks   6.  Anemia of chronic disease, iron deficiency Hemoglobin dropped from 10.4- >> 7.8 >> 9.7 >>8.8 --Total iron 11, iron sat ratio 10, TIBC 115 -Initiating supplement iron  7. Hypokalemia  -Potassium 3.1,  -repleting p.o. potassium supplements  8.  Severe malnutrition  - nutritional status:  Nutrition Problem: Severe Malnutrition Etiology: chronic illness Signs/Symptoms: energy intake < or equal to 75% for > or equal to 1 month, severe fat depletion, moderate muscle depletion, severe muscle depletion, percent weight  loss Percent weight loss: 23 % (47 lbs x 3 months) Interventions: Ensure Enlive (each supplement provides 350kcal and 20 grams of protein), MVI, Liberalize Diet     Cultures; Blood Cultures x 2 >> NGT Sputum culture >>   Antimicrobials: V Rocephin and Zithromax.     Consultants: Urologist   ---------------------------------------------------------------------------------------------------------------------------------------------  DVT prophylaxis:  SCD/Compression stockings and Lovenox SQ Code Status:   Code Status: Full Code Family Communication: No family member present at bedside- attempt will be made to update daily The above findings and plan of care has been discussed with patient (and family )  in detail,  they expressed understanding and agreement of above. -Advance care planning has been discussed.   Admission status:    Status is: Inpatient  Remains inpatient appropriate because:Inpatient level of care appropriate due to severity of illness   Dispo: The patient is from: Home              Anticipated d/c is to: Home with home health              Anticipated d/c date is: 2 days              Patient currently is not medically stable to d/c.        Procedures:   No admission procedures for hospital encounter.     Antimicrobials:  Anti-infectives (From admission, onward)   Start     Dose/Rate Route Frequency Ordered Stop   12/07/19 1000  remdesivir 100 mg in sodium chloride 0.9 % 100 mL IVPB  Status:  Discontinued       "Followed by" Linked Group Details   100 mg 200 mL/hr over 30 Minutes Intravenous Daily 12/06/19 0725 12/06/19 0732   12/07/19 1000  cefTRIAXone (ROCEPHIN) 2 g in sodium chloride 0.9 % 100 mL IVPB        2 g 200 mL/hr over 30 Minutes Intravenous Every 24 hours 12/06/19 0740 12/11/19 0959   12/07/19 1000  azithromycin (ZITHROMAX) 500 mg in sodium chloride 0.9 % 250 mL IVPB  Status:  Discontinued        500 mg 250 mL/hr over 60  Minutes Intravenous Every 24 hours 12/06/19 0740 12/06/19 1229   12/07/19 1000  azithromycin (ZITHROMAX) tablet 500 mg        500 mg Oral Daily 12/06/19 1229 12/11/19 0959   12/06/19 0730  remdesivir 200 mg in sodium chloride 0.9% 250 mL IVPB  Status:  Discontinued       "Followed by" Linked Group Details   200 mg 580 mL/hr over 30 Minutes Intravenous Once 12/06/19 0725 12/06/19 0732   12/06/19 0315  azithromycin (ZITHROMAX) 500 mg in sodium chloride 0.9 % 250 mL IVPB        500 mg 250 mL/hr over 60 Minutes Intravenous  Once 12/06/19 0301 12/06/19 0453   12/06/19 0315  cefTRIAXone (ROCEPHIN) 2 g in sodium chloride 0.9 % 100 mL IVPB  2 g 200 mL/hr over 30 Minutes Intravenous  Once 12/06/19 0301 12/06/19 0356       Medication:   acidophilus  1 capsule Oral TID   vitamin C  500 mg Oral Daily   aspirin  325 mg Oral Daily   azithromycin  500 mg Oral Daily   buPROPion  150 mg Oral q morning - 10a   Chlorhexidine Gluconate Cloth  6 each Topical Daily   chlorpheniramine-HYDROcodone  5 mL Oral Q12H   cholecalciferol  1,000 Units Oral Daily   clopidogrel  75 mg Oral Daily   docusate sodium  100 mg Oral BID   DULoxetine  60 mg Oral Daily   enoxaparin (LOVENOX) injection  40 mg Subcutaneous Q24H   famotidine  20 mg Oral BID   feeding supplement (ENSURE ENLIVE)  237 mL Oral BID BM   ferrous sulfate  325 mg Oral BID WC   finasteride  5 mg Oral Daily   guaiFENesin  600 mg Oral BID   ipratropium-albuterol  3 mL Nebulization TID   magnesium oxide  200 mg Oral Daily   megestrol  40 mg Oral Daily   midodrine  5 mg Oral TID WC   montelukast  10 mg Oral Daily   multivitamin with minerals  1 tablet Oral Daily   omega-3 acid ethyl esters  1,000 mg Oral Daily   potassium chloride  40 mEq Oral Once   simvastatin  40 mg Oral q1800   tamsulosin  0.4 mg Oral Daily    acetaminophen, albuterol, fluticasone, guaiFENesin-dextromethorphan, influenza vaccine adjuvanted,  magnesium hydroxide, ondansetron **OR** ondansetron (ZOFRAN) IV, traZODone   Objective:   Vitals:   12/08/19 2312 12/09/19 0602 12/09/19 0830 12/09/19 0838  BP: 117/68 124/82  105/65  Pulse: 85 90  86  Resp: 16 15  18   Temp: 98.1 F (36.7 C) 98.1 F (36.7 C) 98.3 F (36.8 C)   TempSrc: Oral Oral Oral   SpO2: 95% 95%  93%  Weight:      Height:        Intake/Output Summary (Last 24 hours) at 12/09/2019 1043 Last data filed at 12/09/2019 02/08/2020 Gross per 24 hour  Intake 1133.04 ml  Output 2850 ml  Net -1716.96 ml   Filed Weights   12/05/19 1958 12/07/19 0446  Weight: 63.5 kg 72.4 kg     Examination:       Physical Exam:   General:  Alert, oriented, cooperative, no distress;   HEENT:  Normocephalic, PERRL, otherwise with in Normal limits   Neuro:  CNII-XII intact. , normal motor and sensation, reflexes intact   Lungs:   Clear to auscultation BL, Respirations unlabored, no wheezes / crackles  Cardio:    S1/S2, RRR, No murmure, No Rubs or Gallops   Abdomen:   Soft, non-tender, bowel sounds active all four quadrants,  no guarding or peritoneal signs.  Muscular skeletal:  Limited exam - in bed, able to move all 4 extremities, Normal strength,  2+ pulses,  symmetric, No pitting edema  Skin:  Dry, warm to touch, negative for any Rashes, No open wounds  Wounds: Please see nursing documentation       Foley catheter in place overnight 12/08/2019      ------------------------------------------------------------------------------------------------------------------------------------------    LABs:  CBC Latest Ref Rng & Units 12/09/2019 12/08/2019 12/07/2019  WBC 4.0 - 10.5 K/uL 10.4 13.0(H) 10.3  Hemoglobin 13.0 - 17.0 g/dL 12/09/2019) 5.0(D) 7.8(L)  Hematocrit 39 - 52 % 27.2(L) 31.3(L) 24.7(L)  Platelets  150 - 400 K/uL 425(H) 489(H) 362   CMP Latest Ref Rng & Units 12/09/2019 12/08/2019 12/07/2019  Glucose 70 - 99 mg/dL 85 84 93  BUN 8 - 23 mg/dL 6(L) 8 12  Creatinine 6.29 -  1.24 mg/dL 5.28(U) 1.32 4.40(N)  Sodium 135 - 145 mmol/L 135 136 138  Potassium 3.5 - 5.1 mmol/L 3.5 3.3(L) 3.1(L)  Chloride 98 - 111 mmol/L 105 107 105  CO2 22 - 32 mmol/L 24 22 24   Calcium 8.9 - 10.3 mg/dL ) 0.2(V) 8.3(L)  Total Protein 6.5 - 8.1 g/dL 6.6 7.6 6.3(L)  Total Bilirubin 0.3 - 1.2 mg/dL 0.3 0.4 0.5  Alkaline Phos 38 - 126 U/L 56 66 56  AST 15 - 41 U/L 11(L) 21 17  ALT 0 - 44 U/L 11 13 15        Micro Results Recent Results (from the past 240 hour(s))  Respiratory Panel by RT PCR (Flu A&B, Covid) - Nasopharyngeal Swab     Status: None   Collection Time: 12/05/19 11:35 PM   Specimen: Nasopharyngeal Swab  Result Value Ref Range Status   SARS Coronavirus 2 by RT PCR NEGATIVE NEGATIVE Final    Comment: (NOTE) SARS-CoV-2 target nucleic acids are NOT DETECTED.  The SARS-CoV-2 RNA is generally detectable in upper respiratoy specimens during the acute phase of infection. The lowest concentration of SARS-CoV-2 viral copies this assay can detect is 131 copies/mL. A negative result does not preclude SARS-Cov-2 infection and should not be used as the sole basis for treatment or other patient management decisions. A negative result may occur with  improper specimen collection/handling, submission of specimen other than nasopharyngeal swab, presence of viral mutation(s) within the areas targeted by this assay, and inadequate number of viral copies (<131 copies/mL). A negative result must be combined with clinical observations, patient history, and epidemiological information. The expected result is Negative.  Fact Sheet for Patients:   Fact Sheet for Healthcare Providers:  12/07/19  This test is no t yet approved or cleared by the https://www.moore.com/ FDA and  has been authorized for detection and/or diagnosis of SARS-CoV-2 by FDA under an Emergency Use Authorization (EUA). This EUA will remain  in  effect (meaning this test can be used) for the duration of the COVID-19 declaration under Section 564(b)(1) of the Act, 21 U.S.C. section 360bbb-3(b)(1), unless the authorization is terminated or revoked sooner.     Influenza A by PCR NEGATIVE NEGATIVE Final   Influenza B by PCR NEGATIVE NEGATIVE Final    Comment: (NOTE) The Xpert Xpress SARS-CoV-2/FLU/RSV assay is intended as an aid in  the diagnosis of influenza from Nasopharyngeal swab specimens and  should not be used as a sole basis for treatment. Nasal washings and  aspirates are unacceptable for Xpert Xpress SARS-CoV-2/FLU/RSV  testing.  Fact Sheet for Patients: https://www.young.biz/  Fact Sheet for Healthcare Providers: [place]  This test is not yet approved or cleared by the https://www.moore.com/ FDA and  has been authorized for detection and/or diagnosis of SARS-CoV-2 by  FDA under an Emergency Use Authorization (EUA). This EUA will remain  in effect (meaning this test can be used) for the duration of the  Covid-19 declaration under Section 564(b)(1) of the Act, 21  U.S.C. section 360bbb-3(b)(1), unless the authorization is  terminated or revoked. Performed at [facility], [address], [place], [address] [place]     Radiology Reports DG Chest 2 View  Result Date: 12/05/2019 CLINICAL DATA:  Shortness of breath.  EXAM: CHEST - 2 VIEW COMPARISON:  Radiograph 10/16/2019 FINDINGS: Patchy heterogeneous bilateral lung opacities have progressed from prior exam, most prominently involving the periphery of the left upper lobe. Post median sternotomy and CABG with normal heart size. No pneumothorax or pleural effusion. No acute osseous abnormalities are seen. IMPRESSION: Patchy heterogeneous bilateral lung opacities have progressed from prior exam, most prominently involving the periphery of the left upper lobe. Findings are suspicious for multifocal pneumonia, including  COVID-19. Electronically Signed   By: Narda Rutherford M.D.   On: 12/05/2019 20:49    SIGNED: Kendell Bane, MD, FACP, FHM. [facility],  Pager (please use [email or web address] to page/text)  If 7PM-7AM, please contact night-coverage [email or web address] 12/09/2019, 10:43 AM

## 2019-12-10 ENCOUNTER — Other Ambulatory Visit: Payer: Self-pay

## 2019-12-10 DIAGNOSIS — I959 Hypotension, unspecified: Secondary | ICD-10-CM | POA: Diagnosis present

## 2019-12-10 DIAGNOSIS — R338 Other retention of urine: Secondary | ICD-10-CM | POA: Diagnosis not present

## 2019-12-10 LAB — CBC WITH DIFFERENTIAL/PLATELET
Abs Immature Granulocytes: 0.09 10*3/uL — ABNORMAL HIGH (ref 0.00–0.07)
Basophils Absolute: 0.1 10*3/uL (ref 0.0–0.1)
Basophils Relative: 1 %
Eosinophils Absolute: 0.4 10*3/uL (ref 0.0–0.5)
Eosinophils Relative: 4 %
HCT: 27.2 % — ABNORMAL LOW (ref 39.0–52.0)
Hemoglobin: 9 g/dL — ABNORMAL LOW (ref 13.0–17.0)
Immature Granulocytes: 1 %
Lymphocytes Relative: 22 %
Lymphs Abs: 1.8 10*3/uL (ref 0.7–4.0)
MCH: 26.9 pg (ref 26.0–34.0)
MCHC: 33.1 g/dL (ref 30.0–36.0)
MCV: 81.2 fL (ref 80.0–100.0)
Monocytes Absolute: 0.8 10*3/uL (ref 0.1–1.0)
Monocytes Relative: 10 %
Neutro Abs: 5.4 10*3/uL (ref 1.7–7.7)
Neutrophils Relative %: 62 %
Platelets: 430 10*3/uL — ABNORMAL HIGH (ref 150–400)
RBC: 3.35 MIL/uL — ABNORMAL LOW (ref 4.22–5.81)
RDW: 17.4 % — ABNORMAL HIGH (ref 11.5–15.5)
WBC: 8.5 10*3/uL (ref 4.0–10.5)
nRBC: 0 % (ref 0.0–0.2)

## 2019-12-10 LAB — COMPREHENSIVE METABOLIC PANEL
ALT: 10 U/L (ref 0–44)
AST: 12 U/L — ABNORMAL LOW (ref 15–41)
Albumin: 2 g/dL — ABNORMAL LOW (ref 3.5–5.0)
Alkaline Phosphatase: 55 U/L (ref 38–126)
Anion gap: 7 (ref 5–15)
BUN: 8 mg/dL (ref 8–23)
CO2: 24 mmol/L (ref 22–32)
Calcium: 9.1 mg/dL (ref 8.9–10.3)
Chloride: 105 mmol/L (ref 98–111)
Creatinine, Ser: 0.69 mg/dL (ref 0.61–1.24)
GFR calc Af Amer: >60 mL/min (ref >60)
GFR calc non Af Amer: >60 mL/min (ref >60)
Glucose, Bld: 80 mg/dL (ref 70–99)
Potassium: 3.9 mmol/L (ref 3.5–5.1)
Sodium: 136 mmol/L (ref 135–145)
Total Bilirubin: 0.5 mg/dL (ref 0.3–1.2)
Total Protein: 6.7 g/dL (ref 6.5–8.1)

## 2019-12-10 MED ORDER — LEVOFLOXACIN 750 MG PO TABS
750.0000 mg | ORAL_TABLET | Freq: Every day | ORAL | Status: DC
  Administered 2019-12-10 – 2019-12-11 (×2): 750 mg via ORAL
  Filled 2019-12-10 (×2): qty 1

## 2019-12-10 NOTE — Progress Notes (Signed)
PROGRESS NOTE    Patient: Nicholas Alvarez                            PCP: No primary care provider on file.                    DOB: 1940-11-30            DOA: 12/06/2019 KUV:[id number]             DOS: 12/10/2019, 11:00 AM   LOS: 4 days   Date of Service: The patient was seen and examined on 12/10/2019  Subjective:   The patient was seen and examined this morning, stable no acute distress Foley catheter still in place, reluctant for it to be discontinued-fear of unable to void  He was encouraged that we would like to discontinue it he is on 2 medications now that would able to make him void hopefully.  Hemodynamically stable no issues overnight Off oxygen on room air satting 95%   Brief Narrative:   Jaydenn Boccio  is a 79 y.o.  male with a known history of coronary artery disease, dyslipidemia, ischemic cardiomyopathy and hypertension, who presented to the emergency room with acute onset of worsening dyspnea with associated dry cough and wheezing with generalized weakness and fatigue as well as body aches.  The patient had a positive Covid test last month and has been treated.   He was seen by his primary care physician and had a CBC that showed leukocytosis and a chest x-ray that showed pneumonia and therefore was referred to the emergency room.   The patient denied any nausea or vomiting or diarrhea.  He has been constipated.  He denied any fever or chills.  ED: HR was 115 with otherwise normal vital signs.  Pulse ox symmetry however was down to the 80s on room air and came up to the high 90s on 2 L of O2 by nasal cannula.   -Labs revealed unremarkable CMP.  High-sensitivity troponin I was 13 and later 11.  Lactic acid was 1.9 later 2.1 with procalcitonin 0.14.  CBC showed leukocytosis of 18.6 influenza antigens came back negative. COVID-19 PCR came back negative.   Chest x-ray showed Patchy heterogeneous bilateral lung opacities have progressed from prior exam, most prominently  involving the periphery of the left upper lobe. Findings are suspicious for multifocal pneumonia, including COVID-19.  The patient was given IV Rocephin and Zithromax as well as Reglan.  He will be admitted to a medical monitored bed for further evaluation and management   Assessment & Plan:   Active Problems:   Acute hypoxemic respiratory failure due to COVID-19 [facility])   Acute hypoxemic respiratory failure (HCC)   Protein-calorie malnutrition, severe    1.  Multifocal community-acquired pneumonia with subsequent acute hypoxic respiratory failure. Sepsis ruled out,  Hemodynamically stable  -Satting 95% on room air  -Improved leukocytosis 18.6, 13.9, 10.3 >> 13.0 >> 10.4 >> 8.5 today -Lactic acid 3.4 >> 2.1   -we will continue IV Rocephin and Zithromax.... Discontinued 12/10/2019 Initiating p.o. Levaquin -Mucolytic's will be provided as well as bronchodilator therapy. -We will follow blood and sputum culture... No growth to date -We will check urine pneumoniae antigens... Negative -The patient was negative for COVID-19.  2. History of hypotension On Midrin Discontinue lisinopril HCTZ Continue Coreg   3.  Dyslipidemia. -Continue statins   4.  Depression. -We will continue Celexa. Stable no behavior  issues  5.  Acute urinary retention with history of BPH. -Continue tamsulosin/finasteride -Foley catheter was placed overnight 12/08/2019 >> DC'd 12/10/2019 -Appreciate urology following: Added tamsulosin 0.4 mg daily, finasteride 5 mg daily Will follow-up as an outpatient in 1-2 weeks   6.  Anemia of chronic disease, iron deficiency Hemoglobin dropped from 10.4- >> 7.8 >> 9.7 >>8.8 >> 9.0 --Total iron 11, iron sat ratio 10, TIBC 115 -Initiating supplement iron  7. Hypokalemia  -Potassium 3.1 >>> 3.9 -repleting p.o. potassium supplements  8.  Severe malnutrition  - nutritional status:  Nutrition Problem: Severe Malnutrition Etiology: chronic  illness Signs/Symptoms: energy intake < or equal to 75% for > or equal to 1 month, severe fat depletion, moderate muscle depletion, severe muscle depletion, percent weight loss Percent weight loss: 23 % (47 lbs x 3 months) Interventions: Ensure Enlive (each supplement provides 350kcal and 20 grams of protein), MVI, Liberalize Diet     Cultures; Blood Cultures x 2 >> NGT Sputum culture >>   Antimicrobials: V Rocephin and Zithromax... DC 12/10/2019 12/10/2019 p.o. Levaquin     Consultants: Urologist   ---------------------------------------------------------------------------------------------------------------------------------------------  DVT prophylaxis:  SCD/Compression stockings and Lovenox SQ Code Status:   Code Status: Full Code Family Communication: No family member present at bedside- attempt will be made to update daily The above findings and plan of care has been discussed with patient (and family )  in detail,  they expressed understanding and agreement of above. -Advance care planning has been discussed.   Admission status:    Status is: Inpatient  Remains inpatient appropriate because:Inpatient level of care appropriate due to severity of illness   Dispo: The patient is from: Home              Anticipated d/c is to: Home with home health              Anticipated d/c date is: In a.m.              Patient currently is not medically stable to d/c.        Procedures:   No admission procedures for hospital encounter.     Antimicrobials:  Anti-infectives (From admission, onward)   Start     Dose/Rate Route Frequency Ordered Stop   12/10/19 1100  levofloxacin (LEVAQUIN) tablet 750 mg        750 mg Oral Daily 12/10/19 1059     12/07/19 1000  remdesivir 100 mg in sodium chloride 0.9 % 100 mL IVPB  Status:  Discontinued       "Followed by" Linked Group Details   100 mg 200 mL/hr over 30 Minutes Intravenous Daily 12/06/19 0725 12/06/19 0732   12/07/19  1000  cefTRIAXone (ROCEPHIN) 2 g in sodium chloride 0.9 % 100 mL IVPB  Status:  Discontinued        2 g 200 mL/hr over 30 Minutes Intravenous Every 24 hours 12/06/19 0740 12/10/19 1059   12/07/19 1000  azithromycin (ZITHROMAX) 500 mg in sodium chloride 0.9 % 250 mL IVPB  Status:  Discontinued        500 mg 250 mL/hr over 60 Minutes Intravenous Every 24 hours 12/06/19 0740 12/06/19 1229   12/07/19 1000  azithromycin (ZITHROMAX) tablet 500 mg        500 mg Oral Daily 12/06/19 1229 12/10/19 0831   12/06/19 0730  remdesivir 200 mg in sodium chloride 0.9% 250 mL IVPB  Status:  Discontinued       "Followed by" Linked Group Details  200 mg 580 mL/hr over 30 Minutes Intravenous Once 12/06/19 0725 12/06/19 0732   12/06/19 0315  azithromycin (ZITHROMAX) 500 mg in sodium chloride 0.9 % 250 mL IVPB        500 mg 250 mL/hr over 60 Minutes Intravenous  Once 12/06/19 0301 12/06/19 0453   12/06/19 0315  cefTRIAXone (ROCEPHIN) 2 g in sodium chloride 0.9 % 100 mL IVPB        2 g 200 mL/hr over 30 Minutes Intravenous  Once 12/06/19 0301 12/06/19 0356       Medication:  . acidophilus  1 capsule Oral TID  . vitamin C  500 mg Oral Daily  . aspirin  325 mg Oral Daily  . buPROPion  150 mg Oral q morning - 10a  . Chlorhexidine Gluconate Cloth  6 each Topical Daily  . chlorpheniramine-HYDROcodone  5 mL Oral Q12H  . cholecalciferol  1,000 Units Oral Daily  . clopidogrel  75 mg Oral Daily  . docusate sodium  100 mg Oral BID  . DULoxetine  60 mg Oral Daily  . enoxaparin (LOVENOX) injection  40 mg Subcutaneous Q24H  . famotidine  20 mg Oral BID  . feeding supplement (ENSURE ENLIVE)  237 mL Oral BID BM  . ferrous sulfate  325 mg Oral BID WC  . finasteride  5 mg Oral Daily  . guaiFENesin  600 mg Oral BID  . ipratropium-albuterol  3 mL Nebulization TID  . levofloxacin  750 mg Oral Daily  . magnesium oxide  200 mg Oral Daily  . megestrol  40 mg Oral Daily  . midodrine  5 mg Oral TID WC  . montelukast  10  mg Oral Daily  . multivitamin with minerals  1 tablet Oral Daily  . omega-3 acid ethyl esters  1,000 mg Oral Daily  . simvastatin  40 mg Oral q1800  . tamsulosin  0.4 mg Oral Daily    acetaminophen, albuterol, fluticasone, guaiFENesin-dextromethorphan, influenza vaccine adjuvanted, magnesium hydroxide, ondansetron **OR** ondansetron (ZOFRAN) IV, traZODone   Objective:   Vitals:   12/09/19 1937 12/09/19 2344 12/10/19 0435 12/10/19 0834  BP: 100/72 93/64 106/69 119/70  Pulse: 89 91 85 81  Resp: Temp: 98.6 F (37 C) 97.7 F (36.5 C) 97.6 F (36.4 C) 97.6 F (36.4 C)  TempSrc: Oral Oral Oral Oral  SpO2: 94% 95% 94% 95%  Weight:      Height:        Intake/Output Summary (Last 24 hours) at 12/10/2019 1100 Last data filed at 12/10/2019 1031 Gross per 24 hour  Intake 580.06 ml  Output 2450 ml  Net -1869.94 ml   Filed Weights   12/05/19 1958 12/07/19 0446  Weight: 63.5 kg 72.4 kg     Examination:         Physical Exam:   General:  Alert, oriented, cooperative, no distress;   HEENT:  Normocephalic, PERRL, otherwise with in Normal limits   Neuro:  CNII-XII intact. , normal motor and sensation, reflexes intact   Lungs:   Clear to auscultation BL, Respirations unlabored, no wheezes / crackles  Cardio:    S1/S2, RRR, No murmure, No Rubs or Gallops   Abdomen:   Soft, non-tender, bowel sounds active all four quadrants,  no guarding or peritoneal signs.  Muscular skeletal:  Limited exam - in bed, able to move all 4 extremities, Normal strength,  2+ pulses,  symmetric, No pitting edema  Skin:  Dry, warm to touch, negative for any  Rashes, No open wounds  Wounds: Please see nursing documentation           Foley catheter in place overnight 12/08/2019      ------------------------------------------------------------------------------------------------------------------------------------------    LABs:  CBC Latest Ref Rng & Units 12/10/2019 12/09/2019  12/08/2019  WBC 4.0 - 10.5 K/uL 8.5 10.4 13.0(H)  Hemoglobin 13.0 - 17.0 g/dL 9.0(L) 8.8(L) 9.7(L)  Hematocrit 39 - 52 % 27.2(L) 27.2(L) 31.3(L)  Platelets 150 - 400 K/uL 430(H) 425(H) 489(H)   CMP Latest Ref Rng & Units 12/10/2019 12/09/2019 12/08/2019  Glucose 70 - 99 mg/dL 80 85 84  BUN 8 - 23 mg/dL 8 6(L) 8  Creatinine 1.47 - 1.24 mg/dL 8.29 5.62(Z) 3.08  Sodium 135 - 145 mmol/L 136 135 136  Potassium 3.5 - 5.1 mmol/L 3.9 3.5 3.3(L)  Chloride 98 - 111 mmol/L 105 105 107  CO2 22 - 32 mmol/L 24 24 22   Calcium 8.9 - 10.3 mg/dL 9.1 ) 6.5(H)  Total Protein 6.5 - 8.1 g/dL 6.7 6.6 7.6  Total Bilirubin 0.3 - 1.2 mg/dL 0.5 0.3 0.4  Alkaline Phos 38 - 126 U/L 55 56 66  AST 15 - 41 U/L 12(L) 11(L) 21  ALT 0 - 44 U/L 10 11 13        Micro Results Recent Results (from the past 240 hour(s))  Respiratory Panel by RT PCR (Flu A&B, Covid) - Nasopharyngeal Swab     Status: None   Collection Time: 12/05/19 11:35 PM   Specimen: Nasopharyngeal Swab  Result Value Ref Range Status   SARS Coronavirus 2 by RT PCR NEGATIVE NEGATIVE Final    Comment: (NOTE) SARS-CoV-2 target nucleic acids are NOT DETECTED.  The SARS-CoV-2 RNA is generally detectable in upper respiratoy specimens during the acute phase of infection. The lowest concentration of SARS-CoV-2 viral copies this assay can detect is 131 copies/mL. A negative result does not preclude SARS-Cov-2 infection and should not be used as the sole basis for treatment or other patient management decisions. A negative result may occur with  improper specimen collection/handling, submission of specimen other than nasopharyngeal swab, presence of viral mutation(s) within the areas targeted by this assay, and inadequate number of viral copies (<131 copies/mL). A negative result must be combined with clinical observations, patient history, and epidemiological information. The expected result is Negative.  Fact Sheet for Patients:    Fact Sheet for Healthcare Providers:  12/07/19  This test is no t yet approved or cleared by the https://www.moore.com/ FDA and  has been authorized for detection and/or diagnosis of SARS-CoV-2 by FDA under an Emergency Use Authorization (EUA). This EUA will remain  in effect (meaning this test can be used) for the duration of the COVID-19 declaration under Section 564(b)(1) of the Act, 21 U.S.C. section 360bbb-3(b)(1), unless the authorization is terminated or revoked sooner.     Influenza A by PCR NEGATIVE NEGATIVE Final   Influenza B by PCR NEGATIVE NEGATIVE Final    Comment: (NOTE) The Xpert Xpress SARS-CoV-2/FLU/RSV assay is intended as an aid in  the diagnosis of influenza from Nasopharyngeal swab specimens and  should not be used as a sole basis for treatment. Nasal washings and  aspirates are unacceptable for Xpert Xpress SARS-CoV-2/FLU/RSV  testing.  Fact Sheet for Patients: https://www.young.biz/  Fact Sheet for Healthcare Providers: [place]  This test is not yet approved or cleared by the https://www.moore.com/ FDA and  has been authorized for detection and/or diagnosis of SARS-CoV-2 by  FDA under an Emergency  Use Authorization (EUA). This EUA will remain  in effect (meaning this test can be used) for the duration of the  Covid-19 declaration under Section 564(b)(1) of the Act, 21  U.S.C. section 360bbb-3(b)(1), unless the authorization is  terminated or revoked. Performed at [facility], [address] [place], [place] [address]     Radiology Reports DG Chest 2 View  Result Date: 12/05/2019 CLINICAL DATA:  Shortness of breath. EXAM: CHEST - 2 VIEW COMPARISON:  Radiograph 10/16/2019 FINDINGS: Patchy heterogeneous bilateral lung opacities have progressed from prior exam, most prominently involving the periphery of the left upper lobe. Post  median sternotomy and CABG with normal heart size. No pneumothorax or pleural effusion. No acute osseous abnormalities are seen. IMPRESSION: Patchy heterogeneous bilateral lung opacities have progressed from prior exam, most prominently involving the periphery of the left upper lobe. Findings are suspicious for multifocal pneumonia, including COVID-19. Electronically Signed   By: Narda Rutherford M.D.   On: 12/05/2019 20:49    SIGNED: Kendell Bane, MD, FACP, FHM. [facility],  Pager (please use [email or web address] to page/text)  If 7PM-7AM, please contact night-coverage [email or web address], Password [facility] 12/10/2019, 11:00 AM

## 2019-12-10 NOTE — Progress Notes (Signed)
Mobility Specialist - Progress Note   12/10/19 1611  Therapy Vitals  Pulse Rate (!) 111  BP 90/70  Mobility  Activity Ambulated in room;Dangled on edge of bed  Range of Motion/Exercises Right leg;Left leg (ankle pumps, straight leg raises)  Level of Assistance Standby assist, set-up cues, supervision of patient - no hands on  Assistive Device Front wheel walker  Distance Ambulated (ft) 20 ft  Mobility Response Tolerated well  Mobility performed by Mobility specialist  $Mobility charge 1 Mobility    Pre-mobility: 108 HR, 96% SpO2 During mobility: 130 HR, 96% SpO2 Post-mobility: 120 HR, 93% SpO2   Pt was lying in bed upon arrival utilizing room air. Pt agreed to session. Pt was modI getting EOB where he performed STS and HR increased to 130 bpm. Pt needing a seated break began performing EOB exercises: straight leg raises and ankle pumps (10x/leg) with no physical assistance. Pt attempted STS for a second time SBA and HR sat at 120 bpm. Pt ambulated to door and back to bed with SBA. HR increased to 124 bpm and O2 desat to 93%. Pt denied SOB. Upon getting back to bed, pt c/o feeling winded rating it a 4/10. Overall, pt tolerated session well. Pt was left in bed with all needs in reach.    Filiberto Pinks Mobility Specialist 12/10/19, 4:56 PM

## 2019-12-10 NOTE — Progress Notes (Signed)
   12/10/19 1210  Assess: MEWS Score  Temp 98.1 F (36.7 C)  BP 92/65  Pulse Rate (!) 109  Resp 16  Level of Consciousness Alert  SpO2 96 %  Assess: MEWS Score  MEWS Temp 0  MEWS Systolic 1  MEWS Pulse 1  MEWS RR 0  MEWS LOC 0  MEWS Score 2  MEWS Score Color Yellow  Assess: if the MEWS score is Yellow or Red  Were vital signs taken at a resting state? Yes  Focused Assessment Change from prior assessment (see assessment flowsheet)  Take Vital Signs  Increase Vital Sign Frequency  Yellow: Q 2hr X 2 then Q 4hr X 2, if remains yellow, continue Q 4hrs  Escalate  MEWS: Escalate Yellow: discuss with charge nurse/RN and consider discussing with provider and RRT  Notify: Charge Nurse/RN  Name of Charge Nurse/RN Notified Olivia R  Notify: Provider  Provider Name/Title Dr Damita Dunnings  Date Provider Notified 12/10/19  Time Provider Notified 1258  Notification Type Page  Notification Reason Change in status

## 2019-12-11 DIAGNOSIS — R338 Other retention of urine: Secondary | ICD-10-CM

## 2019-12-11 LAB — COMPREHENSIVE METABOLIC PANEL
ALT: 9 U/L (ref 0–44)
AST: 14 U/L — ABNORMAL LOW (ref 15–41)
Albumin: 2.1 g/dL — ABNORMAL LOW (ref 3.5–5.0)
Alkaline Phosphatase: 55 U/L (ref 38–126)
Anion gap: 7 (ref 5–15)
BUN: 12 mg/dL (ref 8–23)
CO2: 24 mmol/L (ref 22–32)
Calcium: 9.5 mg/dL (ref 8.9–10.3)
Chloride: 103 mmol/L (ref 98–111)
Creatinine, Ser: 0.61 mg/dL (ref 0.61–1.24)
GFR calc Af Amer: >60 mL/min (ref >60)
GFR calc non Af Amer: >60 mL/min (ref >60)
Glucose, Bld: 85 mg/dL (ref 70–99)
Potassium: 3.9 mmol/L (ref 3.5–5.1)
Sodium: 134 mmol/L — ABNORMAL LOW (ref 135–145)
Total Bilirubin: 0.5 mg/dL (ref 0.3–1.2)
Total Protein: 7.1 g/dL (ref 6.5–8.1)

## 2019-12-11 LAB — CBC WITH DIFFERENTIAL/PLATELET
Abs Immature Granulocytes: 0.12 10*3/uL — ABNORMAL HIGH (ref 0.00–0.07)
Basophils Absolute: 0 10*3/uL (ref 0.0–0.1)
Basophils Relative: 0 %
Eosinophils Absolute: 0.2 10*3/uL (ref 0.0–0.5)
Eosinophils Relative: 3 %
HCT: 28.5 % — ABNORMAL LOW (ref 39.0–52.0)
Hemoglobin: 9 g/dL — ABNORMAL LOW (ref 13.0–17.0)
Immature Granulocytes: 1 %
Lymphocytes Relative: 20 %
Lymphs Abs: 1.9 10*3/uL (ref 0.7–4.0)
MCH: 26.5 pg (ref 26.0–34.0)
MCHC: 31.6 g/dL (ref 30.0–36.0)
MCV: 84.1 fL (ref 80.0–100.0)
Monocytes Absolute: 0.8 10*3/uL (ref 0.1–1.0)
Monocytes Relative: 9 %
Neutro Abs: 6.3 10*3/uL (ref 1.7–7.7)
Neutrophils Relative %: 67 %
Platelets: 427 10*3/uL — ABNORMAL HIGH (ref 150–400)
RBC: 3.39 MIL/uL — ABNORMAL LOW (ref 4.22–5.81)
RDW: 17.6 % — ABNORMAL HIGH (ref 11.5–15.5)
WBC: 9.4 10*3/uL (ref 4.0–10.5)
nRBC: 0 % (ref 0.0–0.2)

## 2019-12-11 MED ORDER — ASPIRIN 325 MG PO TBEC: 325.0000 mg | DELAYED_RELEASE_TABLET | Freq: Every day | ORAL | 0 refills | Status: DC

## 2019-12-11 MED ORDER — ADULT MULTIVITAMIN W/MINERALS CH: 1.0000 | ORAL_TABLET | Freq: Every day | ORAL | 0 refills | Status: AC

## 2019-12-11 MED ORDER — LEVOFLOXACIN 750 MG PO TABS: 750.0000 mg | ORAL_TABLET | Freq: Every day | ORAL | 0 refills | Status: AC

## 2019-12-11 MED ORDER — ENSURE ENLIVE PO LIQD: 237.0000 mL | Freq: Two times a day (BID) | ORAL | 2 refills | Status: AC

## 2019-12-11 MED ORDER — RISAQUAD PO CAPS: 1.0000 | ORAL_CAPSULE | Freq: Three times a day (TID) | ORAL | 0 refills | Status: AC

## 2019-12-11 MED ORDER — GUAIFENESIN-DM 100-10 MG/5ML PO SYRP: 10.0000 mL | ORAL_SOLUTION | ORAL | 0 refills | Status: AC | PRN

## 2019-12-11 MED ORDER — FERROUS SULFATE 325 (65 FE) MG PO TABS: 325.0000 mg | ORAL_TABLET | Freq: Two times a day (BID) | ORAL | 2 refills | Status: DC

## 2019-12-11 MED ORDER — MIDODRINE HCL 5 MG PO TABS: 5.0000 mg | ORAL_TABLET | Freq: Three times a day (TID) | ORAL | 0 refills | Status: AC

## 2019-12-11 MED ORDER — VITAMIN D3 25 MCG PO TABS: 1000.0000 [IU] | ORAL_TABLET | Freq: Every day | ORAL | 2 refills | Status: AC

## 2019-12-11 MED ORDER — INFLUENZA VAC A&B SA ADJ QUAD 0.5 ML IM PRSY: 0.5000 mL | PREFILLED_SYRINGE | Freq: Once | INTRAMUSCULAR | 0 refills | Status: AC

## 2019-12-11 NOTE — Care Management Important Message (Signed)
Important Message  Patient Details  Name: Nicholas Alvarez MRN: [id number] Date of Birth: [birth year]   Medicare Important Message Given:  Yes     [name] 12/11/2019, 12:55 PM

## 2019-12-11 NOTE — Progress Notes (Signed)
Occupational Therapy Treatment Patient Details Name: Nicholas Alvarez MRN: [id number] DOB: 03-13-1940 Today's Date: 12/11/2019    History of present illness Pt is a 79 y.o. male presenting to the hospital 9/28 with acute onset worsening dyspnea with associated dry cough, wheezing, generalized weakness, fatigue, and body aches.  Per chart (+) COVID test last month (8/9 at [facility] ED).  Pt was sent to ED d/t PNA and increased WBC count.  Pt admitted to hospital with multifocal community acquired PNA with subsequent acute hypoxic respiratory failure.  PMH includes CAD, dyslipidemia, ischemia cardiomyopathy, and htn.   OT comments  Nicholas Alvarez is making good progress toward his functional goals.  Pt was pleasant and agreeable to today's session focused on energy conservation strategies to ensure safety, independence, and well being after discharge.  Pt reports he is looking forward to doing things independently at home.  OTR provided pt with education and handout re: various energy conservation strategies including activity modifications, compensatory strategies, adaptive equipment, pursed lip breathing, and monitoring SpO2 level.  Pt also educated on importance of prioritizing tasks and taking frequent rest breaks.  Pt verbalized understanding of today's education and shows good safety awareness.  Nicholas Alvarez will continue to benefit from skilled OT services in acute setting to address energy conservation strategies, endurance, and safety and independence in ADLs.  HHOT remains most appropriate discharge recommendation.   Follow Up Recommendations  Home health OT;Supervision - Intermittent    Equipment Recommendations  None recommended by OT    Recommendations for Other Services      Precautions / Restrictions Precautions Precautions: Fall Restrictions Weight Bearing Restrictions: No       Mobility Bed Mobility Overal bed mobility: Modified Independent                Transfers                       Balance                                           ADL either performed or assessed with clinical judgement   ADL Overall ADL's : Needs assistance/impaired Eating/Feeding: Set up;Sitting   Grooming: Wash/dry hands;Wash/dry face;Oral care;Sitting;Min guard   Upper Body Bathing: Set up;Sitting   Lower Body Bathing: Min guard;Sitting/lateral leans       Lower Body Dressing: Min guard;Sit to/from stand                 General ADL Comments: Pt remains min guard-intermittent min A for basic ADLs     Vision Patient Visual Report: No change from baseline     Perception     Praxis      Cognition Arousal/Alertness: Awake/alert Behavior During Therapy: WFL for tasks assessed/performed Overall Cognitive Status: Within Functional Limits for tasks assessed                                 General Comments: grossly oriented, engaged throughout evaluation        Exercises Other Exercises Other Exercises: provided education re: energy conservation strategies including activity modifications, pursed lip breathing, monitoring SpO2 level, and compensatory strategies   Shoulder Instructions       General Comments SpO2 = 93% on RA    Pertinent Vitals/ Pain  Pain Assessment: No/denies pain  Home Living                                          Prior Functioning/Environment              Frequency  Min 1X/week        Progress Toward Goals  OT Goals(current goals can now be found in the care plan section)  Progress towards OT goals: Progressing toward goals  Acute Rehab OT Goals Patient Stated Goal: to do things independently OT Goal Formulation: With patient Time For Goal Achievement: 12/21/19 Potential to Achieve Goals: Good  Plan Discharge plan remains appropriate;Frequency remains appropriate    Co-evaluation                 AM-PAC OT "6 Clicks" Daily Activity      Outcome Measure   Help from another person eating meals?: None Help from another person taking care of personal grooming?: A Little Help from another person toileting, which includes using toliet, bedpan, or urinal?: A Little Help from another person bathing (including washing, rinsing, drying)?: A Little Help from another person to put on and taking off regular upper body clothing?: A Little Help from another person to put on and taking off regular lower body clothing?: A Little 6 Click Score: 19    End of Session    OT Visit Diagnosis: Unsteadiness on feet (R26.81);Muscle weakness (generalized) (M62.81)   Activity Tolerance Patient tolerated treatment well   Patient Left in bed;with call bell/phone within reach;with bed alarm set   Nurse Communication          Time: 1540-0867 OT Time Calculation (min): 28 min  Charges: OT General Charges $OT Visit: 1 Visit OT Treatments $Self Care/Home Management : 8-22 mins  Kathyrn Drown Ksean Vale, OTR/L 12/11/19, 12:00 PM

## 2019-12-11 NOTE — Progress Notes (Signed)
AVS instructions given to patient. All instruction and medications explained to patient. Patient verbalized understanding. No further questions or concerns at this time.   Luvenia Starch, RN

## 2019-12-11 NOTE — Discharge Summary (Signed)
Physician Discharge Summary [facility]    Patient: Nicholas Alvarez                   Admit date: 12/06/2019   DOB: 30-Aug-1940             Discharge date:12/11/2019/9:13 AM AVW:[id number]                          PCP: No primary care provider on file.  Disposition: Home with home health  Recommendations for Outpatient Follow-up:   Follow up: in 2 week to follow with PCP, urology  Discharge Condition: Stable   Code Status:   Code Status: Full Code  Diet recommendation: Cardiac diet   Discharge Diagnoses:    Active Problems:   CAD, ARTERY BYPASS GRAFT   Acute hypoxemic respiratory failure due to COVID-19 [facility])   Acute hypoxemic respiratory failure (HCC)   Protein-calorie malnutrition, severe   Hypotension   Acute urinary retention   History of Present Illness/ Hospital Course [name] Summary:    Nicholas Alvarez a79 y.o.malewith a known history of coronary artery disease, dyslipidemia, ischemic cardiomyopathy and hypertension, who presented to the emergency room with acute onset ofworsening dyspnea with associated dry cough and wheezing with generalized weakness and fatigue as well as body aches. The patient had a positive Covid test last month and has been treated.   He was seen by his primary care physician and had a CBC that showed leukocytosis and a chest x-ray that showed pneumonia and therefore was referred to the emergency room.  The patient denied any nausea or vomiting or diarrhea. He has been constipated. He denied any fever or chills.  ED: HR was 115 with otherwise normal vital signs. Pulse ox symmetry however was down to the 80s on room air and came up to the high 90s on 2 L of O2 by nasal cannula.  -Labs revealed unremarkable CMP. High-sensitivity troponin I was 13 and later 11. Lactic acid was 1.9 later 2.1 with procalcitonin 0.14. CBC showed leukocytosis of 18.6 influenza antigens came back negative. COVID-19 PCR came back negative.   Chest x-ray showed Patchy heterogeneous bilateral lung opacities have progressed from prior exam, most prominently involving the periphery of the left upper lobe. Findings are suspicious for multifocal pneumonia, including COVID-19.  The patient was given IV Rocephin and Zithromax as well as Reglan. He will be admitted to a medical monitored bed for further evaluation and management  rition, severe    1. Multifocal community-acquired pneumonia with subsequent acute hypoxic respiratory failure. Sepsis ruled out,  Remained hemodynamically stable  -Satting 95% on room air  -Improved leukocytosis 18.6, 13.9, 10.3 >> 13.0 >> 10.4 >> 8.5 today -Lactic acid 3.4 >> 2.1   -we will continue IV Rocephin and Zithromax.... Discontinued 12/10/2019 Initiating p.o. Levaquin for 5 more days -Mucolytic's will be provided as well as bronchodilator therapy. -We will follow blood and sputum culture... No growth to date -We will check urine pneumoniae antigens... Negative -The patient was negative for COVID-19.  2. History of hypotension On Midrin Discontinue lisinopril HCTZ    3. Dyslipidemia. -Continue statins   4. Depression. -We will continue Celexa. Stable no behavior issues  5.  Acute urinary retention with history ofBPH. -Continue tamsulosin/finasteride -Foley catheter was placed overnight 12/08/2019 >> DC'd 12/10/2019 -Appreciate urology following: Added tamsulosin 0.4 mg daily, finasteride 5 mg daily Will follow-up as an outpatient in 1-2 weeks   6.  Anemia of  chronic disease, iron deficiency Hemoglobin dropped from 10.4- >> 7.8 >> 9.7 >>8.8 >> 9.0 --Total iron 11, iron sat ratio 10, TIBC 115 -Initiating supplement iron  7. Hypokalemia  -Potassium 3.1 >>> 3.9 -repleting p.o. potassium supplements  8.  Severe malnutrition  - nutritional status:  Nutrition Problem: Severe Malnutrition Etiology: chronic illness Signs/Symptoms: energy intake < or equal  to 75% for > or equal to 1 month, severe fat depletion, moderate muscle depletion, severe muscle depletion, percent weight loss Percent weight loss: 23 % (47 lbs x 3 months) Interventions: Ensure Enlive (each supplement provides 350kcal and 20 grams of protein), MVI, Liberalize Diet     Cultures; Blood Cultures x 2 >> NGT Sputum culture >>   Antimicrobials: V Rocephin and Zithromax... DC 12/10/2019 12/10/2019 p.o. Levaquin     Consultants: Urologist     Nutritional status:  Nutrition Problem: Severe Malnutrition Etiology: chronic illness Signs/Symptoms: energy intake < or equal to 75% for > or equal to 1 month, severe fat depletion, moderate muscle depletion, severe muscle depletion, percent weight loss Percent weight loss: 23 % (47 lbs x 3 months) Interventions: Ensure Enlive (each supplement provides 350kcal and 20 grams of protein), MVI, Liberalize Diet   Discharge Instructions:   Discharge Instructions    Activity as tolerated - No restrictions   Complete by: As directed    Call MD for:  difficulty breathing, headache or visual disturbances   Complete by: As directed    Call MD for:  temperature >100.4   Complete by: As directed    Diet - low sodium heart healthy   Complete by: As directed    Discharge instructions   Complete by: As directed    Follow-up with PCP urologist within 1 to 2 weeks..   Increase activity slowly   Complete by: As directed        Medication List    STOP taking these medications   ascorbic acid 1000 MG tablet Commonly known as: VITAMIN C   buPROPion 150 MG 24 hr tablet Commonly known as: WELLBUTRIN XL   hydrochlorothiazide 12.5 MG tablet Commonly known as: HYDRODIURIL   losartan 50 MG tablet Commonly known as: COZAAR   Omega-3 1000 MG Caps   V-R MAGNESIUM 250 MG Tabs Generic drug: Magnesium     TAKE these medications   acidophilus Caps capsule Take 1 capsule by mouth 3 (three) times daily for 10 days.    aspirin 325 MG EC tablet Take 1 tablet (325 mg total) by mouth daily.   clopidogrel 75 MG tablet Commonly known as: PLAVIX Take 75 mg by mouth daily.   DULoxetine 60 MG capsule Commonly known as: CYMBALTA Take 60 mg by mouth daily.   feeding supplement (ENSURE ENLIVE) Liqd Take 237 mLs by mouth 2 (two) times daily between meals.   ferrous sulfate 325 (65 FE) MG tablet Take 1 tablet (325 mg total) by mouth 2 (two) times daily with a meal.   finasteride 5 MG tablet Commonly known as: PROSCAR Take 5 mg by mouth daily.   fluticasone 50 MCG/ACT nasal spray Commonly known as: FLONASE Place 1 spray into both nostrils 2 (two) times daily.   guaiFENesin-dextromethorphan 100-10 MG/5ML syrup Commonly known as: ROBITUSSIN DM Take 10 mLs by mouth every 4 (four) hours as needed for up to 10 days for cough.   influenza vaccine adjuvanted 0.5 ML injection Commonly known as: FLUAD Inject 0.5 mLs into the muscle once for 1 dose.   levofloxacin 750 MG tablet Commonly  known as: LEVAQUIN Take 1 tablet (750 mg total) by mouth daily for 5 days.   megestrol 40 MG tablet Commonly known as: MEGACE Take 40 mg by mouth daily.   midodrine 5 MG tablet Commonly known as: PROAMATINE Take 1 tablet (5 mg total) by mouth 3 (three) times daily with meals.   montelukast 10 MG tablet Commonly known as: SINGULAIR Take 10 mg by mouth daily.   multivitamin with minerals Tabs tablet Take 1 tablet by mouth daily.   simvastatin 40 MG tablet Commonly known as: ZOCOR Take 40 mg by mouth at bedtime.   tamsulosin 0.4 MG Caps capsule Commonly known as: FLOMAX Take 0.4 mg by mouth daily.   Vitamin D3 25 MCG tablet Commonly known as: Vitamin D Take 1 tablet (1,000 Units total) by mouth daily.       Allergies  Allergen Reactions  . Ace Inhibitors Cough     Procedures /Studies:   DG Chest 2 View  Result Date: 12/05/2019 CLINICAL DATA:  Shortness of breath. EXAM: CHEST - 2 VIEW COMPARISON:   Radiograph 10/16/2019 FINDINGS: Patchy heterogeneous bilateral lung opacities have progressed from prior exam, most prominently involving the periphery of the left upper lobe. Post median sternotomy and CABG with normal heart size. No pneumothorax or pleural effusion. No acute osseous abnormalities are seen. IMPRESSION: Patchy heterogeneous bilateral lung opacities have progressed from prior exam, most prominently involving the periphery of the left upper lobe. Findings are suspicious for multifocal pneumonia, including COVID-19. Electronically Signed   By: Narda Rutherford M.D.   On: 12/05/2019 20:49     Subjective:   Patient was seen and examined 12/11/2019, 9:13 AM Patient stable today. No acute distress.  No issues overnight Stable for discharge.  Discharge Exam:    Vitals:   12/10/19 1957 12/10/19 2347 12/11/19 0545 12/11/19 0759  BP: 107/75 111/80 (!) 93/57 109/74  Pulse: 99 (!) 101 93 90  Resp: 19 17 20 18   Temp: 98.1 F (36.7 C) 98.3 F (36.8 C) 97.7 F (36.5 C) 98.4 F (36.9 C)  TempSrc:   Oral Oral  SpO2: 95% 96% 94% 95%  Weight:      Height:        General: Pt lying comfortably in bed & appears in no obvious distress. Cardiovascular: S1 & S2 heard, RRR, S1/S2 +. No murmurs, rubs, gallops or clicks. No JVD or pedal edema. Respiratory: Clear to auscultation without wheezing, rhonchi or crackles. No increased work of breathing. Abdominal:  Non-distended, non-tender & soft. No organomegaly or masses appreciated. Normal bowel sounds heard. CNS: Alert and oriented. No focal deficits. Extremities: no edema, no cyanosis    The results of significant diagnostics from this hospitalization (including imaging, microbiology, ancillary and laboratory) are listed below for reference.      Microbiology:   Recent Results (from the past 240 hour(s))  Respiratory Panel by RT PCR (Flu A&B, Covid) - Nasopharyngeal Swab     Status: None   Collection Time: 12/05/19 11:35 PM    Specimen: Nasopharyngeal Swab  Result Value Ref Range Status   SARS Coronavirus 2 by RT PCR NEGATIVE NEGATIVE Final    Comment: (NOTE) SARS-CoV-2 target nucleic acids are NOT DETECTED.  The SARS-CoV-2 RNA is generally detectable in upper respiratoy specimens during the acute phase of infection. The lowest concentration of SARS-CoV-2 viral copies this assay can detect is 131 copies/mL. A negative result does not preclude SARS-Cov-2 infection and should not be used as the sole basis for treatment or  other patient management decisions. A negative result may occur with  improper specimen collection/handling, submission of specimen other than nasopharyngeal swab, presence of viral mutation(s) within the areas targeted by this assay, and inadequate number of viral copies (<131 copies/mL). A negative result must be combined with clinical observations, patient history, and epidemiological information. The expected result is Negative.  Fact Sheet for Patients:  https://www.moore.com/  Fact Sheet for Healthcare Providers:  https://www.young.biz/  This test is no t yet approved or cleared by the [place] FDA and  has been authorized for detection and/or diagnosis of SARS-CoV-2 by FDA under an Emergency Use Authorization (EUA). This EUA will remain  in effect (meaning this test can be used) for the duration of the COVID-19 declaration under Section 564(b)(1) of the Act, 21 U.S.C. section 360bbb-3(b)(1), unless the authorization is terminated or revoked sooner.     Influenza A by PCR NEGATIVE NEGATIVE Final   Influenza B by PCR NEGATIVE NEGATIVE Final    Comment: (NOTE) The Xpert Xpress SARS-CoV-2/FLU/RSV assay is intended as an aid in  the diagnosis of influenza from Nasopharyngeal swab specimens and  should not be used as a sole basis for treatment. Nasal washings and  aspirates are unacceptable for Xpert Xpress SARS-CoV-2/FLU/RSV   testing.  Fact Sheet for Patients: https://www.moore.com/  Fact Sheet for Healthcare Providers: https://www.young.biz/  This test is not yet approved or cleared by the [place] FDA and  has been authorized for detection and/or diagnosis of SARS-CoV-2 by  FDA under an Emergency Use Authorization (EUA). This EUA will remain  in effect (meaning this test can be used) for the duration of the  Covid-19 declaration under Section 564(b)(1) of the Act, 21  U.S.C. section 360bbb-3(b)(1), unless the authorization is  terminated or revoked. Performed at [facility], [address] [place], [place] [address]      Labs:   CBC: Recent Labs  Lab 12/07/19 [phone] 12/08/19 0504 12/09/19 0527 12/10/19 0431 12/11/19 0541  WBC 10.3 13.0* 10.4 8.5 9.4  NEUTROABS 7.6 10.0* 7.8* 5.4 6.3  HGB 7.8* 9.7* 8.8* 9.0* 9.0*  HCT 24.7* 31.3* 27.2* 27.2* 28.5*  MCV 83.4 83.5 81.7 81.2 84.1  PLT 362 489* 425* 430* 427*   Basic Metabolic Panel: Recent Labs  Lab 12/07/19 0541 12/08/19 0504 12/09/19 0527 12/10/19 0431 12/11/19 0541  NA 138 136 135 136 134*  K 3.1* 3.3* 3.5 3.9 3.9  CL 105 107 105 105 103  CO2 24 22 24 24 24   GLUCOSE 93 84 85 80 85  BUN 12 8 6* 8 12  CREATININE 0.59* 0.72 0.53* 0.69 0.61  CALCIUM 8.3* 8.8* 8.8* 9.1 9.5   Liver Function Tests: Recent Labs  Lab 12/07/19 0541 12/08/19 0504 12/09/19 0527 12/10/19 0431 12/11/19 0541  AST 17 21 11* 12* 14*  ALT 15 13 11 10 9   ALKPHOS 56 66 56 55 55  BILITOT 0.5 0.4 0.3 0.5 0.5  PROT 6.3* 7.6 6.6 6.7 7.1  ALBUMIN 1.8* 2.2* 1.9* 2.0* 2.1*   BNP (last 3 results) No results for input(s): BNP in the last 8760 hours. Cardiac Enzymes: No results for input(s): CKTOTAL, CKMB, CKMBINDEX, TROPONINI in the last 168 hours. CBG: No results for input(s): GLUCAP in the last 168 hours. Hgb A1c No results for input(s): HGBA1C in the last 72 hours. Lipid Profile No results for  input(s): CHOL, HDL, LDLCALC, TRIG, CHOLHDL, LDLDIRECT in the last 72 hours. Thyroid function studies No results for input(s): TSH, T4TOTAL, T3FREE, THYROIDAB  in the last 72 hours.  Invalid input(s): FREET3 Anemia work up No results for input(s): VITAMINB12, FOLATE, FERRITIN, TIBC, IRON, RETICCTPCT in the last 72 hours. Urinalysis    Component Value Date/Time   COLORURINE YELLOW (A) 12/05/2019 2001   APPEARANCEUR HAZY (A) 12/05/2019 2001   APPEARANCEUR Clear 12/16/2012 0322   LABSPEC 1.011 12/05/2019 2001   LABSPEC 1.006 12/16/2012 0322   PHURINE 5.0 12/05/2019 2001   GLUCOSEU NEGATIVE 12/05/2019 2001   GLUCOSEU Negative 12/16/2012 0322   HGBUR NEGATIVE 12/05/2019 2001   BILIRUBINUR NEGATIVE 12/05/2019 2001   BILIRUBINUR Negative 12/16/2012 0322   KETONESUR 5 (A) 12/05/2019 2001   PROTEINUR NEGATIVE 12/05/2019 2001   UROBILINOGEN 0.2 03/15/2008 1651   NITRITE NEGATIVE 12/05/2019 2001   LEUKOCYTESUR NEGATIVE 12/05/2019 2001   LEUKOCYTESUR Trace 12/16/2012 0322         Time coordinating discharge: Over 45 minutes  SIGNED: Kendell BaneSeyed A Sallie Staron, MD, FACP, FHM. [facility],  Please use [email or web address] to Page If 7PM-7AM, please contact night-coverage [email or web address], Password [email or web address] 12/11/2019, 9:13 AM

## 2019-12-11 NOTE — TOC Transition Note (Signed)
Transition of Care [facility]) - CM/SW Discharge Note   Patient Details  Name: Nicholas Alvarez MRN: [id number] Date of Birth: [birth year]  Transition of [facility]) CM/SW Contact:  Margarito Liner, LCSW Phone Number: 12/11/2019, 10:59 AM   Clinical Narrative: Patient has orders to discharge home today. [facility] Care representative is aware. Start of care will be tomorrow. MD will enter [facility] order for PT, OT, RN. Patient has been notified that [facility] can accept him and he is agreeable. No further concerns. CSW signing off.  Final next level of care: Home w Home Health Services Barriers to Discharge: Barriers Resolved   Patient Goals and CMS Choice     Choice offered to / list presented to : Patient  Discharge Placement                    Patient and family notified of of transfer: 12/11/19  Discharge Plan and Services     Post Acute Care Choice: Home Health                    HH Arranged: RN, PT, OT [facility] Agency: [facility] Date [facility] Agency Contacted: 12/11/19   Representative spoke with at [facility]: Nelle Don  Social Determinants of Health (SDOH) Interventions     Readmission Risk Interventions No flowsheet data found.

## 2019-12-29 ENCOUNTER — Telehealth: Payer: Self-pay | Admitting: Urology

## 2019-12-29 NOTE — Telephone Encounter (Signed)
I have called both numbers and neither one of them work. I have made her an app but not sure how to get it to her.

## 2019-12-29 NOTE — Telephone Encounter (Signed)
Nicholas Alvarez needs an appointment for a voiding trial after having a Foley placed while he was in the hospital.

## 2020-01-02 ENCOUNTER — Other Ambulatory Visit
Admission: RE | Admit: 2020-01-02 | Discharge: 2020-01-02 | Disposition: A | Discharge status: Home / Self Care | Payer: Medicare Other | Source: Ambulatory Visit | Attending: Pulmonary Disease | Admitting: Pulmonary Disease

## 2020-01-02 DIAGNOSIS — R0602 Shortness of breath: Secondary | ICD-10-CM | POA: Diagnosis present

## 2020-01-02 LAB — FIBRIN DERIVATIVES D-DIMER (ARMC ONLY): Fibrin derivatives D-dimer (ARMC): 4648.28 ng/mL (FEU) — ABNORMAL HIGH (ref 0.00–499.00)

## 2020-01-02 NOTE — Progress Notes (Deleted)
01/03/2020 10:39 AM   Nicholas Alvarez 21-Sep-1940 [id number]  Referring provider: No referring provider defined for this encounter.  No chief complaint on file.   HPI: Nicholas Alvarez is a 79 y.o. male with BPH and urinary retention who presents today for a trial of void.  He was admitted to the hospital for multifocal pneumonia and was found to be in urinary retention.  I was able to place an 24 [place] coud catheter without issue and he was started on tamsulosin and finasteride.  Please see my consult note dated December 08, 2019 for more details.  ***   PMH: Past Medical History:  Diagnosis Date  . BPH (benign prostatic hyperplasia)   . 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 COVID-19   . Depression   . Dyslipidemia   . Ischemic cardiomyopathy    with left ventricular ejection fraction of 35%    Surgical History: Past Surgical History:  Procedure Laterality Date  . cabg      Home Medications:  Allergies as of 01/03/2020      Reactions   Ace Inhibitors Cough      Medication List       Accurate as of January 02, 2020 10:39 AM. If you have any questions, ask your nurse or doctor.        aspirin 325 MG EC tablet Take 1 tablet (325 mg total) by mouth daily.   clopidogrel 75 MG tablet Commonly known as: PLAVIX Take 75 mg by mouth daily.   DULoxetine 60 MG capsule Commonly known as: CYMBALTA Take 60 mg by mouth daily.   feeding supplement Liqd Take 237 mLs by mouth 2 (two) times daily between meals.   ferrous sulfate 325 (65 FE) MG tablet Take 1 tablet (325 mg total) by mouth 2 (two) times daily with a meal.   finasteride 5 MG tablet Commonly known as: PROSCAR Take 5 mg by mouth daily.   fluticasone 50 MCG/ACT nasal spray Commonly known as: FLONASE Place 1 spray into both nostrils 2 (two) times daily.   megestrol 40 MG tablet Commonly known as: MEGACE Take 40 mg by mouth  daily.   midodrine 5 MG tablet Commonly known as: PROAMATINE Take 1 tablet (5 mg total) by mouth 3 (three) times daily with meals.   montelukast 10 MG tablet Commonly known as: SINGULAIR Take 10 mg by mouth daily.   multivitamin with minerals Tabs tablet Take 1 tablet by mouth daily.   simvastatin 40 MG tablet Commonly known as: ZOCOR Take 40 mg by mouth at bedtime.   tamsulosin 0.4 MG Caps capsule Commonly known as: FLOMAX Take 0.4 mg by mouth daily.   Vitamin D3 25 MCG tablet Commonly known as: Vitamin D Take 1 tablet (1,000 Units total) by mouth daily.       Allergies:  Allergies  Allergen Reactions  . Ace Inhibitors Cough    Family History: Family History  Problem Relation Age of Onset  . Coronary artery disease Other   . Heart attack Sister 34  . Heart attack Maternal Grandfather 40  . Heart attack Paternal Grandfather 40  . COPD Mother   . Pneumonia Father   . Cancer Sister     Social History:  reports that he has quit smoking. He has never used smokeless tobacco. He reports current alcohol use. He reports that he does not use drugs.  ROS: Pertinent ROS in HPI  Physical Exam: There were no vitals taken for this visit.  Constitutional:  Well nourished. Alert and oriented, No acute distress. HEENT: [place] AT, moist mucus membranes.  Trachea midline, no masses. Cardiovascular: No clubbing, cyanosis, or edema. Respiratory: Normal respiratory effort, no increased work of breathing. GI: Abdomen is soft, non tender, non distended, no abdominal masses. Liver and spleen not palpable.  No hernias appreciated.  Stool sample for occult testing is not indicated.   GU: No CVA tenderness.  No bladder fullness or masses.  Patient with circumcised/uncircumcised phallus. ***Foreskin easily retracted***  Urethral meatus is patent.  No penile discharge. No penile lesions or rashes. Scrotum without lesions, cysts, rashes and/or edema.  Testicles are located scrotally  bilaterally. No masses are appreciated in the testicles. Left and right epididymis are normal. Rectal: Patient with  normal sphincter tone. Anus and perineum without scarring or rashes. No rectal masses are appreciated. Prostate is approximately *** grams, *** nodules are appreciated. Seminal vesicles are normal. Skin: No rashes, bruises or suspicious lesions. Lymph: No cervical or inguinal adenopathy. Neurologic: Grossly intact, no focal deficits, moving all 4 extremities. Psychiatric: Normal mood and affect.  Laboratory Data: Lab Results  Component Value Date   WBC 9.4 12/11/2019   HGB 9.0 (L) 12/11/2019   HCT 28.5 (L) 12/11/2019   MCV 84.1 12/11/2019   PLT 427 (H) 12/11/2019    Lab Results  Component Value Date   CREATININE 0.61 12/11/2019    Lab Results  Component Value Date   HGBA1C  03/07/2008    5.9 (NOTE)   The ADA recommends the following therapeutic goal for glycemic   control related to Hgb A1C measurement:   Goal of Therapy:   < 7.0% Hgb A1C   Reference: American Diabetes Association: Clinical Practice   Recommendations 2008, Diabetes Care,  2008, 31:(Suppl 1).    Lab Results  Component Value Date   TSH 2.21 10/25/2012       Component Value Date/Time   CHOL 128 10/25/2012 0421   HDL 36 (L) 10/25/2012 0421   CHOLHDL 7.5 03/08/2008 0435   VLDL 31 10/25/2012 0421   LDLCALC 61 10/25/2012 0421    Lab Results  Component Value Date   AST 14 (L) 12/11/2019   Lab Results  Component Value Date   ALT 9 12/11/2019    Urinalysis    Component Value Date/Time   COLORURINE YELLOW (A) 12/05/2019 2001   APPEARANCEUR HAZY (A) 12/05/2019 2001   APPEARANCEUR Clear 12/16/2012 0322   LABSPEC 1.011 12/05/2019 2001   LABSPEC 1.006 12/16/2012 0322   PHURINE 5.0 12/05/2019 2001   GLUCOSEU NEGATIVE 12/05/2019 2001   GLUCOSEU Negative 12/16/2012 0322   HGBUR NEGATIVE 12/05/2019 2001   BILIRUBINUR NEGATIVE 12/05/2019 2001   BILIRUBINUR Negative 12/16/2012 0322    KETONESUR 5 (A) 12/05/2019 2001   PROTEINUR NEGATIVE 12/05/2019 2001   UROBILINOGEN 0.2 03/15/2008 1651   NITRITE NEGATIVE 12/05/2019 2001   LEUKOCYTESUR NEGATIVE 12/05/2019 2001   LEUKOCYTESUR Trace 12/16/2012 0322    I have reviewed the labs.   Pertinent Imaging: @CT @ @ultrasound @ @KUB @ I have independently reviewed the films.    Assessment & Plan:  ***  1. BPH with urinary retention:     - foley catheter removed  -voiding trial today    -return if unable to urinate or experiencing suprapubic discomfort  -follow-up in one month for I PSS score, PVR and exam.   No follow-ups on file.  These notes generated with voice recognition software. I apologize  for typographical errors.  Zara Council, PA-C  [facility] [address]  [place], [place] [address] [phone]

## 2020-01-02 NOTE — Telephone Encounter (Signed)
It was a Research scientist (life sciences)

## 2020-01-02 NOTE — Telephone Encounter (Signed)
I found this phone number, [phone] (Mobile), in care everywhere.  Let us see if we can reach him by this number for his appointment tomorrow.

## 2020-01-03 ENCOUNTER — Other Ambulatory Visit: Payer: Self-pay

## 2020-01-03 ENCOUNTER — Ambulatory Visit: Payer: Medicare Other | Admitting: Urology

## 2020-01-03 ENCOUNTER — Other Ambulatory Visit: Payer: Self-pay | Admitting: Pulmonary Disease

## 2020-01-03 ENCOUNTER — Ambulatory Visit
Admission: RE | Admit: 2020-01-03 | Discharge: 2020-01-03 | Disposition: A | Discharge status: Home / Self Care | Payer: Medicare Other | Source: Ambulatory Visit | Attending: Pulmonary Disease | Admitting: Pulmonary Disease

## 2020-01-03 ENCOUNTER — Telehealth: Payer: Self-pay | Admitting: Pulmonary Disease

## 2020-01-03 ENCOUNTER — Other Ambulatory Visit (HOSPITAL_COMMUNITY): Payer: Self-pay | Admitting: Pulmonary Disease

## 2020-01-03 ENCOUNTER — Encounter: Payer: Self-pay | Admitting: Urology

## 2020-01-03 DIAGNOSIS — R0602 Shortness of breath: Secondary | ICD-10-CM | POA: Insufficient documentation

## 2020-01-03 DIAGNOSIS — R7989 Other specified abnormal findings of blood chemistry: Secondary | ICD-10-CM

## 2020-01-03 DIAGNOSIS — N401 Enlarged prostate with lower urinary tract symptoms: Secondary | ICD-10-CM

## 2020-01-03 DIAGNOSIS — J849 Interstitial pulmonary disease, unspecified: Secondary | ICD-10-CM

## 2020-01-03 MED ORDER — IOHEXOL 350 MG/ML SOLN
75.0000 mL | Freq: Once | INTRAVENOUS | Status: AC | PRN
  Administered 2020-01-03: 75 mL via INTRAVENOUS

## 2020-01-03 NOTE — Telephone Encounter (Signed)
Called by radiology tech at [facility] with results of outpatient Chest CTA:  1. No CT evidence of pulmonary embolism. 2. Multifocal pneumonia, possibly viral or atypical in etiology. Clinical correlation and follow-up to resolution recommended. 3. Aortic Atherosclerosis (ICD10-I70.0).  It appears that he was seen in the [facility] office on 01/02/2020 by Dr. Karna Christmas who noted that he had multifocal pneumonia at that time. Therefore, there are no new findings on this study. Defer further medical decision making to Dr. Karna Christmas.

## 2020-01-12 ENCOUNTER — Ambulatory Visit: Payer: Medicare Other

## 2020-02-06 ENCOUNTER — Telehealth: Payer: Self-pay | Admitting: Urology

## 2020-02-06 NOTE — Telephone Encounter (Signed)
Nicholas Alvarez was seen by [place] in the hospital for urinary retention and has not yet follow up for a voiding trial.  We have had a difficult time reaching him, but his wife seems to be reachable by the [facility] providers at [phone].  Would you please call her and have him come in for an appointment?  If the catheter is out, we need to do a PVR.

## 2020-02-06 NOTE — Telephone Encounter (Signed)
Patient states catheter was removed prior to discharge from the hospital. Appointment made for PVR with [facility].

## 2020-02-07 NOTE — Progress Notes (Signed)
02/08/2020 2:04 PM   Nicholas Alvarez 07-26-1940 [id number]  Referring provider: Kandyce Rud, MD [phone] [address] [facility] [place],  [place] [address]  Chief Complaint  Patient presents with  . Hospitalization Follow-up    HPI: Nicholas Alvarez is a 78 y.o. male with PMH of urinary retention and BPH who presents today for follow up.  Patient was found to have 500 cc in bladder during hospitalization nursing staff was unable to place catheter.  I placed an 20 [place] coud catheter without difficulty for the patient.    BPH WITH LUTS  (prostate and/or bladder) IPSS score: 5/3    PVR: 23     Major complaint(s):  Urgency after sitting for a few hours, urge incontinence and difficulty urinating at night (weak stream) and nocturia x 4 x a few months.  Denies any dysuria, hematuria or suprapubic pain.   Currently taking: tamsulosin 0.4 mg and finasteride 5 mg daily   Denies any recent fevers, chills, nausea or vomiting.    IPSS    Row Name 02/08/20 1300         International Prostate Symptom Score   How often have you had the sensation of not emptying your bladder? Not at All     How often have you had to urinate less than every two hours? Not at All     How often have you found you stopped and started again several times when you urinated? Not at All     How often have you found it difficult to postpone urination? Less than half the time     How often have you had a weak urinary stream? Less than half the time     How often have you had to strain to start urination? Less than 1 in 5 times     How many times did you typically get up at night to urinate? None     Total IPSS Score 5       Quality of Life due to urinary symptoms   If you were to spend the rest of your life with your urinary condition just the way it is now how would you feel about that? Mixed            Score:  1-7 Mild 8-19 Moderate 20-35 Severe   PMH: Past  Medical History:  Diagnosis Date  . BPH (benign prostatic hyperplasia)   . 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 COVID-19   . Depression   . Dyslipidemia   . Ischemic cardiomyopathy    with left ventricular ejection fraction of 35%    Surgical History: Past Surgical History:  Procedure Laterality Date  . cabg      Home Medications:  Allergies as of 02/08/2020      Reactions   Ace Inhibitors Cough      Medication List       Accurate as of February 08, 2020  2:04 PM. If you have any questions, ask your nurse or doctor.        STOP taking these medications   aspirin 325 MG EC tablet Stopped by: Michiel Cowboy, PA-C     TAKE these medications   clopidogrel 75 MG tablet Commonly known as: PLAVIX Take 75 mg by mouth daily.   DULoxetine 60 MG capsule Commonly known as: CYMBALTA Take 60 mg by mouth daily.  ferrous sulfate 325 (65 FE) MG tablet Take 1 tablet (325 mg total) by mouth 2 (two) times daily with a meal.   finasteride 5 MG tablet Commonly known as: PROSCAR Take 5 mg by mouth daily.   fluticasone 50 MCG/ACT nasal spray Commonly known as: FLONASE Place 1 spray into both nostrils 2 (two) times daily.   megestrol 40 MG tablet Commonly known as: MEGACE Take 40 mg by mouth daily.   montelukast 10 MG tablet Commonly known as: SINGULAIR Take 10 mg by mouth daily.   simvastatin 40 MG tablet Commonly known as: ZOCOR Take 40 mg by mouth at bedtime.   tamsulosin 0.4 MG Caps capsule Commonly known as: FLOMAX Take 0.4 mg by mouth daily.   triamcinolone 0.1 % Commonly known as: KENALOG Apply topically.       Allergies:  Allergies  Allergen Reactions  . Ace Inhibitors Cough    Family History: Family History  Problem Relation Age of Onset  . Coronary artery disease Other   . Heart attack Sister 67  . Heart attack Maternal Grandfather 40  . Heart attack Paternal Grandfather  40  . COPD Mother   . Pneumonia Father   . Cancer Sister     Social History:  reports that he has quit smoking. He has never used smokeless tobacco. He reports current alcohol use. He reports that he does not use drugs.  ROS: Pertinent ROS in HPI  Physical Exam: BP 133/82   Pulse (!) 106   Ht 5\' 10"  (1.778 m)   Wt 158 lb (71.7 kg)   BMI 22.67 kg/m   Constitutional:  Well nourished. Alert and oriented, No acute distress. HEENT: [place] AT, mask in place.  Trachea midline Cardiovascular: No clubbing, cyanosis, or edema. Respiratory: Normal respiratory effort, no increased work of breathing. Neurologic: Grossly intact, no focal deficits, moving all 4 extremities. Psychiatric: Normal mood and affect.  Laboratory Data: Lab Results  Component Value Date   WBC 9.4 12/11/2019   HGB 9.0 (L) 12/11/2019   HCT 28.5 (L) 12/11/2019   MCV 84.1 12/11/2019   PLT 427 (H) 12/11/2019    Lab Results  Component Value Date   CREATININE 0.61 12/11/2019    Lab Results  Component Value Date   HGBA1C  03/07/2008    5.9 (NOTE)   The ADA recommends the following therapeutic goal for glycemic   control related to Hgb A1C measurement:   Goal of Therapy:   < 7.0% Hgb A1C   Reference: American Diabetes Association: Clinical Practice   Recommendations 2008, Diabetes Care,  2008, 31:(Suppl 1).    Lab Results  Component Value Date   TSH 2.21 10/25/2012       Component Value Date/Time   CHOL 128 10/25/2012 0421   HDL 36 (L) 10/25/2012 0421   CHOLHDL 7.5 03/08/2008 0435   VLDL 31 10/25/2012 0421   LDLCALC 61 10/25/2012 0421    Lab Results  Component Value Date   AST 14 (L) 12/11/2019   Lab Results  Component Value Date   ALT 9 12/11/2019    Urinalysis    Component Value Date/Time   COLORURINE YELLOW (A) 12/05/2019 2001   APPEARANCEUR HAZY (A) 12/05/2019 2001   APPEARANCEUR Clear 12/16/2012 0322   LABSPEC 1.011 12/05/2019 2001   LABSPEC 1.006 12/16/2012 0322   PHURINE 5.0  12/05/2019 2001   GLUCOSEU NEGATIVE 12/05/2019 2001   GLUCOSEU Negative 12/16/2012 0322   HGBUR NEGATIVE 12/05/2019 2001   BILIRUBINUR NEGATIVE 12/05/2019 2001  BILIRUBINUR Negative 12/16/2012 0322   KETONESUR 5 (A) 12/05/2019 2001   PROTEINUR NEGATIVE 12/05/2019 2001   UROBILINOGEN 0.2 03/15/2008 1651   NITRITE NEGATIVE 12/05/2019 2001   LEUKOCYTESUR NEGATIVE 12/05/2019 2001   LEUKOCYTESUR Trace 12/16/2012 0322    I have reviewed the labs.   Pertinent Imaging: Results for [facility] "DAVID" (MRN [id number]) as of 02/08/2020 14:02  Ref. Range 02/08/2020 13:37  Scan Result Unknown 23    Assessment & Plan:    1. BPH with LUTS IPSS score is 5/3 Continue conservative management, avoiding bladder irritants and timed voiding's Most bothersome symptoms is/are weak stream at night Continue tamsulosin 0.4 mg daily and finasteride 5 mg daily We discussed undergoing evaluation for p.o. with a cystoscopic examination, but he defers at this time stating his symptoms are not that bothersome  RTC in 6 months for I PSS and PVR   Return in about 6 months (around 08/08/2020) for IPSS and PVR.  These notes generated with voice recognition software. I apologize for typographical errors.  Michiel Cowboy, PA-C  [facility] [address]  [address] [place], [place] [address] [phone]

## 2020-02-08 ENCOUNTER — Other Ambulatory Visit: Payer: Self-pay

## 2020-02-08 ENCOUNTER — Encounter: Payer: Self-pay | Admitting: Urology

## 2020-02-08 ENCOUNTER — Ambulatory Visit (INDEPENDENT_AMBULATORY_CARE_PROVIDER_SITE_OTHER): Payer: Medicare Other | Admitting: Urology

## 2020-02-08 VITALS — BP 133/82 | HR 106 | Ht 70.0 in | Wt 158.0 lb

## 2020-02-08 DIAGNOSIS — R338 Other retention of urine: Secondary | ICD-10-CM | POA: Diagnosis not present

## 2020-02-08 LAB — BLADDER SCAN AMB NON-IMAGING: Scan Result: 23

## 2020-08-08 ENCOUNTER — Ambulatory Visit: Payer: Self-pay | Admitting: Urology

## 2021-11-28 ENCOUNTER — Other Ambulatory Visit: Payer: Self-pay | Admitting: Pulmonary Disease

## 2021-11-28 DIAGNOSIS — J849 Interstitial pulmonary disease, unspecified: Secondary | ICD-10-CM

## 2021-12-03 ENCOUNTER — Ambulatory Visit
Admission: RE | Admit: 2021-12-03 | Discharge: 2021-12-03 | Disposition: A | Discharge status: Home / Self Care | Payer: Medicare Other | Source: Ambulatory Visit | Attending: Pulmonary Disease | Admitting: Pulmonary Disease

## 2021-12-03 DIAGNOSIS — J849 Interstitial pulmonary disease, unspecified: Secondary | ICD-10-CM

## 2021-12-16 ENCOUNTER — Inpatient Hospital Stay: Admission: RE | Admit: 2021-12-16 | Payer: Medicare Other | Source: Ambulatory Visit

## 2021-12-17 ENCOUNTER — Other Ambulatory Visit: Payer: Medicare Other

## 2021-12-22 ENCOUNTER — Inpatient Hospital Stay: Admission: RE | Admit: 2021-12-22 | Payer: Medicare Other | Source: Ambulatory Visit

## 2021-12-22 HISTORY — DX: Sjogren syndrome, unspecified: M35.00

## 2021-12-22 HISTORY — DX: Interstitial pulmonary disease, unspecified: J84.9

## 2021-12-22 HISTORY — DX: Essential (primary) hypertension: I10

## 2021-12-22 HISTORY — DX: Pneumonia, unspecified organism: J18.9

## 2021-12-23 ENCOUNTER — Inpatient Hospital Stay
Admission: RE | Admit: 2021-12-23 | Discharge: 2021-12-23 | Disposition: A | Discharge status: Home / Self Care | Payer: Medicare Other | Source: Ambulatory Visit

## 2021-12-23 NOTE — Patient Instructions (Addendum)
Your procedure is scheduled on: Monday, October 23 Report to the Registration Desk on the 1st floor of the [facility]. To find out your arrival time, please call [phone] between 1PM - 3PM on: Friday, October 20 If your arrival time is 6:00 am, do not arrive prior to that time as the [facility] entrance doors do not open until 6:00 am.  REMEMBER: Instructions that are not followed completely may result in serious medical risk, up to and including death; or upon the discretion of your surgeon and anesthesiologist your surgery may need to be rescheduled.  Do not eat food after midnight the night before surgery.  No gum chewing, lozengers or hard candies.  You may however, drink CLEAR liquids up to 2 hours before you are scheduled to arrive for your surgery. Do not drink anything within 2 hours of your scheduled arrival time.  Clear liquids include: - water  - apple juice without pulp - gatorade (not RED colors) - black coffee or tea (Do NOT add milk or creamers to the coffee or tea) Do NOT drink anything that is not on this list.  TAKE THESE MEDICATIONS THE MORNING OF SURGERY WITH A SIP OF WATER:  nebulizer   (take one the night before and one on the morning of surgery - helps to prevent nausea after surgery.)  Use inhalers on the day of surgery and bring to the hospital.  Follow recommendations from Cardiologist, Pulmonologist or PCP regarding stopping Aspirin, Coumadin, Plavix, Eliquis, Pradaxa, or Pletal.  One week prior to surgery: starting today, October 17 Stop Anti-inflammatories (NSAIDS) such as Advil, Aleve, Ibuprofen, Motrin, Naproxen, Naprosyn and Aspirin based products such as Excedrin, Goodys Powder, BC Powder. Stop ANY OVER THE COUNTER supplements until after surgery. You may however, continue to take Tylenol if needed for pain up until the day of surgery.  No Alcohol for 24 hours before or after surgery.  No Smoking including e-cigarettes for 24 hours  prior to surgery.  No chewable tobacco products for at least 6 hours prior to surgery.  No nicotine patches on the day of surgery.  Do not use any "recreational" drugs for at least a week prior to your surgery.  Please be advised that the combination of cocaine and anesthesia may have negative outcomes, up to and including death. If you test positive for cocaine, your surgery will be cancelled.  On the morning of surgery brush your teeth with toothpaste and water, you may rinse your mouth with mouthwash if you wish. Do not swallow any toothpaste or mouthwash.  Do not wear jewelry, make-up, hairpins, clips or nail polish.  Do not wear lotions, powders, or perfumes.   Contact lenses, hearing aids and dentures may not be worn into surgery.  Do not bring valuables to the hospital. [facility] is not responsible for any missing/lost belongings or valuables.   Bring your C-PAP to the hospital with you in case you may have to spend the night.   Notify your doctor if there is any change in your medical condition (cold, fever, infection).  Wear comfortable clothing (specific to your surgery type) to the hospital.  After surgery, you can help prevent lung complications by doing breathing exercises.  Take deep breaths and cough every 1-2 hours. Your doctor may order a device called an Incentive Spirometer to help you take deep breaths.  If you are being discharged the day of surgery, you will not be allowed to drive home. You will need a responsible  adult (18 years or older) to drive you home and stay with you that night.   If you are taking public transportation, you will need to have a responsible adult (18 years or older) with you. Please confirm with your physician that it is acceptable to use public transportation.   Please call the Pre-admissions Testing Dept. at [phone] if you have any questions about these instructions.  Surgery Visitation Policy:  Patients undergoing a  surgery or procedure may have two family members or support persons with them as long as the person is not COVID-19 positive or experiencing its symptoms.

## 2021-12-23 NOTE — Progress Notes (Signed)
Multiple calls and messages left in attempt to complete the pre-op interview on Monday, October 16 and Tuesday, October 17. Patient is expected to come to pre-admit testing on Friday, October 20 to get a covid test. If he shows for the covid test we will have labs, EKG and interview completed at that time. Dr. Teodoro Kil office made aware of the unsuccessful attempts that were made.

## 2021-12-26 ENCOUNTER — Encounter: Payer: Self-pay | Admitting: Urgent Care

## 2021-12-26 ENCOUNTER — Encounter (HOSPITAL_COMMUNITY): Payer: Self-pay | Admitting: Urgent Care

## 2021-12-26 ENCOUNTER — Encounter
Admission: RE | Admit: 2021-12-26 | Discharge: 2021-12-26 | Disposition: A | Discharge status: Home / Self Care | Payer: Medicare Other | Source: Ambulatory Visit | Attending: Pulmonary Disease | Admitting: Pulmonary Disease

## 2021-12-26 ENCOUNTER — Other Ambulatory Visit: Payer: Self-pay

## 2021-12-26 VITALS — BP 138/82 | HR 70 | Resp 14 | Ht 70.0 in | Wt 189.0 lb

## 2021-12-26 DIAGNOSIS — Z01818 Encounter for other preprocedural examination: Secondary | ICD-10-CM | POA: Diagnosis present

## 2021-12-26 DIAGNOSIS — Z01812 Encounter for preprocedural laboratory examination: Secondary | ICD-10-CM

## 2021-12-26 DIAGNOSIS — Z1152 Encounter for screening for COVID-19: Secondary | ICD-10-CM

## 2021-12-26 DIAGNOSIS — I10 Essential (primary) hypertension: Secondary | ICD-10-CM | POA: Insufficient documentation

## 2021-12-26 HISTORY — DX: Dyspnea, unspecified: R06.00

## 2021-12-26 LAB — BASIC METABOLIC PANEL
Anion gap: 6 (ref 5–15)
BUN: 12 mg/dL (ref 8–23)
CO2: 24 mmol/L (ref 22–32)
Calcium: 9 mg/dL (ref 8.9–10.3)
Chloride: 104 mmol/L (ref 98–111)
Creatinine, Ser: 0.9 mg/dL (ref 0.61–1.24)
GFR, Estimated: >60 mL/min (ref >60)
Glucose, Bld: 88 mg/dL (ref 70–99)
Potassium: 3.5 mmol/L (ref 3.5–5.1)
Sodium: 134 mmol/L — ABNORMAL LOW (ref 135–145)

## 2021-12-26 LAB — CBC
HCT: 38.6 % — ABNORMAL LOW (ref 39.0–52.0)
Hemoglobin: 12.1 g/dL — ABNORMAL LOW (ref 13.0–17.0)
MCH: 29.6 pg (ref 26.0–34.0)
MCHC: 31.3 g/dL (ref 30.0–36.0)
MCV: 94.4 fL (ref 80.0–100.0)
Platelets: 277 10*3/uL (ref 150–400)
RBC: 4.09 MIL/uL — ABNORMAL LOW (ref 4.22–5.81)
RDW: 13 % (ref 11.5–15.5)
WBC: 9.2 10*3/uL (ref 4.0–10.5)
nRBC: 0 % (ref 0.0–0.2)

## 2021-12-26 LAB — PROTIME-INR
INR: 1 (ref 0.8–1.2)
Prothrombin Time: 12.6 seconds (ref 11.4–15.2)

## 2021-12-26 LAB — APTT: aPTT: 32 seconds (ref 24–36)

## 2021-12-26 NOTE — Patient Instructions (Signed)
Your procedure is scheduled on: 12/29/21 Report to [facility]. To find out your arrival time please call [phone] between 1PM - 3PM on 12/26/21.  Remember: Instructions that are not followed completely may result in serious medical risk, up to and including death, or upon the discretion of your surgeon and anesthesiologist your surgery may need to be rescheduled.     _X__ 1. Do not eat food or drink any liquids after midnight the night before your procedure.                 No gum chewing or hard candies.   __X__2.  On the morning of surgery brush your teeth with toothpaste and water, you                 may rinse your mouth with mouthwash if you wish.  Do not swallow any              toothpaste of mouthwash.     _X__ 3.  No Alcohol for 24 hours before or after surgery.   _X__ 4.  Do Not Smoke or use e-cigarettes For 24 Hours Prior to Your Surgery.                 Do not use any chewable tobacco products for at least 6 hours prior to                 surgery.  ____  5.  Bring all medications with you on the day of surgery if instructed.   __X__  6.  Notify your doctor if there is any change in your medical condition      (cold, fever, infections).     Do not wear jewelry, make-up, hairpins, clips or nail polish. Do not wear lotions, powders, or perfumes. You may wear deodorant Do not shave body hair 48 hours prior to surgery. Men may shave face and neck. Do not bring valuables to the hospital.    [facility] is not responsible for any belongings or valuables.  Contacts, dentures/partials or body piercings may not be worn into surgery. Bring a case for your contacts, glasses or hearing aids, a denture cup will be supplied. Leave your suitcase in the car. After surgery it may be brought to your room. For patients admitted to the hospital, discharge time is determined by your treatment team.   Patients discharged the day of  surgery will not be allowed to drive home.    __X__ Take these medicines the morning of surgery with A SIP OF WATER:    1. Call back with your medications [phone]  2.   3.   4.  5.  6.  ____ Fleet Enema (as directed)   ____ Use CHG Soap/SAGE wipes as directed  __X__ Use inhalers on the day of surgery USE YOUR NEBULIZER MORNING OF YOUR PROCEDURE  ____ Stop metformin/Janumet/Farxiga 2 days prior to surgery    ____ Take 1/2 of usual insulin dose the night before surgery. No insulin the morning          of surgery.   ____ Stop Blood Thinners Coumadin/Plavix/Xarelto/Pleta/Pradaxa/Eliquis/Effient/Aspirin  on   Or contact your Surgeon, Cardiologist or Medical Doctor regarding  ability to stop your blood thinners  __X__ Stop Anti-inflammatories 7 days before surgery such as Advil, Ibuprofen, Motrin,  BC or Goodies Powder, Naprosyn, Naproxen, Aleve, Aspirin    __X__ Stop all herbal supplements, fish oil or  vitamin E until after surgery.    ____ Bring C-Pap to the hospital.    CALL ME BACK WITH MEDICATIONS [phone] ASK FOR Tram Wrenn

## 2021-12-26 NOTE — Pre-Procedure Instructions (Addendum)
Patient arrived for labs stated he has not been told to stop his plavix. Phone call to MD office RN called back pt able to continue with procedure as scheduled to hold plavix from here forward.  12/26/21 1407 Pt called back and we reviewed medication list. Pt wife by side to document medications pt has been instructed to take the morning of his procedure. Both pt and wife verbalized understanding.

## 2021-12-27 LAB — SARS CORONAVIRUS 2 (TAT 6-24 HRS): SARS Coronavirus 2: NEGATIVE

## 2021-12-29 ENCOUNTER — Encounter: Admission: RE | Payer: Self-pay | Source: Home / Self Care

## 2021-12-29 ENCOUNTER — Ambulatory Visit: Admission: RE | Admit: 2021-12-29 | Payer: Medicare Other | Source: Home / Self Care

## 2021-12-29 DIAGNOSIS — I10 Essential (primary) hypertension: Secondary | ICD-10-CM

## 2021-12-29 DIAGNOSIS — Z01812 Encounter for preprocedural laboratory examination: Secondary | ICD-10-CM

## 2021-12-29 SURGERY — BRONCHOSCOPY, FLEXIBLE
Anesthesia: General

## 2022-01-12 ENCOUNTER — Encounter
Admission: RE | Admit: 2022-01-12 | Discharge: 2022-01-12 | Disposition: A | Discharge status: Home / Self Care | Payer: Medicare Other | Source: Ambulatory Visit | Attending: Pulmonary Disease | Admitting: Pulmonary Disease

## 2022-01-12 NOTE — Pre-Procedure Instructions (Signed)
Call to patient to go over surgical instructions again including covid testing date and verification of medication. Surgery was rescheduled from 12/29/21 due to doctor being stuck out of town. Stated that he took the Plavix today 01/12/2022. Instructed to not take plavix again until after his 01/16/22 surgery according to Dr. Teodoro Kil instruction of when it is safe to start taking again. Acknowledged understanding.

## 2022-01-14 ENCOUNTER — Encounter
Admission: RE | Admit: 2022-01-14 | Discharge: 2022-01-14 | Disposition: A | Discharge status: Home / Self Care | Payer: Medicare Other | Source: Ambulatory Visit | Attending: Pulmonary Disease | Admitting: Pulmonary Disease

## 2022-01-14 DIAGNOSIS — Z1152 Encounter for screening for COVID-19: Secondary | ICD-10-CM | POA: Insufficient documentation

## 2022-01-14 DIAGNOSIS — Z01812 Encounter for preprocedural laboratory examination: Secondary | ICD-10-CM | POA: Insufficient documentation

## 2022-01-14 NOTE — Patient Instructions (Addendum)
Your procedure is scheduled on: Friday, November 10 Report to the Registration Desk on the 1st floor of the [facility] To find out your arrival time, please call [phone] between 1PM - 3PM on: Thursday, November 9 If your arrival time is 6:00 am, do not arrive prior to that time as the [facility] entrance doors do not open until 6:00 am.  REMEMBER: Instructions that are not followed completely may result in serious medical risk, up to and including death; or upon the discretion of your surgeon and anesthesiologist your surgery may need to be rescheduled.  Do not eat food after midnight the night before surgery.  No gum chewing, lozengers or hard candies.  You may however, drink CLEAR liquids up to 2 hours before you are scheduled to arrive for your surgery. Do not drink anything within 2 hours of your scheduled arrival time.  Clear liquids include: - water  - apple juice without pulp - gatorade (not RED colors) - black coffee or tea (Do NOT add milk or creamers to the coffee or tea) Do NOT drink anything that is not on this list.  TAKE THESE MEDICATIONS THE MORNING OF SURGERY WITH A SIP OF WATER:  Budesonide (pulmicort) nebulizer Carvedilol Duloxetine Simvastatin Tamsulosin Mycophenoolate (cellcept)  Plavix (clopidogrel) - last day taken was November 6. Do not take anymore until AFTER surgery per surgeon instruction.  One week prior to surgery: Stop Anti-inflammatories (NSAIDS) such as Advil, Aleve, Ibuprofen, Motrin, Naproxen, Naprosyn and Aspirin based products such as Excedrin, Goodys Powder, BC Powder. Stop ANY OVER THE COUNTER supplements until after surgery. You may however, continue to take Tylenol if needed for pain up until the day of surgery.  No Alcohol for 24 hours before or after surgery.  No Smoking including e-cigarettes for 24 hours prior to surgery.  No chewable tobacco products for at least 6 hours prior to surgery.  No nicotine patches on the day  of surgery.  Do not use any "recreational" drugs for at least a week prior to your surgery.  Please be advised that the combination of cocaine and anesthesia may have negative outcomes, up to and including death. If you test positive for cocaine, your surgery will be cancelled.  On the morning of surgery brush your teeth with toothpaste and water, you may rinse your mouth with mouthwash if you wish. Do not swallow any toothpaste or mouthwash.  Do not wear jewelry, make-up, hairpins, clips or nail polish.  Do not wear lotions, powders, or perfumes.   Contact lenses, hearing aids and dentures may not be worn into surgery.  Do not bring valuables to the hospital. [facility] is not responsible for any missing/lost belongings or valuables.   Notify your doctor if there is any change in your medical condition (cold, fever, infection).  Wear comfortable clothing (specific to your surgery type) to the hospital.  If you are being discharged the day of surgery, you will not be allowed to drive home. You will need a responsible adult (18 years or older) to drive you home and stay with you that night.   If you are taking public transportation, you will need to have a responsible adult (18 years or older) with you. Please confirm with your physician that it is acceptable to use public transportation.   Please call the Pre-admissions Testing Dept. at [phone] if you have any questions about these instructions.  Surgery Visitation Policy:  Patients undergoing a surgery or procedure may have two family  members or support persons with them as long as the person is not COVID-19 positive or experiencing its symptoms.

## 2022-01-15 LAB — SARS CORONAVIRUS 2 (TAT 6-24 HRS): SARS Coronavirus 2: NEGATIVE

## 2022-01-15 MED ORDER — LACTATED RINGERS IV SOLN: INTRAVENOUS | Status: DC

## 2022-01-15 MED ORDER — CHLORHEXIDINE GLUCONATE 0.12 % MT SOLN: 15.0000 mL | Freq: Once | OROMUCOSAL | Status: AC

## 2022-01-15 MED ORDER — ORAL CARE MOUTH RINSE: 15.0000 mL | Freq: Once | OROMUCOSAL | Status: AC

## 2022-01-15 MED ORDER — FAMOTIDINE 20 MG PO TABS: 20.0000 mg | ORAL_TABLET | Freq: Once | ORAL | Status: AC

## 2022-01-16 ENCOUNTER — Other Ambulatory Visit: Payer: Self-pay

## 2022-01-16 ENCOUNTER — Ambulatory Visit: Payer: Medicare Other | Admitting: Registered Nurse

## 2022-01-16 ENCOUNTER — Ambulatory Visit
Admission: RE | Admit: 2022-01-16 | Discharge: 2022-01-16 | Disposition: A | Discharge status: Home / Self Care | Payer: Medicare Other | Attending: Pulmonary Disease | Admitting: Pulmonary Disease

## 2022-01-16 ENCOUNTER — Encounter
Admission: RE | Disposition: A | Discharge status: Home / Self Care | Payer: Self-pay | Source: Home / Self Care | Attending: Pulmonary Disease

## 2022-01-16 DIAGNOSIS — Z951 Presence of aortocoronary bypass graft: Secondary | ICD-10-CM | POA: Insufficient documentation

## 2022-01-16 DIAGNOSIS — E785 Hyperlipidemia, unspecified: Secondary | ICD-10-CM | POA: Diagnosis not present

## 2022-01-16 DIAGNOSIS — J189 Pneumonia, unspecified organism: Secondary | ICD-10-CM | POA: Insufficient documentation

## 2022-01-16 DIAGNOSIS — I2510 Atherosclerotic heart disease of native coronary artery without angina pectoris: Secondary | ICD-10-CM | POA: Insufficient documentation

## 2022-01-16 DIAGNOSIS — I10 Essential (primary) hypertension: Secondary | ICD-10-CM | POA: Diagnosis not present

## 2022-01-16 DIAGNOSIS — Z87891 Personal history of nicotine dependence: Secondary | ICD-10-CM | POA: Diagnosis not present

## 2022-01-16 DIAGNOSIS — F32A Depression, unspecified: Secondary | ICD-10-CM | POA: Diagnosis not present

## 2022-01-16 DIAGNOSIS — I252 Old myocardial infarction: Secondary | ICD-10-CM | POA: Insufficient documentation

## 2022-01-16 DIAGNOSIS — M3500 Sicca syndrome, unspecified: Secondary | ICD-10-CM | POA: Diagnosis not present

## 2022-01-16 HISTORY — PX: BRONCHIAL WASHINGS: SHX5105

## 2022-01-16 HISTORY — PX: RIGID BRONCHOSCOPY: SHX5069

## 2022-01-16 SURGERY — IRRIGATION, BRONCHUS
Anesthesia: General

## 2022-01-16 MED ORDER — DEXAMETHASONE SODIUM PHOSPHATE 10 MG/ML IJ SOLN
INTRAMUSCULAR | Status: AC
  Filled 2022-01-16: qty 1

## 2022-01-16 MED ORDER — SUGAMMADEX SODIUM 200 MG/2ML IV SOLN
INTRAVENOUS | Status: DC | PRN
  Administered 2022-01-16: 200 mg via INTRAVENOUS

## 2022-01-16 MED ORDER — DEXAMETHASONE SODIUM PHOSPHATE 10 MG/ML IJ SOLN
INTRAMUSCULAR | Status: DC | PRN
  Administered 2022-01-16: 10 mg via INTRAVENOUS

## 2022-01-16 MED ORDER — IPRATROPIUM-ALBUTEROL 0.5-2.5 (3) MG/3ML IN SOLN: 3.0000 mL | RESPIRATORY_TRACT | Status: DC

## 2022-01-16 MED ORDER — LIDOCAINE HCL (CARDIAC) PF 100 MG/5ML IV SOSY
PREFILLED_SYRINGE | INTRAVENOUS | Status: DC | PRN
  Administered 2022-01-16: 80 mg via INTRAVENOUS

## 2022-01-16 MED ORDER — LIDOCAINE HCL (PF) 1 % IJ SOLN: 30.0000 mL | Freq: Once | INTRAMUSCULAR | Status: DC

## 2022-01-16 MED ORDER — GUAIFENESIN 100 MG/5ML PO LIQD
10.0000 mL | ORAL | Status: DC | PRN
  Administered 2022-01-16: 10 mL via ORAL
  Filled 2022-01-16 (×2): qty 10

## 2022-01-16 MED ORDER — FENTANYL CITRATE (PF) 100 MCG/2ML IJ SOLN
INTRAMUSCULAR | Status: AC
  Administered 2022-01-16: 50 ug via INTRAVENOUS
  Filled 2022-01-16: qty 2

## 2022-01-16 MED ORDER — ROCURONIUM BROMIDE 100 MG/10ML IV SOLN
INTRAVENOUS | Status: DC | PRN
  Administered 2022-01-16: 60 mg via INTRAVENOUS

## 2022-01-16 MED ORDER — OXYCODONE HCL 5 MG/5ML PO SOLN: 5.0000 mg | Freq: Once | ORAL | Status: DC | PRN

## 2022-01-16 MED ORDER — PROPOFOL 10 MG/ML IV BOLUS
INTRAVENOUS | Status: DC | PRN
  Administered 2022-01-16: 150 mg via INTRAVENOUS

## 2022-01-16 MED ORDER — BUTAMBEN-TETRACAINE-BENZOCAINE 2-2-14 % EX AERO: 1.0000 | INHALATION_SPRAY | Freq: Once | CUTANEOUS | Status: DC

## 2022-01-16 MED ORDER — IPRATROPIUM-ALBUTEROL 0.5-2.5 (3) MG/3ML IN SOLN
RESPIRATORY_TRACT | Status: AC
  Administered 2022-01-16: 3 mL via RESPIRATORY_TRACT
  Filled 2022-01-16: qty 3

## 2022-01-16 MED ORDER — GUAIFENESIN 100 MG/5ML PO LIQD
5.0000 mL | ORAL | Status: DC | PRN
  Filled 2022-01-16: qty 5

## 2022-01-16 MED ORDER — CHLORHEXIDINE GLUCONATE 0.12 % MT SOLN
OROMUCOSAL | Status: AC
  Administered 2022-01-16: 15 mL via OROMUCOSAL
  Filled 2022-01-16: qty 15

## 2022-01-16 MED ORDER — LIDOCAINE HCL URETHRAL/MUCOSAL 2 % EX GEL
1.0000 | Freq: Once | CUTANEOUS | Status: DC
  Filled 2022-01-16: qty 5

## 2022-01-16 MED ORDER — FENTANYL CITRATE (PF) 100 MCG/2ML IJ SOLN
25.0000 ug | INTRAMUSCULAR | Status: DC | PRN
  Administered 2022-01-16: 50 ug via INTRAVENOUS

## 2022-01-16 MED ORDER — FENTANYL CITRATE (PF) 100 MCG/2ML IJ SOLN
INTRAMUSCULAR | Status: AC
  Filled 2022-01-16: qty 2

## 2022-01-16 MED ORDER — ONDANSETRON HCL 4 MG/2ML IJ SOLN
INTRAMUSCULAR | Status: DC | PRN
  Administered 2022-01-16: 4 mg via INTRAVENOUS

## 2022-01-16 MED ORDER — ROCURONIUM BROMIDE 10 MG/ML (PF) SYRINGE
PREFILLED_SYRINGE | INTRAVENOUS | Status: AC
  Filled 2022-01-16: qty 10

## 2022-01-16 MED ORDER — FENTANYL CITRATE (PF) 100 MCG/2ML IJ SOLN
INTRAMUSCULAR | Status: DC | PRN
  Administered 2022-01-16: 50 ug via INTRAVENOUS

## 2022-01-16 MED ORDER — OXYCODONE HCL 5 MG PO TABS: 5.0000 mg | ORAL_TABLET | Freq: Once | ORAL | Status: DC | PRN

## 2022-01-16 MED ORDER — PHENYLEPHRINE HCL 0.25 % NA SOLN: 1.0000 | Freq: Four times a day (QID) | NASAL | Status: DC | PRN

## 2022-01-16 MED ORDER — FAMOTIDINE 20 MG PO TABS
ORAL_TABLET | ORAL | Status: AC
  Administered 2022-01-16: 20 mg via ORAL
  Filled 2022-01-16: qty 1

## 2022-01-16 MED ORDER — PROPOFOL 10 MG/ML IV BOLUS
INTRAVENOUS | Status: AC
  Filled 2022-01-16: qty 20

## 2022-01-16 MED ORDER — ONDANSETRON HCL 4 MG/2ML IJ SOLN
INTRAMUSCULAR | Status: AC
  Filled 2022-01-16: qty 2

## 2022-01-16 NOTE — Anesthesia Preprocedure Evaluation (Signed)
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), pneumonia, former smoker   Pulmonary exam normal        Cardiovascular hypertension, + CAD, + Past MI and + CABG  Normal cardiovascular exam     Neuro/Psych  PSYCHIATRIC DISORDERS  Depression    negative neurological ROS     GI/Hepatic negative GI ROS, Neg liver ROS,,,  Endo/Other  negative endocrine ROS    Renal/GU      Musculoskeletal   Abdominal   Peds  Hematology negative hematology ROS (+)   Anesthesia Other Findings Past Medical History: No date: BPH (benign prostatic hyperplasia) No date: CAD (coronary artery disease)     Comment:  Post anterior wall myocardial infarction and 5-vessel               coronary bypass  No date: COVID-19 No date: Depression No date: Dyslipidemia No date: Dyspnea No date: Hypertension No date: Interstitial lung disease (HCC) No date: Ischemic cardiomyopathy     Comment:  with left ventricular ejection fraction of 35% 2010: Myocardial infarction (HCC) No date: Pneumonia No date: Sjogren's syndrome [facility])  Past Surgical History: 03/2008: CARDIAC CATHETERIZATION No date: COLONOSCOPY 03/12/2008: CORONARY ARTERY BYPASS GRAFT 11/2009: PROSTATE BIOPSY     Reproductive/Obstetrics negative OB ROS                             Anesthesia Physical Anesthesia Plan  ASA: 3  Anesthesia Plan: General ETT   Post-op Pain Management: Minimal or no pain anticipated   Induction: Intravenous  PONV Risk Score and Plan: 2 and Ondansetron,  Dexamethasone and Treatment may vary due to age or medical condition  Airway Management Planned: Oral ETT  Additional Equipment:   Intra-op Plan:   Post-operative Plan: Extubation in OR  Informed Consent: I have reviewed the patients History and Physical, chart, labs and discussed the procedure including the risks, benefits and alternatives for the proposed anesthesia with the patient or authorized representative who has indicated his/her understanding and acceptance.     Dental Advisory Given  Plan Discussed with: Anesthesiologist, CRNA and Surgeon  Anesthesia Plan Comments: (Patient consented for risks of anesthesia including but not limited to:  - adverse reactions to medications - damage to eyes, teeth, lips or other oral mucosa - nerve damage due to positioning  - sore throat or hoarseness - Damage to heart, brain, nerves, lungs, other parts of body or loss of life  Patient voiced understanding.)       Anesthesia Quick Evaluation

## 2022-01-16 NOTE — H&P (Signed)
PULMONOLOGY         Date: 01/16/2022,   MRN# [id number] Nicholas Alvarez 1940-08-02     AdmissionWeight: 85.7 kg                 CurrentWeight: 85.7 kg    CHIEF COMPLAINT:   Bilateral recurrent pneumonia with failure of outpatient therapy    HISTORY OF PRESENT ILLNESS   Nicholas Alvarez is 81 y.o. male who was referred to [facility] Pulmonary clinic due to recurrent dyspnea. He has been sick for almost a year now with recurrent pneumonia.  He had CXR done was found to have infiltrates with mucus plugging of airways bilaterally. Nicholas Alvarez He was placed on antibiotic therapy during this time with transient improvement followed by recurrence.  His wife accompanies him today and shares he was treated 5 months ago then had recurrence of symptoms and then had another course of antibiotics. Patient again completed outpatient therapy for community-acquired pneumonia 3 months ago with Levaquin and he was also started on midodrine due to transient hypotension at that time. He was hospitalized for 6 days at [facility].  He reports he had pnemonia as an adult few times including as a young man in [employer]. He shares that his own father passed away with complications of pneumonia. Wife states he was in NH and had progressive respiratory issues for some time. COVID19 testing was negative and patient did have [place] x2 vaccination. He lost appx 100lbs in past 1 year. 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 Due to severely elevated D-dimer we opted to rule out acute venous pulmonary thromboembolism with CT PE protocol. This study was negative for PE however did show diffuse bilateral pulmonary nodularity as well as bronchiectatic changes throughout bilaterally as well as large cystic lesion in the left upper lobe which can sometimes be associated with Sjogren's disease. In conjunction with abnormal autoimmune  serology this is most likely due to underlying autoimmune disease such as scleroderma and will need to have rheumatologic evaluation. He has not had leukocytosis throughout his which points away from infectious etiology, and does have anemia of chronic disease which is normocytic with hemoglobin of 7.8 in September 2021 which is also likely due to chronic autoimmune disease and is contributing to his breathlessness. Patient is here for airway inspection via bronchoscopy with plan for therapeutic aspiration of tracheobronchial tree and BAL.     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[place])    Ischemic cardiomyopathy    with left ventricular ejection fraction of 35%   Myocardial infarction ([place]) 2010   Pneumonia    Sjogren's syndrome [facility])      SURGICAL HISTORY   Past Surgical History:  Procedure Laterality Date   CARDIAC CATHETERIZATION  03/2008   COLONOSCOPY     CORONARY ARTERY BYPASS GRAFT  03/12/2008   PROSTATE BIOPSY  11/2009     FAMILY HISTORY   Family History  Problem Relation Age of Onset   Coronary artery disease Other    Heart attack Sister 8   Heart attack  Maternal Grandfather 67   Heart attack Paternal Grandfather 46   COPD Mother    Pneumonia Father    Cancer Sister      SOCIAL HISTORY   Social History   Tobacco Use   Smoking status: Former   Smokeless tobacco: Never   Tobacco comments:    Quit around age 24  Vaping Use   Vaping Use: Never used  Substance Use Topics   Alcohol use: Yes    Alcohol/week: 12.0 standard drinks of alcohol    Types: 12 Shots of liquor per week   Drug use: No     MEDICATIONS    Home Medication:    Current Medication:  Current Facility-Administered  Medications:    butamben-tetracaine-benzocaine (CETACAINE) spray 1 spray, 1 spray, Topical, Once, Aleskerov, Fuad, MD   chlorhexidine (PERIDEX) 0.12 % solution 15 mL, 15 mL, Mouth/Throat, Once **OR** Oral care mouth rinse, 15 mL, Mouth Rinse, Once, Darrin Nipper, MD   chlorhexidine (PERIDEX) 0.12 % solution, , , ,    famotidine (PEPCID) 20 MG tablet, , , ,    famotidine (PEPCID) tablet 20 mg, 20 mg, Oral, Once, Karen Kitchens, NP   lactated ringers infusion, , Intravenous, Continuous, Darrin Nipper, MD   lidocaine (PF) (XYLOCAINE) 1 % injection 30 mL, 30 mL, Infiltration, Once, Aleskerov, Fuad, MD   lidocaine (XYLOCAINE) 2 % jelly 1 Application, 1 Application, Topical, Once, Aleskerov, Fuad, MD   phenylephrine (NEO-SYNEPHRINE) 0.25 % nasal spray 1 spray, 1 spray, Each Nare, Q6H PRN, Ottie Glazier, MD    ALLERGIES   Ace inhibitors     REVIEW OF SYSTEMS    Review of Systems:  Gen:  Denies  fever, sweats, chills weigh loss  HEENT: Denies blurred vision, double vision, ear pain, eye pain, hearing loss, nose bleeds, sore throat Cardiac:  No dizziness, chest pain or heaviness, chest tightness,edema Resp:   reports dyspnea chronically  Gi: Denies swallowing difficulty, stomach pain, nausea or vomiting, diarrhea, constipation, bowel incontinence Gu:  Denies bladder incontinence, burning urine Ext:   Denies Joint pain, stiffness or swelling Skin: Denies  skin rash, easy bruising or bleeding or hives Endoc:  Denies polyuria, polydipsia , polyphagia or weight change Psych:   Denies depression, insomnia or hallucinations   Other:  All other systems negative   VS: BP 131/79   Pulse 67   Temp (!) 97.2 F (36.2 C) (Temporal)   Resp 16   Ht 5' 10" (1.778 m)   Wt 85.7 kg   SpO2 96%   BMI 27.12 kg/m      PHYSICAL EXAM    GENERAL:NAD, no fevers, chills, no weakness no fatigue HEAD: Normocephalic, atraumatic.  EYES: Pupils equal, round, reactive to light. Extraocular muscles  intact. No scleral icterus.  MOUTH: Moist mucosal membrane. Dentition intact. No abscess noted.  EAR, NOSE, THROAT: Clear without exudates. No external lesions.  NECK: Supple. No thyromegaly. No nodules. No JVD.  PULMONARY: decreased breath sounds with mild rhonchi worse at bases bilaterally.  CARDIOVASCULAR: S1 and S2. Regular rate and rhythm. No murmurs, rubs, or gallops. No edema. Pedal pulses 2+ bilaterally.  GASTROINTESTINAL: Soft, nontender, nondistended. No masses. Positive bowel sounds. No hepatosplenomegaly.  MUSCULOSKELETAL: No swelling, clubbing, or edema. Range of motion full in all extremities.  NEUROLOGIC: Cranial nerves II through XII are intact. No gross focal neurological deficits. Sensation intact. Reflexes intact.  SKIN: No ulceration, lesions, rashes, or cyanosis. Skin warm and dry. Turgor intact.  PSYCHIATRIC: Mood, affect within normal limits. The  patient is awake, alert and oriented x 3. Insight, judgment intact.       IMAGING   Mucus plugging of bilateral airways on CT chest - 12/03/21  Narrative & Impression  CLINICAL DATA:  Interstitial lung disease, cough   EXAM: CT CHEST WITHOUT CONTRAST   TECHNIQUE: Multidetector CT imaging of the chest was performed following the standard protocol without IV contrast.   RADIATION DOSE REDUCTION: This exam was performed according to the departmental dose-optimization program which includes automated exposure control, adjustment of the mA and/or kV according to patient size and/or use of iterative reconstruction technique.   COMPARISON:  01/03/2020   FINDINGS: Cardiovascular: Coronary artery bypass grafting has been performed. Global cardiac size within normal limits. Left apical aneurysm again noted, better seen on prior contrast enhanced examination. No pericardial effusion. Central pulmonary arteries are enlarged in keeping with changes of pulmonary arterial hypertension. Moderate atherosclerotic calcification  within the thoracic aorta. No aortic aneurysm.   Mediastinum/Nodes: No enlarged mediastinal or axillary lymph nodes. Thyroid gland, trachea, and esophagus demonstrate no significant findings.   Lungs/Pleura: Diffuse cylindrical bronchiectasis is again identified, with increasing bronchial wall thickening in keeping with progressive airway inflammation. There is extensive scattered airway impaction. There is peribronchial nodularity again identified with previously noted areas of consolidation now demonstrating partial resolution and cavitation, best appreciated within the left upper lobe. Altogether, the findings are suggestive of a chronic infectious process such as atypical mycobacterial or fungal infection. Pulmonary tuberculosis is considered less likely, but could also appear in this fashion and should be excluded. No pneumothorax or pleural effusion. No central obstructing lesion.   Upper Abdomen: No acute abnormality.   Musculoskeletal: No chest wall mass or suspicious bone lesions identified.   IMPRESSION: 1. Diffuse cylindrical bronchiectasis with increasing bronchial wall thickening in keeping with progressive airway inflammation. Extensive scattered airway impaction and peribronchial nodularity with areas of consolidation now demonstrating partial resolution and cavitation, best appreciated within the left upper lobe. Altogether, the findings are suggestive of a chronic infectious process such as atypical mycobacterial or fungal infection. Pulmonary tuberculosis is considered less likely, but could also appear in this fashion and should be excluded. 2. 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 3. Morphologic changes in keeping with pulmonary arterial hypertension.     Electronically Signed   By: Fidela Salisbury M.D.   On: 12/05/2021 01:34      ASSESSMENT/PLAN       Recurrent pneumonia with mucus plugging        - background of bronchiectasis -with Sjogrens disease      - patient immunocompromised with risk for endemic and opportunistic infections      - plan for bronchoscopy with therapeutic aspiration of tracheobronchial tree      - AFB smear and culture with sensetivities      - fungal cultures, KOH pres      - gram stain and culture aerobic and anareobic      - BAL with cell count and diff      - PCR PJP   -Reviewed risks/complications and benefits with patient, risks include infection, pneumothorax/pneumomediastinum which may require chest tube placement as well as overnight/prolonged hospitalization and possible mechanical ventilation. Other risks include bleeding and very rarely death.  Patient understands risks and wishes to proceed.  Additional questions were answered, and patient is aware that post procedure patient will be going home with family and may experience cough with possible clots on expectoration as well  as phlegm which may last few days as well as hoarseness of voice post intubation and mechanical ventilation.      Thank you for allowing me to participate in the care of this patient.  Patient/Family are satisfied with care plan and all questions have been answered.    Provider disclosure: Patient with at least one acute or chronic illness or injury that poses a threat to life or bodily function and is being managed actively during this encounter.  All of the below services have been performed independently by signing provider:  review of prior documentation from internal and or external health records.  Review of previous and current lab results.  Interview and comprehensive assessment during patient visit today. Review of current and previous chest radiographs/CT scans. Discussion of management and test interpretation with health care team and patient/family.   This document was prepared using Dragon voice recognition software and may include unintentional dictation errors.      Ottie Glazier, M.D.  Division of Pulmonary & Critical Care Medicine

## 2022-01-16 NOTE — Anesthesia Procedure Notes (Signed)
Procedure Name: Intubation Date/Time: 01/16/2022 12:52 PM  Performed by: Omer Jack, CRNAPre-anesthesia Checklist: Patient identified, Patient being monitored, Timeout performed, Emergency Drugs available and Suction available Patient Re-evaluated:Patient Re-evaluated prior to induction Oxygen Delivery Method: Circle system utilized Preoxygenation: Pre-oxygenation with 100% oxygen Induction Type: IV induction Ventilation: Mask ventilation without difficulty Laryngoscope Size: 4 and McGraph Grade View: Grade I Tube type: Oral Tube size: 8.5 mm Number of attempts: 1 Airway Equipment and Method: Stylet Placement Confirmation: ETT inserted through vocal cords under direct vision, positive ETCO2 and breath sounds checked- equal and bilateral Secured at: 21 cm Tube secured with: Tape Dental Injury: Teeth and Oropharynx as per pre-operative assessment

## 2022-01-16 NOTE — Procedures (Signed)
FLEXIBLE BRONCHOSCOPY WITH THERAPEUTIC ASPIRATION OF TRACHEOBRONCHIAL TREE AND BAL PROCEDURE NOTE    Flexible bronchoscopy was performed on 01/16/22  by : Amyria Komar MD  assistance by : Elenora Gamma RT  and 2)[facility] staff   Indication for the procedure was :  Pre-procedural H&P. The following assessment was performed on the day of the procedure prior to initiating sedation History:  Chest pain none  Dyspnea - chronic Hemoptysis - none  Cough - chronic  Fever -none  Other pertinent items -chronic immunosuppresion   Examination Vital signs -reviewed as per nursing documentation today Cardiac    Murmurs: -none   Rubs : -none  Gallop: -none  Lungs Wheezing: -yes  Rales : -yes  Rhonchi :-yes   Other pertinent findings: none    Pre-procedural assessment for Procedural Sedation included: Depth of sedation: As per anesthesia team  ASA Classification:  3 Mallampati airway assessment: 2    Medication list reviewed: none   The patient's interval history was taken and revealed: no new complaints The pre- procedure physical examination revealed: No new findings Refer to prior clinic note for details.  Informed Consent: Informed consent was obtained from:  patient after explanation of procedure and risks, benefits, as well as alternative procedures available.  Explanation of level of sedation and possible transfusion was also provided.    Procedural Preparation: Time out was performed and patient was identified by name and birthdate and procedure to be performed and side for sampling, if any, was specified. Pt was intubated by anesthesia.  The patient was appropriately draped.  Procedure Findings: Bronchoscope was inserted via ETT  without difficulty.  Posterior oropharynx, epiglottis, arytenoids, false cords and vocal cords were not visualized as these were bypassed by endotracheal tube.   The distal trachea was normal in circumference and appearance without mucosal,  cartilaginous or branching abnormalities.  The main carina was mildly splyed  .   All right and left lobar airways were visualized to the Sub-segmental level.  Sub- sub segmental carinae were identified in all the distal airways.   Secretions were visible in the following airways and appeared to be mucopurulent.  The mucosa was : edematous and friable  Airways were notable for:        exophytic lesions :none        extrinsic compression in the following distributions: at LUL apicoposterior segmetn.       Friable mucosa: yes       Anthrocotic material /pigmentation: none    Pictorial documentation attached: none       Specimens obtained included:   Broncho-alveolar lavage site:LUL and RML  sent for micriobiology and cytology                             150 ml volume infused 90 ml volume returned with mucopurulent appearance  Fluoroscopy Used: none;        Pictorial documentation attached: none         Immediate sampling complications included:none    The bronchoscopy was terminated due to completion of the planned procedure and the bronchoscope was removed.   Total dosage of Lidocaine was ZERO mg Total fluoroscopy time was ZERO minutes  Supplemental oxygen was provided at AS PER ANESTHESIA lpm by nasal canula post operatively  Estimated Blood loss: NONE cc.  Complications included:  NONE   Preliminary CXR findings :  IN PROCESS   Disposition: HOME   Follow up with Dr. Lanney Gins  in 5 days for result discussion.   Claudette Stapler MD  [facility] Division of Pulmonary & Critical Care Medicine

## 2022-01-16 NOTE — Transfer of Care (Signed)
Immediate Anesthesia Transfer of Care Note  Patient: Nicholas Alvarez  Procedure(s) Performed: BRONCHIAL WASHINGS RIGID BRONCHOSCOPY  Patient Location: PACU  Anesthesia Type:General  Level of Consciousness: drowsy and patient cooperative  Airway & Oxygen Therapy: Patient Spontanous Breathing  Post-op Assessment: Report given to RN and Post -op Vital signs reviewed and stable  Post vital signs: Reviewed and stable  Last Vitals:  Vitals Value Taken Time  BP 130/94 01/16/22 1330  Temp 36.1 C 01/16/22 1325  Pulse 77 01/16/22 1334  Resp 20 01/16/22 1334  SpO2 98 % 01/16/22 1334  Vitals shown include unvalidated device data.  Last Pain:  Vitals:   01/16/22 1325  TempSrc:   PainSc: 0-No pain         Complications: No notable events documented.

## 2022-01-16 NOTE — Discharge Instructions (Signed)

## 2022-01-17 NOTE — Anesthesia Postprocedure Evaluation (Signed)
Anesthesia Post Note  Patient: Nicholas Alvarez  Procedure(s) Performed: BRONCHIAL WASHINGS RIGID BRONCHOSCOPY  Patient location during evaluation: Endoscopy Anesthesia Type: General Level of consciousness: awake and alert Pain management: pain level controlled Vital Signs Assessment: post-procedure vital signs reviewed and stable Respiratory status: spontaneous breathing, nonlabored ventilation, respiratory function stable and patient connected to nasal cannula oxygen Cardiovascular status: blood pressure returned to baseline and stable Postop Assessment: no apparent nausea or vomiting Anesthetic complications: no   No notable events documented.   Last Vitals:  Vitals:   01/16/22 1441 01/16/22 1445  BP: 128/71   Pulse: 83   Resp: 20   Temp: (!) 36.4 C   SpO2: 90% 92%    Last Pain:  Vitals:   01/16/22 1441  TempSrc: Temporal  PainSc: 0-No pain                 Louie Boston

## 2022-01-19 ENCOUNTER — Encounter: Payer: Self-pay | Admitting: Pulmonary Disease

## 2022-01-19 LAB — CULTURE, RESPIRATORY W GRAM STAIN

## 2022-01-19 LAB — CYTOLOGY - NON PAP

## 2022-01-26 LAB — VIRUS CULTURE

## 2022-02-06 DIAGNOSIS — Z8614 Personal history of Methicillin resistant Staphylococcus aureus infection: Secondary | ICD-10-CM

## 2022-02-06 HISTORY — DX: Personal history of Methicillin resistant Staphylococcus aureus infection: Z86.14

## 2022-02-10 ENCOUNTER — Other Ambulatory Visit: Payer: Self-pay

## 2022-02-10 ENCOUNTER — Inpatient Hospital Stay
Admission: EM | Admit: 2022-02-10 | Discharge: 2022-02-13 | DRG: 871 | Disposition: A | Discharge status: Home / Self Care | Payer: Medicare Other | Source: Ambulatory Visit | Attending: Internal Medicine | Admitting: Internal Medicine

## 2022-02-10 ENCOUNTER — Emergency Department: Payer: Medicare Other

## 2022-02-10 DIAGNOSIS — A4102 Sepsis due to Methicillin resistant Staphylococcus aureus: Principal | ICD-10-CM | POA: Diagnosis present

## 2022-02-10 DIAGNOSIS — Z7951 Long term (current) use of inhaled steroids: Secondary | ICD-10-CM

## 2022-02-10 DIAGNOSIS — Z809 Family history of malignant neoplasm, unspecified: Secondary | ICD-10-CM

## 2022-02-10 DIAGNOSIS — Z792 Long term (current) use of antibiotics: Secondary | ICD-10-CM

## 2022-02-10 DIAGNOSIS — N400 Enlarged prostate without lower urinary tract symptoms: Secondary | ICD-10-CM | POA: Diagnosis present

## 2022-02-10 DIAGNOSIS — J470 Bronchiectasis with acute lower respiratory infection: Secondary | ICD-10-CM | POA: Diagnosis present

## 2022-02-10 DIAGNOSIS — Z79899 Other long term (current) drug therapy: Secondary | ICD-10-CM

## 2022-02-10 DIAGNOSIS — J479 Bronchiectasis, uncomplicated: Secondary | ICD-10-CM

## 2022-02-10 DIAGNOSIS — Z8616 Personal history of COVID-19: Secondary | ICD-10-CM

## 2022-02-10 DIAGNOSIS — F32A Depression, unspecified: Secondary | ICD-10-CM | POA: Diagnosis present

## 2022-02-10 DIAGNOSIS — Z825 Family history of asthma and other chronic lower respiratory diseases: Secondary | ICD-10-CM

## 2022-02-10 DIAGNOSIS — I10 Essential (primary) hypertension: Secondary | ICD-10-CM | POA: Diagnosis present

## 2022-02-10 DIAGNOSIS — J189 Pneumonia, unspecified organism: Secondary | ICD-10-CM | POA: Diagnosis present

## 2022-02-10 DIAGNOSIS — I252 Old myocardial infarction: Secondary | ICD-10-CM

## 2022-02-10 DIAGNOSIS — Y95 Nosocomial condition: Secondary | ICD-10-CM | POA: Diagnosis present

## 2022-02-10 DIAGNOSIS — Z1152 Encounter for screening for COVID-19: Secondary | ICD-10-CM

## 2022-02-10 DIAGNOSIS — Z8249 Family history of ischemic heart disease and other diseases of the circulatory system: Secondary | ICD-10-CM

## 2022-02-10 DIAGNOSIS — Z951 Presence of aortocoronary bypass graft: Secondary | ICD-10-CM

## 2022-02-10 DIAGNOSIS — M3500 Sicca syndrome, unspecified: Secondary | ICD-10-CM | POA: Diagnosis present

## 2022-02-10 DIAGNOSIS — I2510 Atherosclerotic heart disease of native coronary artery without angina pectoris: Secondary | ICD-10-CM | POA: Diagnosis present

## 2022-02-10 DIAGNOSIS — E785 Hyperlipidemia, unspecified: Secondary | ICD-10-CM | POA: Diagnosis present

## 2022-02-10 DIAGNOSIS — Z888 Allergy status to other drugs, medicaments and biological substances status: Secondary | ICD-10-CM

## 2022-02-10 DIAGNOSIS — A419 Sepsis, unspecified organism: Secondary | ICD-10-CM | POA: Diagnosis present

## 2022-02-10 DIAGNOSIS — J15212 Pneumonia due to Methicillin resistant Staphylococcus aureus: Secondary | ICD-10-CM | POA: Diagnosis present

## 2022-02-10 DIAGNOSIS — I255 Ischemic cardiomyopathy: Secondary | ICD-10-CM | POA: Diagnosis present

## 2022-02-10 DIAGNOSIS — Z7969 Long term (current) use of other immunomodulators and immunosuppressants: Secondary | ICD-10-CM

## 2022-02-10 DIAGNOSIS — Z87891 Personal history of nicotine dependence: Secondary | ICD-10-CM

## 2022-02-10 DIAGNOSIS — D75839 Thrombocytosis, unspecified: Secondary | ICD-10-CM | POA: Diagnosis present

## 2022-02-10 LAB — CBC WITH DIFFERENTIAL/PLATELET
Abs Immature Granulocytes: 0.18 10*3/uL — ABNORMAL HIGH (ref 0.00–0.07)
Basophils Absolute: 0.1 10*3/uL (ref 0.0–0.1)
Basophils Relative: 0 %
Eosinophils Absolute: 0.1 10*3/uL (ref 0.0–0.5)
Eosinophils Relative: 0 %
HCT: 40.5 % (ref 39.0–52.0)
Hemoglobin: 12.6 g/dL — ABNORMAL LOW (ref 13.0–17.0)
Immature Granulocytes: 1 %
Lymphocytes Relative: 5 %
Lymphs Abs: 1 10*3/uL (ref 0.7–4.0)
MCH: 28.4 pg (ref 26.0–34.0)
MCHC: 31.1 g/dL (ref 30.0–36.0)
MCV: 91.2 fL (ref 80.0–100.0)
Monocytes Absolute: 1.5 10*3/uL — ABNORMAL HIGH (ref 0.1–1.0)
Monocytes Relative: 7 %
Neutro Abs: 17.9 10*3/uL — ABNORMAL HIGH (ref 1.7–7.7)
Neutrophils Relative %: 87 %
Platelets: 435 10*3/uL — ABNORMAL HIGH (ref 150–400)
RBC: 4.44 MIL/uL (ref 4.22–5.81)
RDW: 12.7 % (ref 11.5–15.5)
WBC: 20.7 10*3/uL — ABNORMAL HIGH (ref 4.0–10.5)
nRBC: 0 % (ref 0.0–0.2)

## 2022-02-10 LAB — BASIC METABOLIC PANEL
Anion gap: 9 (ref 5–15)
BUN: 20 mg/dL (ref 8–23)
CO2: 24 mmol/L (ref 22–32)
Calcium: 9.8 mg/dL (ref 8.9–10.3)
Chloride: 101 mmol/L (ref 98–111)
Creatinine, Ser: 0.97 mg/dL (ref 0.61–1.24)
GFR, Estimated: >60 mL/min (ref >60)
Glucose, Bld: 123 mg/dL — ABNORMAL HIGH (ref 70–99)
Potassium: 3.9 mmol/L (ref 3.5–5.1)
Sodium: 134 mmol/L — ABNORMAL LOW (ref 135–145)

## 2022-02-10 LAB — LACTIC ACID, PLASMA: Lactic Acid, Venous: 1.9 mmol/L (ref 0.5–1.9)

## 2022-02-10 LAB — TROPONIN I (HIGH SENSITIVITY): Troponin I (High Sensitivity): 7 ng/L (ref <18)

## 2022-02-10 MED ORDER — VANCOMYCIN HCL 2000 MG/400ML IV SOLN
2000.0000 mg | Freq: Once | INTRAVENOUS | Status: AC
  Administered 2022-02-11: 2000 mg via INTRAVENOUS
  Filled 2022-02-10: qty 400

## 2022-02-10 MED ORDER — SODIUM CHLORIDE 0.9 % IV SOLN
2.0000 g | Freq: Once | INTRAVENOUS | Status: AC
  Administered 2022-02-10: 2 g via INTRAVENOUS
  Filled 2022-02-10: qty 12.5

## 2022-02-10 MED ORDER — BENZONATATE 100 MG PO CAPS
100.0000 mg | ORAL_CAPSULE | Freq: Once | ORAL | Status: AC
  Administered 2022-02-10: 100 mg via ORAL
  Filled 2022-02-10: qty 1

## 2022-02-10 MED ORDER — IPRATROPIUM-ALBUTEROL 0.5-2.5 (3) MG/3ML IN SOLN
3.0000 mL | Freq: Once | RESPIRATORY_TRACT | Status: AC
  Administered 2022-02-10: 3 mL via RESPIRATORY_TRACT
  Filled 2022-02-10: qty 3

## 2022-02-10 NOTE — ED Provider Triage Note (Signed)
Emergency Medicine Provider Triage Evaluation Note  Nicholas Alvarez , a 81 y.o. male  was evaluated in triage.  Pt complains of weakness, shortness of breath, loss of appetite x 3 weeks after a "lung procedure." No known fever. High WBC count per PCP and advised to come to the ER.  Physical Exam  There were no vitals taken for this visit. Gen:   Awake, no distress   Resp:  Normal effort  MSK:   Moves extremities without difficulty  Other:    Medical Decision Making  Medically screening exam initiated at 7:33 PM.  Appropriate orders placed.  Nicholas Alvarez was informed that the remainder of the evaluation will be completed by another provider, this initial triage assessment does not replace that evaluation, and the importance of remaining in the ED until their evaluation is complete.    Chinita Pester, FNP 02/10/22 2232

## 2022-02-10 NOTE — ED Triage Notes (Signed)
Pt seen for regular appointment 1-2 days ago. Called back today about elevated WBC count and told to come to ED. Pt had procedure to "vacuum mucous out of lungs" 3 weeks ago and since then has not had a full meal. Pt reports feeling increasingly weak/fatigued/and SOB since the procedure. Denies fever.

## 2022-02-10 NOTE — ED Triage Notes (Signed)
No acute distress noted in triage

## 2022-02-10 NOTE — ED Provider Notes (Signed)
[facility] Provider Note    Event Date/Time   First MD Initiated Contact with Patient 02/10/22 2315     (approximate)   History   Abnormal Lab   HPI  Nicholas Alvarez is a 81 y.o. male with history of hypertension, ischemic cardiomyopathy, Sjogren's syndrome, dyslipidemia who presents emergency department with worsening shortness of breath, cough, wheezing, weakness, decreased oral intake since having a bronchoscopy and bronchial washings with Dr. Karna Christmas on November 10.  Patient grew few MRSA.  He states he has been on antibiotics but cannot recall the name.  He is not sure if he has been on steroids.  Saw his PCP today and had labs that showed a leukocytosis and was sent to the ED.  Denies any fevers, chest pain, vomiting, diarrhea.   History provided by patient.    Past Medical History:  Diagnosis Date   BPH (benign prostatic hyperplasia)    CAD (coronary artery disease)    Post anterior wall myocardial infarction and 5-vessel coronary bypass    COVID-19    Depression    Dyslipidemia    Dyspnea    Hypertension    Interstitial lung disease (HCC)    Ischemic cardiomyopathy    with left ventricular ejection fraction of 35%   Myocardial infarction (HCC) 2010   Pneumonia    Sjogren's syndrome (HCC)     Past Surgical History:  Procedure Laterality Date   BRONCHIAL WASHINGS N/A 01/16/2022   Procedure: BRONCHIAL WASHINGS;  Surgeon: Vida Rigger, MD;  Location: [facility] ORS;  Service: Thoracic;  Laterality: N/A;   CARDIAC CATHETERIZATION  03/2008   COLONOSCOPY     CORONARY ARTERY BYPASS GRAFT  03/12/2008   PROSTATE BIOPSY  11/2009   RIGID BRONCHOSCOPY N/A 01/16/2022   Procedure: RIGID BRONCHOSCOPY;  Surgeon: Vida Rigger, MD;  Location: [facility] ORS;  Service: Thoracic;  Laterality: N/A;    MEDICATIONS:  Prior to Admission medications   Medication Sig Start Date End Date Taking? Authorizing Provider  albuterol (PROVENTIL) (2.5 MG/3ML) 0.083%  nebulizer solution Take 2.5 mg by nebulization 2 (two) times daily.    [provider]  budesonide (PULMICORT) 0.25 MG/2ML nebulizer solution Take 0.25 mg by nebulization 2 (two) times daily.    [provider]  carvedilol (COREG) 25 MG tablet Take 25 mg by mouth 2 (two) times daily with a meal.    [provider]  clopidogrel (PLAVIX) 75 MG tablet Take 75 mg by mouth daily. 08/20/19   [provider]  DULoxetine (CYMBALTA) 60 MG capsule Take 60 mg by mouth daily. 08/20/19   [provider]  finasteride (PROSCAR) 5 MG tablet Take 5 mg by mouth daily.    [provider]  mycophenolate (CELLCEPT) 500 MG tablet Take 1,500 mg by mouth 2 (two) times daily.    [provider]  simvastatin (ZOCOR) 40 MG tablet Take 40 mg by mouth daily.    [provider]  sulfamethoxazole-trimethoprim (BACTRIM) 400-80 MG tablet Take 1 tablet by mouth 3 (three) times a week. Mon, Wed, Fri    [provider]  tamsulosin (FLOMAX) 0.4 MG CAPS capsule Take 0.4 mg by mouth daily. 09/14/19   [provider]    Physical Exam   Triage Vital Signs: ED Triage Vitals  Enc Vitals Group     BP 02/10/22 1933 107/82     Pulse Rate 02/10/22 1933 90     Resp 02/10/22 1933 15     Temp 02/10/22 1933 98.2  F (36.8 C)     Temp Source 02/10/22 1933 Oral     SpO2 02/10/22 1933 95 %     Weight 02/10/22 1936 185 lb (83.9 kg)     Height 02/10/22 1936 5\' 10"  (1.778 m)     Head Circumference --      Peak Flow --      Pain Score 02/10/22 1935 0     Pain Loc --      Pain Edu? --      Excl. in GC? --     Most recent vital signs: Vitals:   02/10/22 2330 02/11/22 0000  BP: 108/72 123/85  Pulse: 87 88  Resp: 16 16  Temp:    SpO2: 92% 97%    CONSTITUTIONAL: Alert and oriented and responds appropriately to questions.  Thin, chronically ill-appearing HEAD: Normocephalic, atraumatic EYES: Conjunctivae clear, pupils appear equal, sclera  nonicteric ENT: normal nose; moist mucous membranes NECK: Supple, normal ROM CARD: RRR; S1 and S2 appreciated; no murmurs, no clicks, no rubs, no gallops RESP: Appears dyspneic but no hypoxia on room air at rest.  Scattered expiratory wheezing.  No rhonchi or rales.  Speaking in short sentences intermittently. ABD/GI: Normal bowel sounds; non-distended; soft, non-tender, no rebound, no guarding, no peritoneal signs BACK: The back appears normal EXT: Normal ROM in all joints; no deformity noted, no edema; no cyanosis, no calf tenderness or calf swelling SKIN: Normal color for age and race; warm; no rash on exposed skin NEURO: Moves all extremities equally, normal speech PSYCH: The patient's mood and manner are appropriate.   ED Results / Procedures / Treatments   LABS: (all labs ordered are listed, but only abnormal results are displayed) Labs Reviewed  CBC WITH DIFFERENTIAL/PLATELET - Abnormal; Notable for the following components:      Result Value   WBC 20.7 (*)    Hemoglobin 12.6 (*)    Platelets 435 (*)    Neutro Abs 17.9 (*)    Monocytes Absolute 1.5 (*)    Abs Immature Granulocytes 0.18 (*)    All other components within normal limits  BASIC METABOLIC PANEL - Abnormal; Notable for the following components:   Sodium 134 (*)    Glucose, Bld 123 (*)    All other components within normal limits  RESP PANEL BY RT-PCR (FLU A&B, COVID) ARPGX2  CULTURE, BLOOD (ROUTINE X 2)  CULTURE, BLOOD (ROUTINE X 2)  EXPECTORATED SPUTUM ASSESSMENT W GRAM STAIN, RFLX TO RESP C  MRSA NEXT GEN BY PCR, NASAL  LACTIC ACID, PLASMA  PROCALCITONIN  URINALYSIS, ROUTINE W REFLEX MICROSCOPIC  LEGIONELLA PNEUMOPHILA SEROGP 1 UR AG  STREP PNEUMONIAE URINARY ANTIGEN  BASIC METABOLIC PANEL  CBC  TROPONIN I (HIGH SENSITIVITY)  TROPONIN I (HIGH SENSITIVITY)     EKG:  EKG Interpretation  Date/Time:    Ventricular Rate:    PR Interval:    QRS Duration:   QT Interval:    QTC Calculation:   R  Axis:     Text Interpretation:           RADIOLOGY: My personal review and interpretation of imaging: Chest x-ray concerning for worsening infiltrates.  I have personally reviewed all radiology reports.   DG Chest Port 1 View  Result Date: 02/10/2022 CLINICAL DATA:  Dyspnea EXAM: PORTABLE CHEST 1 VIEW COMPARISON:  12/05/2019, CT 12/03/2021 FINDINGS: Coarse bilateral asymmetric reticulonodular infiltrates, more prevalent within the lung apices and right lung base are identified, progressive since both prior examinations, and compatible with  progressive chronic atypical infection or acute on chronic infection in the appropriate clinical setting. No pneumothorax or pleural effusion. Coronary artery bypass grafting has been performed. Cardiac size within normal limits. Pulmonary vascularity is normal. No acute bone abnormality. IMPRESSION: 1. Progressive bilateral asymmetric reticulonodular infiltrates, compatible with progressive chronic atypical infection or acute on chronic infection in the appropriate clinical setting. Electronically Signed   By: Helyn NumbersAshesh  Parikh M.D.   On: 02/10/2022 20:14     PROCEDURES:  Critical Care performed: No     .1-3 Lead EKG Interpretation  Performed by: Lillyth Spong, Layla MawKristen N, DO Authorized by: Venicia Vandall, Layla MawKristen N, DO     Interpretation: normal     ECG rate:  90   ECG rate assessment: normal     Rhythm: sinus rhythm     Ectopy: none     Conduction: normal       IMPRESSION / MDM / ASSESSMENT AND PLAN / ED COURSE  I reviewed the triage vital signs and the nursing notes.    Patient here with worsening shortness of breath, wheezing, decreased oral intake, weakness after underwent bronchoscopy and bronchial washings in November for mucous plugging.  He did grow MRSA.  The patient is on the cardiac monitor to evaluate for evidence of arrhythmia and/or significant heart rate changes.   DIFFERENTIAL DIAGNOSIS (includes but not limited to):   Pneumonia,  pneumothorax, CHF, COVID, flu, UTI, dehydration, malnutrition   Patient's presentation is most consistent with acute presentation with potential threat to life or bodily function.   PLAN: Work-up initiated from triage.  Labs show leukocytosis of 20,000 with left shift.  Lactic normal.  Electrolytes normal.  Troponin negative.  Chest x-ray reviewed and interpreted by myself and the radiologist and shows worsening bilateral asymmetric infiltrates compatible with progressive chronic atypical infection versus acute on chronic infection.  Given this new leukocytosis, I am concerned for acute infection.  We will add on a procalcitonin, cultures.  Second troponin pending.  COVID and flu swabs pending.  We will give broad antibiotic coverage to cover for MRSA.  Anticipate admission.   MEDICATIONS GIVEN IN ED: Medications  vancomycin (VANCOREADY) IVPB 2000 mg/400 mL (2,000 mg Intravenous New Bag/Given 02/11/22 0033)  enoxaparin (LOVENOX) injection 40 mg (has no administration in time range)  cefTRIAXone (ROCEPHIN) 2 g in sodium chloride 0.9 % 100 mL IVPB (has no administration in time range)  azithromycin (ZITHROMAX) 500 mg in sodium chloride 0.9 % 250 mL IVPB (has no administration in time range)  0.9 % NaCl with KCl 20 mEq/ L  infusion (has no administration in time range)  acetaminophen (TYLENOL) tablet 650 mg (has no administration in time range)    Or  acetaminophen (TYLENOL) suppository 650 mg (has no administration in time range)  traZODone (DESYREL) tablet 25 mg (has no administration in time range)  magnesium hydroxide (MILK OF MAGNESIA) suspension 30 mL (has no administration in time range)  ondansetron (ZOFRAN) tablet 4 mg (has no administration in time range)    Or  ondansetron (ZOFRAN) injection 4 mg (has no administration in time range)  ipratropium-albuterol (DUONEB) 0.5-2.5 (3) MG/3ML nebulizer solution 3 mL (3 mLs Nebulization Given 02/10/22 2356)  benzonatate (TESSALON) capsule 100 mg  (100 mg Oral Given 02/10/22 2355)  ceFEPIme (MAXIPIME) 2 g in sodium chloride 0.9 % 100 mL IVPB (0 g Intravenous Stopped 02/11/22 0033)     ED COURSE: Second troponin negative.  COVID and flu negative.   CONSULTS:  Consulted and discussed patient's case with  hospitalist, Dr. Arville Care.  I have recommended admission and consulting physician agrees and will place admission orders.  Patient (and family if present) agree with this plan.   I reviewed all nursing notes, vitals, pertinent previous records.  All labs, EKGs, imaging ordered have been independently reviewed and interpreted by myself.    OUTSIDE RECORDS REVIEWED: Reviewed patient's procedure note with pulmonology on 01/16/2022.       FINAL CLINICAL IMPRESSION(S) / ED DIAGNOSES   Final diagnoses:  HCAP (healthcare-associated pneumonia)     Rx / DC Orders   ED Discharge Orders     None        Note:  This document was prepared using Dragon voice recognition software and may include unintentional dictation errors.   Langley Ingalls, Layla Maw, DO 02/11/22 [phone]

## 2022-02-10 NOTE — ED Triage Notes (Signed)
Pt reports SOB has been ongoing for several months.

## 2022-02-11 ENCOUNTER — Encounter: Payer: Self-pay | Admitting: Family Medicine

## 2022-02-11 DIAGNOSIS — J189 Pneumonia, unspecified organism: Secondary | ICD-10-CM | POA: Diagnosis present

## 2022-02-11 DIAGNOSIS — I2510 Atherosclerotic heart disease of native coronary artery without angina pectoris: Secondary | ICD-10-CM | POA: Diagnosis present

## 2022-02-11 DIAGNOSIS — Z79899 Other long term (current) drug therapy: Secondary | ICD-10-CM | POA: Diagnosis not present

## 2022-02-11 DIAGNOSIS — A419 Sepsis, unspecified organism: Secondary | ICD-10-CM | POA: Diagnosis not present

## 2022-02-11 DIAGNOSIS — Z7969 Long term (current) use of other immunomodulators and immunosuppressants: Secondary | ICD-10-CM | POA: Diagnosis not present

## 2022-02-11 DIAGNOSIS — A4102 Sepsis due to Methicillin resistant Staphylococcus aureus: Secondary | ICD-10-CM | POA: Diagnosis present

## 2022-02-11 DIAGNOSIS — I255 Ischemic cardiomyopathy: Secondary | ICD-10-CM | POA: Diagnosis present

## 2022-02-11 DIAGNOSIS — M3500 Sicca syndrome, unspecified: Secondary | ICD-10-CM | POA: Diagnosis present

## 2022-02-11 DIAGNOSIS — E785 Hyperlipidemia, unspecified: Secondary | ICD-10-CM

## 2022-02-11 DIAGNOSIS — Z87891 Personal history of nicotine dependence: Secondary | ICD-10-CM | POA: Diagnosis not present

## 2022-02-11 DIAGNOSIS — J470 Bronchiectasis with acute lower respiratory infection: Secondary | ICD-10-CM | POA: Diagnosis present

## 2022-02-11 DIAGNOSIS — Z888 Allergy status to other drugs, medicaments and biological substances status: Secondary | ICD-10-CM | POA: Diagnosis not present

## 2022-02-11 DIAGNOSIS — Z8249 Family history of ischemic heart disease and other diseases of the circulatory system: Secondary | ICD-10-CM | POA: Diagnosis not present

## 2022-02-11 DIAGNOSIS — J471 Bronchiectasis with (acute) exacerbation: Secondary | ICD-10-CM | POA: Diagnosis not present

## 2022-02-11 DIAGNOSIS — D75839 Thrombocytosis, unspecified: Secondary | ICD-10-CM | POA: Diagnosis present

## 2022-02-11 DIAGNOSIS — I252 Old myocardial infarction: Secondary | ICD-10-CM | POA: Diagnosis not present

## 2022-02-11 DIAGNOSIS — J479 Bronchiectasis, uncomplicated: Secondary | ICD-10-CM

## 2022-02-11 DIAGNOSIS — Z951 Presence of aortocoronary bypass graft: Secondary | ICD-10-CM | POA: Diagnosis not present

## 2022-02-11 DIAGNOSIS — Z792 Long term (current) use of antibiotics: Secondary | ICD-10-CM | POA: Diagnosis not present

## 2022-02-11 DIAGNOSIS — N400 Enlarged prostate without lower urinary tract symptoms: Secondary | ICD-10-CM | POA: Insufficient documentation

## 2022-02-11 DIAGNOSIS — Z7951 Long term (current) use of inhaled steroids: Secondary | ICD-10-CM | POA: Diagnosis not present

## 2022-02-11 DIAGNOSIS — Y95 Nosocomial condition: Secondary | ICD-10-CM | POA: Diagnosis present

## 2022-02-11 DIAGNOSIS — I10 Essential (primary) hypertension: Secondary | ICD-10-CM

## 2022-02-11 DIAGNOSIS — J15212 Pneumonia due to Methicillin resistant Staphylococcus aureus: Secondary | ICD-10-CM | POA: Diagnosis present

## 2022-02-11 DIAGNOSIS — Z809 Family history of malignant neoplasm, unspecified: Secondary | ICD-10-CM | POA: Diagnosis not present

## 2022-02-11 DIAGNOSIS — Z825 Family history of asthma and other chronic lower respiratory diseases: Secondary | ICD-10-CM | POA: Diagnosis not present

## 2022-02-11 DIAGNOSIS — Z8616 Personal history of COVID-19: Secondary | ICD-10-CM | POA: Diagnosis not present

## 2022-02-11 DIAGNOSIS — F32A Depression, unspecified: Secondary | ICD-10-CM | POA: Diagnosis present

## 2022-02-11 DIAGNOSIS — Z1152 Encounter for screening for COVID-19: Secondary | ICD-10-CM | POA: Diagnosis not present

## 2022-02-11 LAB — LACTIC ACID, PLASMA
Lactic Acid, Venous: 1.3 mmol/L (ref 0.5–1.9)
Lactic Acid, Venous: 1.2 mmol/L (ref 0.5–1.9)

## 2022-02-11 LAB — URINALYSIS, ROUTINE W REFLEX MICROSCOPIC
Bacteria, UA: NONE SEEN
Bilirubin Urine: NEGATIVE
Glucose, UA: NEGATIVE mg/dL
Hgb urine dipstick: NEGATIVE
Ketones, ur: 20 mg/dL — AB
Leukocytes,Ua: NEGATIVE
Nitrite: NEGATIVE
Protein, ur: 30 mg/dL — AB
Specific Gravity, Urine: 1.028 (ref 1.005–1.030)
pH: 5 (ref 5.0–8.0)

## 2022-02-11 LAB — COMPREHENSIVE METABOLIC PANEL
ALT: 7 U/L (ref 0–44)
AST: 11 U/L — ABNORMAL LOW (ref 15–41)
Albumin: 2.5 g/dL — ABNORMAL LOW (ref 3.5–5.0)
Alkaline Phosphatase: 54 U/L (ref 38–126)
Anion gap: 8 (ref 5–15)
BUN: 19 mg/dL (ref 8–23)
CO2: 21 mmol/L — ABNORMAL LOW (ref 22–32)
Calcium: 8.8 mg/dL — ABNORMAL LOW (ref 8.9–10.3)
Chloride: 108 mmol/L (ref 98–111)
Creatinine, Ser: 0.8 mg/dL (ref 0.61–1.24)
GFR, Estimated: >60 mL/min (ref >60)
Glucose, Bld: 89 mg/dL (ref 70–99)
Potassium: 3.6 mmol/L (ref 3.5–5.1)
Sodium: 137 mmol/L (ref 135–145)
Total Bilirubin: 0.8 mg/dL (ref 0.3–1.2)
Total Protein: 7 g/dL (ref 6.5–8.1)

## 2022-02-11 LAB — PROTIME-INR
INR: 1.2 (ref 0.8–1.2)
Prothrombin Time: 14.9 seconds (ref 11.4–15.2)

## 2022-02-11 LAB — RESP PANEL BY RT-PCR (FLU A&B, COVID) ARPGX2
Influenza A by PCR: NEGATIVE
Influenza B by PCR: NEGATIVE
SARS Coronavirus 2 by RT PCR: NEGATIVE

## 2022-02-11 LAB — MRSA NEXT GEN BY PCR, NASAL: MRSA by PCR Next Gen: DETECTED — AB

## 2022-02-11 LAB — STREP PNEUMONIAE URINARY ANTIGEN: Strep Pneumo Urinary Antigen: NEGATIVE

## 2022-02-11 LAB — TROPONIN I (HIGH SENSITIVITY): Troponin I (High Sensitivity): 5 ng/L (ref <18)

## 2022-02-11 LAB — APTT: aPTT: 35 seconds (ref 24–36)

## 2022-02-11 LAB — PROCALCITONIN: Procalcitonin: <0.1 ng/mL

## 2022-02-11 MED ORDER — SODIUM CHLORIDE 0.9 % IV SOLN: 2.0000 g | INTRAVENOUS | Status: DC

## 2022-02-11 MED ORDER — ONDANSETRON HCL 4 MG PO TABS: 4.0000 mg | ORAL_TABLET | Freq: Four times a day (QID) | ORAL | Status: DC | PRN

## 2022-02-11 MED ORDER — SULFAMETHOXAZOLE-TRIMETHOPRIM 400-80 MG PO TABS
1.0000 | ORAL_TABLET | ORAL | Status: DC
  Administered 2022-02-11: 1 via ORAL
  Filled 2022-02-11: qty 1

## 2022-02-11 MED ORDER — GUAIFENESIN ER 600 MG PO TB12
600.0000 mg | ORAL_TABLET | Freq: Two times a day (BID) | ORAL | Status: DC
  Administered 2022-02-11 – 2022-02-13 (×6): 600 mg via ORAL
  Filled 2022-02-11 (×6): qty 1

## 2022-02-11 MED ORDER — ACETAMINOPHEN 650 MG RE SUPP: 650.0000 mg | Freq: Four times a day (QID) | RECTAL | Status: DC | PRN

## 2022-02-11 MED ORDER — MYCOPHENOLATE MOFETIL 250 MG PO CAPS
1500.0000 mg | ORAL_CAPSULE | Freq: Two times a day (BID) | ORAL | Status: DC
  Administered 2022-02-11 – 2022-02-13 (×6): 1500 mg via ORAL
  Filled 2022-02-11 (×7): qty 6

## 2022-02-11 MED ORDER — ENOXAPARIN SODIUM 40 MG/0.4ML IJ SOSY
40.0000 mg | PREFILLED_SYRINGE | INTRAMUSCULAR | Status: DC
  Administered 2022-02-11 – 2022-02-13 (×3): 40 mg via SUBCUTANEOUS
  Filled 2022-02-11 (×3): qty 0.4

## 2022-02-11 MED ORDER — CLOPIDOGREL BISULFATE 75 MG PO TABS
75.0000 mg | ORAL_TABLET | Freq: Every day | ORAL | Status: DC
  Administered 2022-02-11 – 2022-02-13 (×3): 75 mg via ORAL
  Filled 2022-02-11 (×3): qty 1

## 2022-02-11 MED ORDER — SODIUM CHLORIDE 0.9 % IV SOLN
2.0000 g | Freq: Three times a day (TID) | INTRAVENOUS | Status: DC
  Administered 2022-02-11 – 2022-02-12 (×4): 2 g via INTRAVENOUS
  Filled 2022-02-11 (×3): qty 12.5
  Filled 2022-02-11 (×2): qty 2

## 2022-02-11 MED ORDER — SODIUM CHLORIDE 0.9 % IV SOLN
500.0000 mg | INTRAVENOUS | Status: DC
  Administered 2022-02-11 – 2022-02-12 (×2): 500 mg via INTRAVENOUS
  Filled 2022-02-11: qty 500
  Filled 2022-02-11: qty 5

## 2022-02-11 MED ORDER — BUDESONIDE 0.25 MG/2ML IN SUSP
0.2500 mg | Freq: Two times a day (BID) | RESPIRATORY_TRACT | Status: DC
  Administered 2022-02-11 – 2022-02-13 (×5): 0.25 mg via RESPIRATORY_TRACT
  Filled 2022-02-11 (×5): qty 2

## 2022-02-11 MED ORDER — VANCOMYCIN HCL IN DEXTROSE 1-5 GM/200ML-% IV SOLN
1000.0000 mg | Freq: Two times a day (BID) | INTRAVENOUS | Status: DC
  Administered 2022-02-11 – 2022-02-13 (×5): 1000 mg via INTRAVENOUS
  Filled 2022-02-11 (×5): qty 200

## 2022-02-11 MED ORDER — FINASTERIDE 5 MG PO TABS
5.0000 mg | ORAL_TABLET | Freq: Every day | ORAL | Status: DC
  Administered 2022-02-11 – 2022-02-13 (×3): 5 mg via ORAL
  Filled 2022-02-11 (×3): qty 1

## 2022-02-11 MED ORDER — IPRATROPIUM-ALBUTEROL 0.5-2.5 (3) MG/3ML IN SOLN
3.0000 mL | Freq: Four times a day (QID) | RESPIRATORY_TRACT | Status: DC
  Administered 2022-02-11 – 2022-02-12 (×4): 3 mL via RESPIRATORY_TRACT
  Filled 2022-02-11 (×5): qty 3

## 2022-02-11 MED ORDER — SIMVASTATIN 20 MG PO TABS
40.0000 mg | ORAL_TABLET | Freq: Every day | ORAL | Status: DC
  Administered 2022-02-11 – 2022-02-13 (×3): 40 mg via ORAL
  Filled 2022-02-11: qty 2
  Filled 2022-02-11: qty 4
  Filled 2022-02-11 (×2): qty 2

## 2022-02-11 MED ORDER — MAGNESIUM HYDROXIDE 400 MG/5ML PO SUSP: 30.0000 mL | Freq: Every day | ORAL | Status: DC | PRN

## 2022-02-11 MED ORDER — HYDROCOD POLI-CHLORPHE POLI ER 10-8 MG/5ML PO SUER: 5.0000 mL | Freq: Two times a day (BID) | ORAL | Status: DC | PRN

## 2022-02-11 MED ORDER — DULOXETINE HCL 30 MG PO CPEP
60.0000 mg | ORAL_CAPSULE | Freq: Every day | ORAL | Status: DC
  Administered 2022-02-11 – 2022-02-13 (×3): 60 mg via ORAL
  Filled 2022-02-11: qty 1
  Filled 2022-02-11 (×2): qty 2

## 2022-02-11 MED ORDER — CARVEDILOL 25 MG PO TABS
25.0000 mg | ORAL_TABLET | Freq: Two times a day (BID) | ORAL | Status: DC
  Administered 2022-02-11 – 2022-02-13 (×5): 25 mg via ORAL
  Filled 2022-02-11: qty 1
  Filled 2022-02-11: qty 4
  Filled 2022-02-11 (×3): qty 1

## 2022-02-11 MED ORDER — ACETAMINOPHEN 325 MG PO TABS: 650.0000 mg | ORAL_TABLET | Freq: Four times a day (QID) | ORAL | Status: DC | PRN

## 2022-02-11 MED ORDER — ONDANSETRON HCL 4 MG/2ML IJ SOLN: 4.0000 mg | Freq: Four times a day (QID) | INTRAMUSCULAR | Status: DC | PRN

## 2022-02-11 MED ORDER — ACETYLCYSTEINE 20 % IN SOLN
4.0000 mL | Freq: Every day | RESPIRATORY_TRACT | Status: DC | PRN
  Filled 2022-02-11 (×2): qty 4

## 2022-02-11 MED ORDER — TAMSULOSIN HCL 0.4 MG PO CAPS
0.4000 mg | ORAL_CAPSULE | Freq: Every day | ORAL | Status: DC
  Administered 2022-02-11 – 2022-02-13 (×3): 0.4 mg via ORAL
  Filled 2022-02-11 (×3): qty 1

## 2022-02-11 MED ORDER — BUPROPION HCL ER (XL) 150 MG PO TB24
150.0000 mg | ORAL_TABLET | Freq: Every day | ORAL | Status: DC
  Administered 2022-02-11 – 2022-02-13 (×3): 150 mg via ORAL
  Filled 2022-02-11 (×3): qty 1

## 2022-02-11 MED ORDER — TRAZODONE HCL 50 MG PO TABS: 25.0000 mg | ORAL_TABLET | Freq: Every evening | ORAL | Status: DC | PRN

## 2022-02-11 MED ORDER — POTASSIUM CHLORIDE IN NACL 20-0.9 MEQ/L-% IV SOLN
INTRAVENOUS | Status: AC
  Filled 2022-02-11 (×2): qty 1000

## 2022-02-11 NOTE — Progress Notes (Signed)
Pharmacy Antibiotic Note  Nicholas Alvarez is a 81 y.o. male admitted on 02/10/2022 with pneumonia.  Pharmacy has been consulted for Cefepime dosing. Patient with bronchoscopy 01/16/2022 with BAL culture growing MRSA - unable to find that antibiotic was prescribed. It was resistant to tetracycline and TMP/SMZ. He is on TMP/SMZ MWF prior to admission per pulmonary. Appears to be for prophylaxis  Today, 02/11/2022 Day #1 antibiotics vanco/cefepime/azithromycin  WBC elevated 20.7 Renal: SCr WNL Afebrile Patient received vancomyicn 2gm x1 at 00:30 12/6  Plan: Continue Cefepime 2 gm IV Q8H and f/u ability to stop based on recent BAL culture.  Pulmonary to evaluate Start vancomycin 1gm IV q12h For estimated AUC = 482 (goal 400-600).  Vanc trough estimated - 13.8 mcg/mL Using SCr 0.8 mg/dl and Vd 0.72 L/kg Follow renal function and need to check levels Only PO option for the MRSA isolated on 11/10 is linezolid - not contraindicated with doluxetine or bupropion but monitor for serotonin syndrome  Follow-up plan    Height: _0  (177.8 cm) Weight: 83.9 kg (185 lb) IBW/kg (Calculated) : 73  Temp (24hrs), Avg:98.5 F (36.9 C), Min:98.1 F (36.7 C), Max:99.1 F (37.3 C)  Recent Labs  Lab 02/10/22 1952 02/11/22 0447 02/11/22 0806  WBC 20.7*  --   --   CREATININE 0.97 0.80  --   LATICACIDVEN 1.9 1.3 1.2     Estimated Creatinine Clearance: 77.3 mL/min (by C-G formula based on SCr of 0.8 mg/dL).    Allergies  Allergen Reactions   Ace Inhibitors Cough    Antimicrobials this admission: 12/6 vanco >> 12/5 cefepime >> 12/6 azith >>  Dose adjustments this admission:   Microbiology results: 11/10 BAL cx: MRSA (R to TC, TMP/SMZ) 11/10: virus cx neg, AFB neg MRSA PCR + 12/5 Bcx NGTD  Thank you for allowing pharmacy to be a part of this patient's care.  Doreene Eland, PharmD, BCPS, BCIDP Work Cell: [phone] 02/11/2022 12:11 PM

## 2022-02-11 NOTE — H&P (Signed)
[place]   PATIENT NAME: Nicholas Alvarez    MR#:  [id number]  DATE OF BIRTH:  [birth year]  DATE OF ADMISSION:  02/10/2022  PRIMARY CARE PHYSICIAN: Vida Rigger, MD   Patient is coming from: Home  REQUESTING/REFERRING PHYSICIAN: Ward, Layla Maw, DO  CHIEF COMPLAINT:   Chief Complaint  Patient presents with   Abnormal Lab    HISTORY OF PRESENT ILLNESS:  Nicholas Alvarez is a 81 y.o. Caucasian male with medical history significant for BPH, coronary artery disease, depression, dyslipidemia, hypertension, interstitial lung disease and bronchiectasis and ischemic cardiomyopathy as well as Sjogren's syndrome, who presented to the emergency room with acute onset of worsening dyspnea with associated cough productive of clear sputum as well as wheezing.  He admitted to generalized weakness and diminished appetite.  His symptoms have been worse since he had a bronchoscopy and bronchial washings with Dr.Aleskerov on November 10.  At that time he grew MRSA.  He has been on antibiotics in the interim.  No chest pain or palpitations.  No nausea or vomiting or abdominal pain.  No dysuria, oliguria or hematuria or flank pain.  No headache or dizziness or blurred vision.  ED Course: The patient came to the ER, vital signs were within normal.  Labs revealed sodium of 134 with otherwise unremarkable BMP.  High-sensitivity troponin I was 7 and later 5.  Lactic acid was 1.9.  Procalcitonin was less than 0.1.  CBC showed leukocytosis of 20.7 with neutrophilia and thrombocytosis of 445.Marland Kitchen  Influenza antigens and COVID-19 PCR came back negative.  2 blood cultures were drawn. Imaging: Portable chest x-ray showed the following: Progressive bilateral asymmetric reticulonodular infiltrates, compatible with progressive chronic atypical infection or acute on chronic infection in the appropriate clinical setting.  The patient was given IV vancomycin and cefepime as well as Tessalon and DuoNeb.  He will be  admitted to a medical telemetry bed for further evaluation and management. PAST MEDICAL HISTORY:   Past Medical History:  Diagnosis Date   BPH (benign prostatic hyperplasia)    CAD (coronary artery disease)    Post anterior wall myocardial infarction and 5-vessel coronary bypass    COVID-19    Depression    Dyslipidemia    Dyspnea    Hypertension    Interstitial lung disease (HCC)    Ischemic cardiomyopathy    with left ventricular ejection fraction of 35%   Myocardial infarction (HCC) 2010   Pneumonia    Sjogren's syndrome (HCC)     PAST SURGICAL HISTORY:   Past Surgical History:  Procedure Laterality Date   BRONCHIAL WASHINGS N/A 01/16/2022   Procedure: BRONCHIAL WASHINGS;  Surgeon: Vida Rigger, MD;  Location: [facility] ORS;  Service: Thoracic;  Laterality: N/A;   CARDIAC CATHETERIZATION  03/2008   COLONOSCOPY     CORONARY ARTERY BYPASS GRAFT  03/12/2008   PROSTATE BIOPSY  11/2009   RIGID BRONCHOSCOPY N/A 01/16/2022   Procedure: RIGID BRONCHOSCOPY;  Surgeon: Vida Rigger, MD;  Location: [facility] ORS;  Service: Thoracic;  Laterality: N/A;    SOCIAL HISTORY:   Social History   Tobacco Use   Smoking status: Former   Smokeless tobacco: Never   Tobacco comments:    Quit around age 63  Substance Use Topics   Alcohol use: Yes    Alcohol/week: 12.0 standard drinks of alcohol    Types: 12 Shots of liquor per week    FAMILY HISTORY:   Family History  Problem Relation Age of Onset  Coronary artery disease Other    Heart attack Sister 82   Heart attack Maternal Grandfather 40   Heart attack Paternal Grandfather 70   COPD Mother    Pneumonia Father    Cancer Sister     DRUG ALLERGIES:   Allergies  Allergen Reactions   Ace Inhibitors Cough    REVIEW OF SYSTEMS:   ROS As per history of present illness. All pertinent systems were reviewed above. Constitutional, HEENT, cardiovascular, respiratory, GI, GU, musculoskeletal, neuro, psychiatric, endocrine,  integumentary and hematologic systems were reviewed and are otherwise negative/unremarkable except for positive findings mentioned above in the HPI.   MEDICATIONS AT HOME:   Prior to Admission medications   Medication Sig Start Date End Date Taking? Authorizing Provider  albuterol (PROVENTIL) (2.5 MG/3ML) 0.083% nebulizer solution Take 2.5 mg by nebulization 2 (two) times daily.   Yes [provider]  budesonide (PULMICORT) 0.25 MG/2ML nebulizer solution Take 0.25 mg by nebulization 2 (two) times daily.   Yes [provider]  buPROPion (WELLBUTRIN XL) 150 MG 24 hr tablet Take 150 mg by mouth daily. 02/06/22 02/06/23 Yes [provider]  carvedilol (COREG) 25 MG tablet Take 25 mg by mouth 2 (two) times daily with a meal.   Yes [provider]  clopidogrel (PLAVIX) 75 MG tablet Take 75 mg by mouth daily. 08/20/19  Yes [provider]  DULoxetine (CYMBALTA) 60 MG capsule Take 60 mg by mouth daily. 08/20/19  Yes [provider]  finasteride (PROSCAR) 5 MG tablet Take 5 mg by mouth daily.   Yes [provider]  mycophenolate (CELLCEPT) 500 MG tablet Take 1,500 mg by mouth 2 (two) times daily.   Yes [provider]  simvastatin (ZOCOR) 40 MG tablet Take 40 mg by mouth daily.   Yes [provider]  sulfamethoxazole-trimethoprim (BACTRIM) 400-80 MG tablet Take 1 tablet by mouth 3 (three) times a week. Mon, Wed, Fri   Yes [provider]  tamsulosin (FLOMAX) 0.4 MG CAPS capsule Take 0.4 mg by mouth daily. 09/14/19  Yes [provider]  acetylcysteine (MUCOMYST) 20 % nebulizer solution Take 4 mLs by nebulization daily as needed.    [provider]      VITAL SIGNS:  Blood pressure 110/66, pulse 87, temperature 99.1 F (37.3 C), temperature source Oral, resp. rate 19, height 5\' 10"  (1.778 m), weight 83.9 kg, SpO2 91 %.  PHYSICAL EXAMINATION:  Physical Exam  GENERAL:  81 y.o.-year-old Caucasian  male patient lying in the bed with no acute distress.  EYES: Pupils equal, round, reactive to light and accommodation. No scleral icterus. Extraocular muscles intact.  HEENT: Head atraumatic, normocephalic. Oropharynx and nasopharynx clear.  NECK:  Supple, no jugular venous distention. No thyroid enlargement, no tenderness.  LUNGS: Diminished bibasilar breath sounds with bibasal crackles.. No use of accessory muscles of respiration.  CARDIOVASCULAR: Regular rate and rhythm, S1, S2 normal. No murmurs, rubs, or gallops.  ABDOMEN: Soft, nondistended, nontender. Bowel sounds present. No organomegaly or mass.  EXTREMITIES: No pedal edema, cyanosis, or clubbing.  NEUROLOGIC: Cranial nerves II through XII are intact. Muscle strength 5/5 in all extremities. Sensation intact. Gait not checked.  PSYCHIATRIC: The patient is alert and oriented x 3.  Normal affect and good eye contact. SKIN: No obvious rash, lesion, or ulcer.   LABORATORY PANEL:   CBC Recent Labs  Lab 02/10/22 1952  WBC 20.7*  HGB 12.6*  HCT 40.5  PLT 435*   ------------------------------------------------------------------------------------------------------------------  Chemistries  Recent Labs  Lab 02/10/22 1952  NA 134*  K 3.9  CL 101  CO2 24  GLUCOSE 123*  BUN 20  CREATININE 0.97  CALCIUM 9.8   ------------------------------------------------------------------------------------------------------------------  Cardiac Enzymes No results for input(s): "TROPONINI" in the last 168 hours. ------------------------------------------------------------------------------------------------------------------  RADIOLOGY:  DG Chest Port 1 View  Result Date: 02/10/2022 CLINICAL DATA:  Dyspnea EXAM: PORTABLE CHEST 1 VIEW COMPARISON:  12/05/2019, CT 12/03/2021 FINDINGS: Coarse bilateral asymmetric reticulonodular infiltrates, more prevalent within the lung apices and right lung base are identified, progressive since both prior  examinations, and compatible with progressive chronic atypical infection or acute on chronic infection in the appropriate clinical setting. No pneumothorax or pleural effusion. Coronary artery bypass grafting has been performed. Cardiac size within normal limits. Pulmonary vascularity is normal. No acute bone abnormality. IMPRESSION: 1. Progressive bilateral asymmetric reticulonodular infiltrates, compatible with progressive chronic atypical infection or acute on chronic infection in the appropriate clinical setting. Electronically Signed   By: Helyn Numbers M.D.   On: 02/10/2022 20:14      IMPRESSION AND PLAN:  Assessment and Plan: * Sepsis due to pneumonia (HCC) - Sepsis is manifested by leukocytosis and tachypnea of 24, the later occurring at 2 AM. - The patient will be admitted to a medical telemetry bed. - We will continue antibiotic therapy with IV cefepime and vancomycin as well as Zithromax. - Pulmonary consult will be obtained. - I notified Dr. Lessie Dings about the patient. - Mucolytic therapy and bronchodilator therapy will be provided. - We will follow blood and sputum culture. - We will obtain pneumonia antigens.  Bronchiectasis (HCC) - He may be having exacerbation. - We will continue as inhalers. - He will be placed on bronchodilator therapy on a scheduled and as needed basis. - We will continue his as needed Mucomyst nebulizer.  Dyslipidemia - We will continue statin therapy.  Essential hypertension - We will continue his Coreg.  Depression - We will continue Wellbutrin XL.  BPH (benign prostatic hyperplasia) - We will continue Proscar and Flomax.   DVT prophylaxis: Lovenox.  Advanced Care Planning:  Code Status: full code.  Family Communication:  The plan of care was discussed in details with the patient (and family). I answered all questions. The patient agreed to proceed with the above mentioned plan. Further management will depend upon hospital  course. Disposition Plan: Back to previous home environment Consults called: Pulmonology All the records are reviewed and case discussed with ED provider.  Status is: Inpatient   At the time of the admission, it appears that the appropriate admission status for this patient is inpatient.  This is judged to be reasonable and necessary in order to provide the required intensity of service to ensure the patient's safety given the presenting symptoms, physical exam findings and initial radiographic and laboratory data in the context of comorbid conditions.  The patient requires inpatient status due to high intensity of service, high risk of further deterioration and high frequency of surveillance required.  I certify that at the time of admission, it is my clinical judgment that the patient will require inpatient hospital care extending more than 2 midnights.                            Dispo: The patient is from: Home              Anticipated d/c is to: Home  Patient currently is not medically stable to d/c.              Difficult to place patient: No  Hannah BeatJan A Zyasia Halbleib M.D on 02/11/2022 at 2:58 AM  [facility]   From 7 PM-7 AM, contact night-coverage [email or web address]  CC: Primary care physician; Vida RiggerAleskerov, Fuad, MD

## 2022-02-11 NOTE — Assessment & Plan Note (Signed)
-   He may be having exacerbation. - We will continue as inhalers. - He will be placed on bronchodilator therapy on a scheduled and as needed basis. - We will continue his as needed Mucomyst nebulizer.

## 2022-02-11 NOTE — Assessment & Plan Note (Signed)
-   We will continue his Coreg.

## 2022-02-11 NOTE — Progress Notes (Signed)
Pharmacy Antibiotic Note  Nicholas Alvarez is a 81 y.o. male admitted on 02/10/2022 with pneumonia.  Pharmacy has been consulted for Cefepime dosing.  Plan: Cefepime 2 gm IV X 1 given in ED on 12/5 @ 2356. Cefepime 2 gm IV Q8H ordered to start on 12/6 @ 0800.   Height: 5\' 10"  (177.8 cm) Weight: 83.9 kg (185 lb) IBW/kg (Calculated) : 73  Temp (24hrs), Avg:98.2 F (36.8 C), Min:98.2 F (36.8 C), Max:98.2 F (36.8 C)  Recent Labs  Lab 02/10/22 1952  WBC 20.7*  CREATININE 0.97  LATICACIDVEN 1.9    Estimated Creatinine Clearance: 63.8 mL/min (by C-G formula based on SCr of 0.97 mg/dL).    Allergies  Allergen Reactions   Ace Inhibitors Cough    Antimicrobials this admission:   >>    >>   Dose adjustments this admission:   Microbiology results:  BCx:   UCx:    Sputum:    MRSA PCR:   Thank you for allowing pharmacy to be a part of this patient's care.  Jude Naclerio D 02/11/2022 1:37 AM

## 2022-02-11 NOTE — Assessment & Plan Note (Signed)
-   We will continue statin therapy. 

## 2022-02-11 NOTE — Consult Note (Signed)
PULMONOLOGY         Date: 02/11/2022,   MRN# [id number] Nicholas Alvarez 03-13-1940     AdmissionWeight: 83.9 kg                 CurrentWeight: 83.9 kg  Referring provider: Dr Mal Misty   CHIEF COMPLAINT:    Multifocal pneumonia with failure of outpatient therapy.    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  Most recently has developed a episode of recurrent pneumonia with with failure of outpatient antibiotic therapy.  He has had copious amounts of phlegm with forceful cough that is severe in intensity and lasting day and night.  He has been progressively more weak and has also lost weight due to diminished appetite.  Since he has had numerous courses of antibiotics we proceeded with bronchoscopic airway inspection and noted multiple areas with mucous plugging which were evacuated.  He had bronchoalveolar lavage invasive respiratory culture performed which is returned with MRSA.  Today he is status post IV vancomycin and reports slight improvement clinically with less chest discomfort.  His wife is at bedside during my evaluation and reports that he has been progressively getting weaker and she is very thankful for medical care in the hospital.  Patient is now without labored breathing and appears to be more stable.  PCCM consultation for additional evaluation management of this complex respiratory patient.   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[place])    Ischemic cardiomyopathy    with left ventricular ejection fraction of 35%    Myocardial infarction ([place]) 2010   Pneumonia    Sjogren's syndrome ([place])      SURGICAL HISTORY   Past Surgical History:  Procedure Laterality Date   BRONCHIAL WASHINGS N/A 01/16/2022   Procedure: BRONCHIAL WASHINGS;  Surgeon: Ottie Glazier, MD;  Location: [facility] ORS;  Service: Thoracic;  Laterality: N/A;   CARDIAC CATHETERIZATION  03/2008   COLONOSCOPY     CORONARY ARTERY BYPASS GRAFT  03/12/2008   PROSTATE BIOPSY  11/2009   RIGID BRONCHOSCOPY N/A 01/16/2022   Procedure: RIGID BRONCHOSCOPY;  Surgeon: Ottie Glazier, MD;  Location: [facility] ORS;  Service: Thoracic;  Laterality: N/A;     FAMILY HISTORY   Family History  Problem Relation Age of Onset   Coronary artery disease Other    Heart attack Sister 31   Heart attack Maternal Grandfather 8   Heart attack Paternal Grandfather 56   COPD Mother    Pneumonia Father    Cancer Sister      SOCIAL HISTORY   Social History   Tobacco Use   Smoking status: Former   Smokeless tobacco: Never   Tobacco comments:    Quit around age 71  Vaping Use   Vaping Use: Never used  Substance Use Topics   Alcohol use: Yes    Alcohol/week: 12.0 standard drinks of alcohol    Types: 12 Shots of liquor per week   Drug use: No     MEDICATIONS    Home  Medication:  Current Outpatient Rx   Order #: [id number] Class: Historical Med   Order #: [id number] Class: Historical Med   Order #: [id number] Class: Historical Med   Order #: [id number] Class: Historical Med   Order #: [id number] Class: Historical Med   Order #: [id number] Class: Historical Med   Order #: [id number] Class: Historical Med   Order #: [id number] Class: Historical Med   Order #: [id number] Class: Historical Med   Order #: [id number] Class: Historical Med   Order #: [id number] Class: Historical Med   Order #: [id number] Class: Historical Med    Current Medication:  Current Facility-Administered Medications:    0.9 % NaCl with KCl 20 mEq/ L  infusion, , Intravenous, Continuous, Mansy,  Arvella Merles, MD, Last Rate: 125 mL/hr at 02/11/22 0105, New Bag at 02/11/22 0105   acetaminophen (TYLENOL) tablet 650 mg, 650 mg, Oral, Q6H PRN **OR** acetaminophen (TYLENOL) suppository 650 mg, 650 mg, Rectal, Q6H PRN, Mansy, Jan A, MD   acetylcysteine (MUCOMYST) 20 % nebulizer / oral solution 4 mL, 4 mL, Nebulization, Daily PRN, Mansy, Jan A, MD   azithromycin (ZITHROMAX) 500 mg in sodium chloride 0.9 % 250 mL IVPB, 500 mg, Intravenous, Q24H, Mansy, Jan A, MD, Stopped at 02/11/22 0214   budesonide (PULMICORT) nebulizer solution 0.25 mg, 0.25 mg, Nebulization, BID, Mansy, Jan A, MD   buPROPion (WELLBUTRIN XL) 24 hr tablet 150 mg, 150 mg, Oral, Daily, Mansy, Jan A, MD   carvedilol (COREG) tablet 25 mg, 25 mg, Oral, BID WC, Mansy, Jan A, MD   ceFEPIme (MAXIPIME) 2 g in sodium chloride 0.9 % 100 mL IVPB, 2 g, Intravenous, Q8H, Mansy, Jan A, MD   chlorpheniramine-HYDROcodone (TUSSIONEX) 10-8 MG/5ML suspension 5 mL, 5 mL, Oral, Q12H PRN, Mansy, Jan A, MD   clopidogrel (PLAVIX) tablet 75 mg, 75 mg, Oral, Daily, Mansy, Jan A, MD   DULoxetine (CYMBALTA) DR capsule 60 mg, 60 mg, Oral, Daily, Mansy, Jan A, MD   enoxaparin (LOVENOX) injection 40 mg, 40 mg, Subcutaneous, Q24H, Mansy, Jan A, MD   finasteride (PROSCAR) tablet 5 mg, 5 mg, Oral, Daily, Mansy, Jan A, MD   guaiFENesin (MUCINEX) 12 hr tablet 600 mg, 600 mg, Oral, BID, Mansy, Jan A, MD, 600 mg at 02/11/22 0340   ipratropium-albuterol (DUONEB) 0.5-2.5 (3) MG/3ML nebulizer solution 3 mL, 3 mL, Nebulization, QID, Mansy, Jan A, MD   magnesium hydroxide (MILK OF MAGNESIA) suspension 30 mL, 30 mL, Oral, Daily PRN, Mansy, Jan A, MD   mycophenolate (CELLCEPT) capsule 1,500 mg, 1,500 mg, Oral, BID, Mansy, Jan A, MD, 1,500 mg at 02/11/22 0254   ondansetron (ZOFRAN) tablet 4 mg, 4 mg, Oral, Q6H PRN **OR** ondansetron (ZOFRAN) injection 4 mg, 4 mg, Intravenous, Q6H PRN, Mansy, Jan A, MD   simvastatin (ZOCOR) tablet 40 mg, 40 mg, Oral, Daily, Mansy, Jan A, MD    sulfamethoxazole-trimethoprim (BACTRIM) 400-80 MG per tablet 1 tablet, 1 tablet, Oral, Once per day on Mon Wed Fri, Mansy, Jan A, MD   tamsulosin [facility]) capsule 0.4 mg, 0.4 mg, Oral, Daily, Mansy, Jan A, MD   traZODone (DESYREL) tablet 25 mg, 25 mg, Oral, QHS PRN, Mansy, Jan A, MD  Current Outpatient Medications:    albuterol (PROVENTIL) (2.5 MG/3ML) 0.083% nebulizer solution, Take 2.5 mg by nebulization 2 (two) times daily., Disp: , Rfl:    budesonide (PULMICORT) 0.25 MG/2ML nebulizer solution, Take 0.25 mg by nebulization 2 (two) times daily., Disp: , Rfl:    buPROPion (WELLBUTRIN XL) 150 MG 24 hr tablet, Take 150 mg by mouth daily., Disp: ,  Rfl:    carvedilol (COREG) 25 MG tablet, Take 25 mg by mouth 2 (two) times daily with a meal., Disp: , Rfl:    clopidogrel (PLAVIX) 75 MG tablet, Take 75 mg by mouth daily., Disp: , Rfl:    DULoxetine (CYMBALTA) 60 MG capsule, Take 60 mg by mouth daily., Disp: , Rfl:    finasteride (PROSCAR) 5 MG tablet, Take 5 mg by mouth daily., Disp: , Rfl:    mycophenolate (CELLCEPT) 500 MG tablet, Take 1,500 mg by mouth 2 (two) times daily., Disp: , Rfl:    simvastatin (ZOCOR) 40 MG tablet, Take 40 mg by mouth daily., Disp: , Rfl:    sulfamethoxazole-trimethoprim (BACTRIM) 400-80 MG tablet, Take 1 tablet by mouth 3 (three) times a week. Mon, Wed, Fri, Disp: , Rfl:    tamsulosin (FLOMAX) 0.4 MG CAPS capsule, Take 0.4 mg by mouth daily., Disp: , Rfl:    acetylcysteine (MUCOMYST) 20 % nebulizer solution, Take 4 mLs by nebulization daily as needed., Disp: , Rfl:     ALLERGIES   Ace inhibitors     REVIEW OF SYSTEMS    Review of Systems:  Gen:  Denies  fever, sweats, chills weigh loss  HEENT: Denies blurred vision, double vision, ear pain, eye pain, hearing loss, nose bleeds, sore throat Cardiac:  No dizziness, chest pain or heaviness, chest tightness,edema Resp:   reports dyspnea chronically  Gi: Denies swallowing difficulty, stomach pain, nausea or  vomiting, diarrhea, constipation, bowel incontinence Gu:  Denies bladder incontinence, burning urine Ext:   Denies Joint pain, stiffness or swelling Skin: Denies  skin rash, easy bruising or bleeding or hives Endoc:  Denies polyuria, polydipsia , polyphagia or weight change Psych:   Denies depression, insomnia or hallucinations   Other:  All other systems negative   VS: BP 124/72   Pulse 84   Temp 98.5 F (36.9 C) (Oral)   Resp (!) 22   Ht _0  (1.778 m)   Wt 83.9 kg   SpO2 94%   BMI 26.54 kg/m      PHYSICAL EXAM    GENERAL:NAD, no fevers, chills, no weakness no fatigue HEAD: Normocephalic, atraumatic.  EYES: Pupils equal, round, reactive to light. Extraocular muscles intact. No scleral icterus.  MOUTH: Moist mucosal membrane. Dentition intact. No abscess noted.  EAR, NOSE, THROAT: Clear without exudates. No external lesions.  NECK: Supple. No thyromegaly. No nodules. No JVD.  PULMONARY: decreased breath sounds with mild rhonchi worse at bases bilaterally.  CARDIOVASCULAR: S1 and S2. Regular rate and rhythm. No murmurs, rubs, or gallops. No edema. Pedal pulses 2+ bilaterally.  GASTROINTESTINAL: Soft, nontender, nondistended. No masses. Positive bowel sounds. No hepatosplenomegaly.  MUSCULOSKELETAL: No swelling, clubbing, or edema. Range of motion full in all extremities.  NEUROLOGIC: Cranial nerves II through XII are intact. No gross focal neurological deficits. Sensation intact. Reflexes intact.  SKIN: No ulceration, lesions, rashes, or cyanosis. Skin warm and dry. Turgor intact.  PSYCHIATRIC: Mood, affect within normal limits. The patient is awake, alert and oriented x 3. Insight, judgment intact.       IMAGING   Reviewed CT chest 12/05/2021 with multifocal pneumonia and chest x-ray with similar  ASSESSMENT/PLAN   Recurrent pneumonia  -Status post bronchoscopy with bronchoalveolar lavage growing MRSA -Patient has not had treatment for MRSA and has been on Bactrim  and outpatient which seems to be resistant per sensitivity panel -Agree with current antimicrobials including Zithromax, cefepime and vancomycin-I would be okay with narrowing to vancomycin only  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  -Continue CellCept 1500 mg twice daily  Coronary artery disease -Continue home regimen -Coreg -Plavix  Chronic bronchiectasis  -Mucomyst 20% nebulized 4 mL twice daily  GI DVT prophylaxis -Continue with Lovenox      Thank you for allowing me to participate in the care of this patient.   Patient/Family are satisfied with care plan and all questions have been answered.    Provider disclosure: Patient with at least one acute or chronic illness or injury that poses a threat to life or bodily function and is being managed actively during this encounter.  All of the below services have been performed independently by signing provider:  review of prior documentation from internal and or external health records.  Review of previous and current lab results.  Interview and comprehensive assessment during patient visit today. Review of current and previous chest radiographs/CT scans. Discussion of management and test interpretation with health care team and patient/family.   This document was prepared using Dragon voice recognition software and may include unintentional dictation errors.     Ottie Glazier, M.D.  Division of Pulmonary & Critical Care Medicine

## 2022-02-11 NOTE — Assessment & Plan Note (Signed)
-   Sepsis is manifested by leukocytosis and tachypnea of 24, the later occurring at 2 AM. - The patient will be admitted to a medical telemetry bed. - We will continue antibiotic therapy with IV cefepime and vancomycin as well as Zithromax. - Pulmonary consult will be obtained. - I notified Dr. Lessie Dings about the patient. - Mucolytic therapy and bronchodilator therapy will be provided. - We will follow blood and sputum culture. - We will obtain pneumonia antigens.

## 2022-02-11 NOTE — Assessment & Plan Note (Addendum)
-   We will continue Wellbutrin XL. 

## 2022-02-11 NOTE — Progress Notes (Signed)
PHARMACY -  BRIEF ANTIBIOTIC NOTE   Pharmacy has received consult(s) for Vancomycin , Cefepime from an ED provider.  The patient's profile has been reviewed for ht/wt/allergies/indication/available labs.    One time order(s) placed for Vancomycin 2 gm IV X 1 and Cefepime 2 gm IV X 1   Further antibiotics/pharmacy consults should be ordered by admitting physician if indicated.                       Thank you, Okie Bogacz D 02/11/2022  12:43 AM

## 2022-02-11 NOTE — ED Notes (Signed)
Request made for transport to the floor ?

## 2022-02-11 NOTE — Progress Notes (Addendum)
Patient was seen and examined in the emergency room.  He complains of cough and shortness of breath with exertion but breathing is okay at rest.  No chest pain.  Vitals signs are stable, he is tolerating room air.  He is not in any respiratory distress at rest.  Chart review showed that bronchoalveolar lavage from 01/16/2022 showed MRSA that is resistant to Bactrim.  He had been on Bactrim until about a week prior to admission.  MRSA PCR screen was positive.  Continue IV cefazolin and azithromycin for pneumonia.  Add IV vancomycin because of MRSA and bronchoalveolar lavage from 01/16/2022.  Discussed with Rachel Bo, pharmacist and Dr. Lanney Gins, pulmonologist.

## 2022-02-11 NOTE — ED Provider Notes (Incomplete)
[facility] Provider Note    Event Date/Time   First MD Initiated Contact with Patient 02/10/22 2315     (approximate)   History   Abnormal Lab   HPI  Nicholas Alvarez is a 81 y.o. male with history of hypertension, ischemic cardiomyopathy, Sjogren's syndrome, dyslipidemia who presents emergency department with worsening shortness of breath, cough, wheezing, weakness, decreased oral intake since having a bronchoscopy and bronchial washings with Dr. Karna Christmas on November 10.  Patient grew few MRSA.  He states he has been on antibiotics but cannot recall the name.  He is not sure if he has been on steroids.  Saw his PCP today and had labs that showed a leukocytosis and was sent to the ED.  Denies any fevers, chest pain, vomiting, diarrhea.   History provided by patient.    Past Medical History:  Diagnosis Date  . BPH (benign prostatic hyperplasia)   . 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 COVID-19   . Depression   . Dyslipidemia   . Dyspnea   . Hypertension   . Interstitial lung disease ([facility])   . Ischemic cardiomyopathy    with left ventricular ejection fraction of 35%  . Myocardial infarction ([facility]) 2010  . Pneumonia   . Sjogren's syndrome [facility])     Past Surgical History:  Procedure Laterality Date  . BRONCHIAL WASHINGS N/A 01/16/2022   Procedure: BRONCHIAL WASHINGS;  Surgeon: Vida Rigger, MD;  Location: [facility] ORS;  Service: Thoracic;  Laterality: N/A;  . CARDIAC CATHETERIZATION  03/2008  . COLONOSCOPY    . CORONARY ARTERY BYPASS GRAFT  03/12/2008  . PROSTATE BIOPSY  11/2009  . RIGID BRONCHOSCOPY N/A 01/16/2022   Procedure: RIGID BRONCHOSCOPY;  Surgeon: Vida Rigger, MD;  Location: [facility] ORS;  Service: Thoracic;  Laterality: N/A;    MEDICATIONS:  Prior to Admission medications   Medication Sig Start Date End Date Taking? Authorizing Provider  albuterol (PROVENTIL)  (2.5 MG/3ML) 0.083% nebulizer solution Take 2.5 mg by nebulization 2 (two) times daily.    [provider]  budesonide (PULMICORT) 0.25 MG/2ML nebulizer solution Take 0.25 mg by nebulization 2 (two) times daily.    [provider]  carvedilol (COREG) 25 MG tablet Take 25 mg by mouth 2 (two) times daily with a meal.    [provider]  clopidogrel (PLAVIX) 75 MG tablet Take 75 mg by mouth daily. 08/20/19   [provider]  DULoxetine (CYMBALTA) 60 MG capsule Take 60 mg by mouth daily. 08/20/19   [provider]  finasteride (PROSCAR) 5 MG tablet Take 5 mg by mouth daily.    [provider]  mycophenolate (CELLCEPT) 500 MG tablet Take 1,500 mg by mouth 2 (two) times daily.    [provider]  simvastatin (ZOCOR) 40 MG tablet Take 40 mg by mouth daily.    [provider]  sulfamethoxazole-trimethoprim (BACTRIM) 400-80 MG tablet Take 1 tablet by mouth 3 (three) times a week. Mon, Wed, Fri    [provider]  tamsulosin (FLOMAX) 0.4 MG CAPS capsule Take 0.4 mg by mouth daily. 09/14/19   [provider]    Physical Exam   Triage Vital Signs: ED Triage Vitals  Enc Vitals Group     BP 02/10/22 1933 107/82     Pulse Rate 02/10/22 1933 90     Resp 02/10/22 1933 15     Temp 02/10/22 1933 98.2  F (36.8 C)     Temp Source 02/10/22 1933 Oral     SpO2 02/10/22 1933 95 %     Weight 02/10/22 1936 185 lb (83.9 kg)     Height 02/10/22 1936 5\' 10"  (1.778 m)     Head Circumference --      Peak Flow --      Pain Score 02/10/22 1935 0     Pain Loc --      Pain Edu? --      Excl. in GC? --     Most recent vital signs: Vitals:   02/10/22 1933  BP: 107/82  Pulse: 90  Resp: 15  Temp: 98.2 F (36.8 C)  SpO2: 95%    CONSTITUTIONAL: Alert and oriented and responds appropriately to questions.  Thin, chronically ill-appearing HEAD: Normocephalic, atraumatic EYES: Conjunctivae clear, pupils appear equal, sclera  nonicteric ENT: normal nose; moist mucous membranes NECK: Supple, normal ROM CARD: RRR; S1 and S2 appreciated; no murmurs, no clicks, no rubs, no gallops RESP: Appears dyspneic but no hypoxia on room air at rest.  Scattered expiratory wheezing.  No rhonchi or rales.  Speaking in short sentences intermittently. ABD/GI: Normal bowel sounds; non-distended; soft, non-tender, no rebound, no guarding, no peritoneal signs BACK: The back appears normal EXT: Normal ROM in all joints; no deformity noted, no edema; no cyanosis, no calf tenderness or calf swelling SKIN: Normal color for age and race; warm; no rash on exposed skin NEURO: Moves all extremities equally, normal speech PSYCH: The patient's mood and manner are appropriate.   ED Results / Procedures / Treatments   LABS: (all labs ordered are listed, but only abnormal results are displayed) Labs Reviewed  CBC WITH DIFFERENTIAL/PLATELET - Abnormal; Notable for the following components:      Result Value   WBC 20.7 (*)    Hemoglobin 12.6 (*)    Platelets 435 (*)    Neutro Abs 17.9 (*)    Monocytes Absolute 1.5 (*)    Abs Immature Granulocytes 0.18 (*)    All other components within normal limits  BASIC METABOLIC PANEL - Abnormal; Notable for the following components:   Sodium 134 (*)    Glucose, Bld 123 (*)    All other components within normal limits  RESP PANEL BY RT-PCR (FLU A&B, COVID) ARPGX2  CULTURE, BLOOD (ROUTINE X 2)  CULTURE, BLOOD (ROUTINE X 2)  LACTIC ACID, PLASMA  PROCALCITONIN  URINALYSIS, ROUTINE W REFLEX MICROSCOPIC  TROPONIN I (HIGH SENSITIVITY)  TROPONIN I (HIGH SENSITIVITY)     EKG:  EKG Interpretation  Date/Time:    Ventricular Rate:    PR Interval:    QRS Duration:   QT Interval:    QTC Calculation:   R Axis:     Text Interpretation:           RADIOLOGY: My personal review and interpretation of imaging: Chest x-ray concerning for worsening infiltrates.  I have personally reviewed all  radiology reports.   DG Chest Port 1 View  Result Date: 02/10/2022 CLINICAL DATA:  Dyspnea EXAM: PORTABLE CHEST 1 VIEW COMPARISON:  12/05/2019, CT 12/03/2021 FINDINGS: Coarse bilateral asymmetric reticulonodular infiltrates, more prevalent within the lung apices and right lung base are identified, progressive since both prior examinations, and compatible with progressive chronic atypical infection or acute on chronic infection in the appropriate clinical setting. No pneumothorax or pleural effusion. Coronary artery bypass grafting has been performed. Cardiac size within normal limits. Pulmonary vascularity is normal. No acute bone abnormality. IMPRESSION:  1. Progressive bilateral asymmetric reticulonodular infiltrates, compatible with progressive chronic atypical infection or acute on chronic infection in the appropriate clinical setting. Electronically Signed   By: Helyn Numbers M.D.   On: 02/10/2022 20:14     PROCEDURES:  Critical Care performed: No     .1-3 Lead EKG Interpretation  Performed by: Adeli Frost, Layla Maw, DO Authorized by: Gerardo Caiazzo, Layla Maw, DO     Interpretation: normal     ECG rate:  90   ECG rate assessment: normal     Rhythm: sinus rhythm     Ectopy: none     Conduction: normal       IMPRESSION / MDM / ASSESSMENT AND PLAN / ED COURSE  I reviewed the triage vital signs and the nursing notes.    ***  The patient is on the cardiac monitor to evaluate for evidence of arrhythmia and/or significant heart rate changes.   DIFFERENTIAL DIAGNOSIS (includes but not limited to):   ***   Patient's presentation is most consistent with {EM COPA:27473}   PLAN: ***   MEDICATIONS GIVEN IN ED: Medications  ceFEPIme (MAXIPIME) 2 g in sodium chloride 0.9 % 100 mL IVPB (2 g Intravenous New Bag/Given 02/10/22 2356)  vancomycin (VANCOREADY) IVPB 2000 mg/400 mL (has no administration in time range)  ipratropium-albuterol (DUONEB) 0.5-2.5 (3) MG/3ML nebulizer solution 3 mL (3 mLs  Nebulization Given 02/10/22 2356)  benzonatate (TESSALON) capsule 100 mg (100 mg Oral Given 02/10/22 2355)     ED COURSE:  ***   CONSULTS:  ***   OUTSIDE RECORDS REVIEWED:  ***       FINAL CLINICAL IMPRESSION(S) / ED DIAGNOSES   Final diagnoses:  HCAP (healthcare-associated pneumonia)     Rx / DC Orders   ED Discharge Orders     None        Note:  This document was prepared using Dragon voice recognition software and may include unintentional dictation errors.

## 2022-02-11 NOTE — Assessment & Plan Note (Signed)
-   We will continue Proscar and Flomax. ?

## 2022-02-12 ENCOUNTER — Other Ambulatory Visit (HOSPITAL_COMMUNITY): Payer: Self-pay

## 2022-02-12 DIAGNOSIS — J189 Pneumonia, unspecified organism: Secondary | ICD-10-CM | POA: Diagnosis not present

## 2022-02-12 DIAGNOSIS — A419 Sepsis, unspecified organism: Secondary | ICD-10-CM | POA: Diagnosis not present

## 2022-02-12 LAB — CBC WITH DIFFERENTIAL/PLATELET
Abs Immature Granulocytes: 0.11 10*3/uL — ABNORMAL HIGH (ref 0.00–0.07)
Basophils Absolute: 0.1 10*3/uL (ref 0.0–0.1)
Basophils Relative: 0 %
Eosinophils Absolute: 0.2 10*3/uL (ref 0.0–0.5)
Eosinophils Relative: 1 %
HCT: 33.1 % — ABNORMAL LOW (ref 39.0–52.0)
Hemoglobin: 10.3 g/dL — ABNORMAL LOW (ref 13.0–17.0)
Immature Granulocytes: 1 %
Lymphocytes Relative: 10 %
Lymphs Abs: 1.2 10*3/uL (ref 0.7–4.0)
MCH: 28.1 pg (ref 26.0–34.0)
MCHC: 31.1 g/dL (ref 30.0–36.0)
MCV: 90.4 fL (ref 80.0–100.0)
Monocytes Absolute: 1.2 10*3/uL — ABNORMAL HIGH (ref 0.1–1.0)
Monocytes Relative: 10 %
Neutro Abs: 9 10*3/uL — ABNORMAL HIGH (ref 1.7–7.7)
Neutrophils Relative %: 78 %
Platelets: 303 10*3/uL (ref 150–400)
RBC: 3.66 MIL/uL — ABNORMAL LOW (ref 4.22–5.81)
RDW: 12.8 % (ref 11.5–15.5)
WBC: 11.7 10*3/uL — ABNORMAL HIGH (ref 4.0–10.5)
nRBC: 0 % (ref 0.0–0.2)

## 2022-02-12 LAB — CREATININE, SERUM
Creatinine, Ser: 0.77 mg/dL (ref 0.61–1.24)
GFR, Estimated: >60 mL/min (ref >60)

## 2022-02-12 LAB — LEGIONELLA PNEUMOPHILA SEROGP 1 UR AG: L. pneumophila Serogp 1 Ur Ag: NEGATIVE

## 2022-02-12 MED ORDER — IPRATROPIUM-ALBUTEROL 0.5-2.5 (3) MG/3ML IN SOLN
3.0000 mL | Freq: Three times a day (TID) | RESPIRATORY_TRACT | Status: DC
  Administered 2022-02-12 – 2022-02-13 (×4): 3 mL via RESPIRATORY_TRACT
  Filled 2022-02-12 (×4): qty 3

## 2022-02-12 MED ORDER — ADULT MULTIVITAMIN W/MINERALS CH
1.0000 | ORAL_TABLET | Freq: Every day | ORAL | Status: DC
  Administered 2022-02-12: 1 via ORAL
  Filled 2022-02-12: qty 1

## 2022-02-12 NOTE — Care Management Important Message (Signed)
Important Message  Patient Details  Name: Nicholas Alvarez MRN: [id number] Date of Birth: 07-21-1940   Medicare Important Message Given:  N/A - LOS <3 / Initial given by admissions     [name] 02/12/2022, 3:10 PM

## 2022-02-12 NOTE — TOC Progression Note (Signed)
Transition of Care [facility]) - Progression Note    Patient Details  Name: Nicholas Alvarez MRN: [id number] Date of Birth: 20-Jan-1941  Transition of [facility]) CM/SW Contact  Marlowe Sax, RN Phone Number: 02/12/2022, 4:29 PM  Clinical Narrative:    Pulmonology Is consulting with the patient Bloodwork is improved with less leukocytosis. Would estimate dc home in 3 days per MD notes TOC will continue to follow and assist with needs        Expected Discharge Plan and Services                                                 Social Determinants of Health (SDOH) Interventions    Readmission Risk Interventions     No data to display

## 2022-02-12 NOTE — TOC Benefit Eligibility Note (Signed)
Patient Product/process development scientist completed.    The patient is currently admitted and upon discharge could be taking linezolid (Zyvox) 600 mg tablets.  The current 10 day co-pay is $100.00.   The patient is insured through [employer]   Roland Earl, CPHT Pharmacy Patient Advocate Specialist [facility] Patient Advocate Team Direct Number: [phone]  Fax: [phone]

## 2022-02-12 NOTE — Plan of Care (Signed)
  Problem: Activity: Goal: Ability to tolerate increased activity will improve Outcome: Progressing   Problem: Clinical Measurements: Goal: Ability to maintain a body temperature in the normal range will improve Outcome: Progressing   Problem: Respiratory: Goal: Ability to maintain adequate ventilation will improve Outcome: Progressing Goal: Ability to maintain a clear airway will improve Outcome: Progressing   Problem: Fluid Volume: Goal: Hemodynamic stability will improve Outcome: Progressing   Problem: Clinical Measurements: Goal: Diagnostic test results will improve Outcome: Progressing Goal: Signs and symptoms of infection will decrease Outcome: Progressing   Problem: Respiratory: Goal: Ability to maintain adequate ventilation will improve Outcome: Progressing   Problem: Education: Goal: Knowledge of General Education information will improve Description: Including pain rating scale, medication(s)/side effects and non-pharmacologic comfort measures Outcome: Progressing   Problem: Health Behavior/Discharge Planning: Goal: Ability to manage health-related needs will improve Outcome: Progressing   Problem: Clinical Measurements: Goal: Ability to maintain clinical measurements within normal limits will improve Outcome: Progressing Goal: Will remain free from infection Outcome: Progressing Goal: Diagnostic test results will improve Outcome: Progressing Goal: Respiratory complications will improve Outcome: Progressing Goal: Cardiovascular complication will be avoided Outcome: Progressing   Problem: Activity: Goal: Risk for activity intolerance will decrease Outcome: Progressing   Problem: Nutrition: Goal: Adequate nutrition will be maintained Outcome: Progressing   Problem: Coping: Goal: Level of anxiety will decrease Outcome: Progressing   Problem: Elimination: Goal: Will not experience complications related to bowel motility Outcome: Progressing Goal:  Will not experience complications related to urinary retention Outcome: Progressing   Problem: Pain Managment: Goal: General experience of comfort will improve Outcome: Progressing   Problem: Safety: Goal: Ability to remain free from injury will improve Outcome: Progressing   Problem: Skin Integrity: Goal: Risk for impaired skin integrity will decrease Outcome: Progressing   

## 2022-02-12 NOTE — Progress Notes (Addendum)
Initial Nutrition Assessment  DOCUMENTATION CODES:   Not applicable  INTERVENTION:   -Continue liberalized diet of regular -MVI with minerals daily -Pt refused further offer of oral nutrition supplements  NUTRITION DIAGNOSIS:   Increased nutrient needs related to chronic illness (ILD) as evidenced by estimated needs.  GOAL:   Patient will meet greater than or equal to 90% of their needs  MONITOR:   PO intake, Supplement acceptance  REASON FOR ASSESSMENT:   Malnutrition Screening Tool    ASSESSMENT:   Pt with medical history significant for BPH, coronary artery disease, depression, dyslipidemia, hypertension, interstitial lung disease, bronchiectasis, ischemic cardiomyopathy, Sjogren's syndrome, who presented to the hospital with worsening productive cough, shortness of breath and wheezing  Pt admitted with sepsis secondary to recurrent pneumonia.   Reviewed I/O's: -311 ml x 24 hours and +241 ml since admission  UOP: 1.3 L x 24 hours  Spoke with pt at bedside, who reports variable intake over the past year secondary to COVID. Pt shares that he had taste changes and decreased sense of taste and smell during COVID, but this has resolved. Pt generally consumes 3 meals per day PTA, which consists of a meat, starch, and vegetable. Pt shares he was not eating well during hospitalization, as he was on a heart healthy diet and food was too bland to eat.   Pt endorses wt loss, but unsure of UBW or amount of weight lost. Reviewed wt hx; pt has experienced a 2.1% wt loss over the past month, which is not significant for time frame.   Discussed importance of good meal and supplement intake to promote healing. Pt declines offer of nutritional supplements stating "I just don't want to chance it". RD ordered dinner meal per his request.   Medications reviewed.   Lab Results  Component Value Date   HGBA1C  03/07/2008    5.9 (NOTE)   The ADA recommends the following therapeutic goal  for glycemic   control related to Hgb A1C measurement:   Goal of Therapy:   < 7.0% Hgb A1C   Reference: American Diabetes Association: Clinical Practice   Recommendations 2008, Diabetes Care,  2008, 31:(Suppl 1).   PTA DM medications are none.   Labs reviewed: Mg: 2.6, CBGS: 122 (inpatient orders for glycemic control are none).    NUTRITION - FOCUSED PHYSICAL EXAM:  Flowsheet Row Most Recent Value  Orbital Region Mild depletion  Upper Arm Region No depletion  Buccal Region Mild depletion  Temple Region Mild depletion  Clavicle Bone Region No depletion  Clavicle and Acromion Bone Region No depletion  Scapular Bone Region No depletion  Dorsal Hand No depletion  Patellar Region No depletion  Anterior Thigh Region No depletion  Posterior Calf Region No depletion  Edema (RD Assessment) Mild  Hair Reviewed  Eyes Reviewed  Mouth Reviewed  Skin Reviewed  Nails Reviewed       Diet Order:   Diet Order             Diet regular Room service appropriate? Yes; Fluid consistency: Thin; Fluid restriction: Other (see comments)  Diet effective now                   EDUCATION NEEDS:   Education needs have been addressed  Skin:  Skin Assessment: Reviewed RN Assessment  Last BM:  02/06/22  Height:   Ht Readings from Last 1 Encounters:  02/10/22 5\' 10"  (1.778 m)    Weight:   Wt Readings from Last 1 Encounters:  02/10/22 83.9 kg    Ideal Body Weight:  75.5 kg  BMI:  Body mass index is 26.54 kg/m.  Estimated Nutritional Needs:   Kcal:  2050-2250  Protein:  115-130 grams  Fluid:  > 2 L    Levada Schilling, RD, LDN, CDCES Registered Dietitian II Certified Diabetes Care and Education Specialist Please refer to [facility] for RD and/or RD on-call/weekend/after hours pager

## 2022-02-12 NOTE — Progress Notes (Signed)
Progress Note    Nicholas Alvarez  ZSW:[id number] DOB: [birth year]  DOA: 02/10/2022 PCP: Ottie Glazier, MD      Brief Narrative:    Medical records reviewed and are as summarized below:  Nicholas Alvarez is a 81 y.o. male with medical history significant for BPH, coronary artery disease, depression, dyslipidemia, hypertension, interstitial lung disease, bronchiectasis, ischemic cardiomyopathy, Sjogren's syndrome, who presented to the hospital with worsening productive cough, shortness of breath and wheezing.  He also complained of generalized weakness and poor appetite.  He had bronchoscopy with bronchial washing from 01/16/2022 bronchoalveolar lavage was positive for MRSA.  He had been on oral Bactrim until about a week prior to admission.   He was admitted to the hospital for sepsis secondary to recurrent pneumonia.  He was treated with empiric IV antibiotics.     Assessment/Plan:   Principal Problem:   Sepsis due to pneumonia [facility]) Active Problems:   Bronchiectasis ([place])   Dyslipidemia   Essential hypertension   BPH (benign prostatic hyperplasia)   Depression    Body mass index is 26.54 kg/m.   Secondary to recurrent pneumonia probably due to MRSA pneumonia, immunocompromise state: Continue IV vancomycin.  Discontinue azithromycin and cefepime per pulmonologist recommendation.  Follow-up blood cultures.  Of note, MRSA was resistant to Bactrim.  Interstitial lung disease bronchiectasis secondary to Sjogren's syndrome: Continue CellCept  CAD, hypertension: Continue Plavix, simvastatin, metoprolol  Other comorbidities include BPH, depression   Diet Order             Diet regular Room service appropriate? Yes; Fluid consistency: Thin; Fluid restriction: Other (see comments)  Diet effective now                            Consultants: Pulmonologist  Procedures: None    Medications:    budesonide  0.25 mg Nebulization BID   buPROPion  150  mg Oral Daily   carvedilol  25 mg Oral BID WC   clopidogrel  75 mg Oral Daily   DULoxetine  60 mg Oral Daily   enoxaparin (LOVENOX) injection  40 mg Subcutaneous Q24H   finasteride  5 mg Oral Daily   guaiFENesin  600 mg Oral BID   ipratropium-albuterol  3 mL Nebulization TID   mycophenolate  1,500 mg Oral BID   simvastatin  40 mg Oral Daily   sulfamethoxazole-trimethoprim  1 tablet Oral Once per day on Mon Wed Fri   tamsulosin  0.4 mg Oral Daily   Continuous Infusions:  azithromycin 500 mg (02/12/22 0215)   ceFEPime (MAXIPIME) IV Stopped (02/12/22 0854)   vancomycin 1,000 mg (02/12/22 0951)     Anti-infectives (From admission, onward)    Start     Dose/Rate Route Frequency Ordered Stop   02/11/22 1300  vancomycin (VANCOCIN) IVPB 1000 mg/200 mL premix        1,000 mg 200 mL/hr over 60 Minutes Intravenous Every 12 hours 02/11/22 1213     02/11/22 0900  sulfamethoxazole-trimethoprim (BACTRIM) 400-80 MG per tablet 1 tablet       Note to Pharmacy: Mon, Wed, Fri     1 tablet Oral Once per day on Mon Wed Fri 02/11/22 0244     02/11/22 0800  cefTRIAXone (ROCEPHIN) 2 g in sodium chloride 0.9 % 100 mL IVPB  Status:  Discontinued        2 g 200 mL/hr over 30 Minutes Intravenous Every 24 hours 02/11/22 0026  02/11/22 0131   02/11/22 0800  ceFEPIme (MAXIPIME) 2 g in sodium chloride 0.9 % 100 mL IVPB        2 g 200 mL/hr over 30 Minutes Intravenous Every 8 hours 02/11/22 0136     02/11/22 0030  azithromycin (ZITHROMAX) 500 mg in sodium chloride 0.9 % 250 mL IVPB        500 mg 250 mL/hr over 60 Minutes Intravenous Every 24 hours 02/11/22 0026 02/16/22 0029   02/11/22 0000  ceFEPIme (MAXIPIME) 2 g in sodium chloride 0.9 % 100 mL IVPB        2 g 200 mL/hr over 30 Minutes Intravenous  Once 02/10/22 2345 02/11/22 0033   02/11/22 0000  vancomycin (VANCOREADY) IVPB 2000 mg/400 mL        2,000 mg 200 mL/hr over 120 Minutes Intravenous  Once 02/10/22 2345 02/11/22 0236               Family Communication/Anticipated D/C date and plan/Code Status   DVT prophylaxis: enoxaparin (LOVENOX) injection 40 mg Start: 02/11/22 0800     Code Status: Full Code  Family Communication: None Disposition Plan: Plan to discharge home with 2 to 3 days   Status is: Inpatient Remains inpatient appropriate because: IV antibiotics       Subjective:   Interval events noted.  He complains of shortness of breath with minimal activity.  He still has a cough.  No chest pain or fever.  Objective:    Vitals:   02/11/22 1630 02/11/22 2012 02/11/22 2026 02/12/22 0822  BP: 122/74 99/69  (!) 142/77  Pulse: 73 67  68  Resp: _0 Temp: (!) 97.4 F (36.3 C) 98 F (36.7 C)  97.6 F (36.4 C)  TempSrc: Tympanic     SpO2: 94% 95% 94% 100%  Weight:      Height:       No data found.   Intake/Output Summary (Last 24 hours) at 02/12/2022 1320 Last data filed at 02/12/2022 1033 Gross per 24 hour  Intake 1039.18 ml  Output 1690 ml  Net -650.82 ml   Filed Weights   02/10/22 1936  Weight: 83.9 kg    Exam:  GEN: NAD SKIN: No rash EYES: EOMI ENT: MMM CV: RRR PULM: Coarse rhonchi bilaterally ABD: soft, ND, NT, +BS CNS: AAO x 3, non focal EXT: No edema or tenderness        Data Reviewed:   I have personally reviewed following labs and imaging studies:  Labs: Labs show the following:   Basic Metabolic Panel: Recent Labs  Lab 02/10/22 1952 02/11/22 0447 02/12/22 0438  NA 134* 137  --   K 3.9 3.6  --   CL 101 108  --   CO2 24 21*  --   GLUCOSE 123* 89  --   BUN 20 19  --   CREATININE 0.97 0.80 0.77  CALCIUM 9.8 8.8*  --    GFR Estimated Creatinine Clearance: 77.3 mL/min (by C-G formula based on SCr of 0.77 mg/dL). Liver Function Tests: Recent Labs  Lab 02/11/22 0447  AST 11*  ALT 7  ALKPHOS 54  BILITOT 0.8  PROT 7.0  ALBUMIN 2.5*   No results for input(s): "LIPASE", "AMYLASE" in the last 168 hours. No results for  input(s): "AMMONIA" in the last 168 hours. Coagulation profile Recent Labs  Lab 02/11/22 0447  INR 1.2    CBC: Recent Labs  Lab 02/10/22 1952 02/12/22 0438  WBC 20.7*  11.7*  NEUTROABS 17.9* 9.0*  HGB 12.6* 10.3*  HCT 40.5 33.1*  MCV 91.2 90.4  PLT 435* 303   Cardiac Enzymes: No results for input(s): "CKTOTAL", "CKMB", "CKMBINDEX", "TROPONINI" in the last 168 hours. BNP (last 3 results) No results for input(s): "PROBNP" in the last 8760 hours. CBG: No results for input(s): "GLUCAP" in the last 168 hours. D-Dimer: No results for input(s): "DDIMER" in the last 72 hours. Hgb A1c: No results for input(s): "HGBA1C" in the last 72 hours. Lipid Profile: No results for input(s): "CHOL", "HDL", "LDLCALC", "TRIG", "CHOLHDL", "LDLDIRECT" in the last 72 hours. Thyroid function studies: No results for input(s): "TSH", "T4TOTAL", "T3FREE", "THYROIDAB" in the last 72 hours.  Invalid input(s): "FREET3" Anemia work up: No results for input(s): "VITAMINB12", "FOLATE", "FERRITIN", "TIBC", "IRON", "RETICCTPCT" in the last 72 hours. Sepsis Labs: Recent Labs  Lab 02/10/22 1952 02/10/22 2342 02/11/22 0447 02/11/22 0806 02/12/22 0438  PROCALCITON  --  <0.10  --   --   --   WBC 20.7*  --   --   --  11.7*  LATICACIDVEN 1.9  --  1.3 1.2  --     Microbiology Recent Results (from the past 240 hour(s))  Resp Panel by RT-PCR (Flu A&B, Covid) Anterior Nasal Swab     Status: None   Collection Time: 02/10/22 11:42 PM   Specimen: Anterior Nasal Swab  Result Value Ref Range Status   SARS Coronavirus 2 by RT PCR NEGATIVE NEGATIVE Final    Comment: (NOTE) SARS-CoV-2 target nucleic acids are NOT DETECTED.  The SARS-CoV-2 RNA is generally detectable in upper respiratory specimens during the acute phase of infection. The lowest concentration of SARS-CoV-2 viral copies this assay can detect is 138 copies/mL. A negative result does not preclude SARS-Cov-2 infection and should not be used as the  sole basis for treatment or other patient management decisions. A negative result may occur with  improper specimen collection/handling, submission of specimen other than nasopharyngeal swab, presence of viral mutation(s) within the areas targeted by this assay, and inadequate number of viral copies(<138 copies/mL). A negative result must be combined with clinical observations, patient history, and epidemiological information. The expected result is Negative.  Fact Sheet for Patients:  EntrepreneurPulse.com.au  Fact Sheet for Healthcare Providers:  IncredibleEmployment.be  This test is no t yet approved or cleared by the [place] FDA and  has been authorized for detection and/or diagnosis of SARS-CoV-2 by FDA under an Emergency Use Authorization (EUA). This EUA will remain  in effect (meaning this test can be used) for the duration of the COVID-19 declaration under Section 564(b)(1) of the Act, 21 U.S.C.section 360bbb-3(b)(1), unless the authorization is terminated  or revoked sooner.       Influenza A by PCR NEGATIVE NEGATIVE Final   Influenza B by PCR NEGATIVE NEGATIVE Final    Comment: (NOTE) The Xpert Xpress SARS-CoV-2/FLU/RSV plus assay is intended as an aid in the diagnosis of influenza from Nasopharyngeal swab specimens and should not be used as a sole basis for treatment. Nasal washings and aspirates are unacceptable for Xpert Xpress SARS-CoV-2/FLU/RSV testing.  Fact Sheet for Patients: EntrepreneurPulse.com.au  Fact Sheet for Healthcare Providers: IncredibleEmployment.be  This test is not yet approved or cleared by the [place] FDA and has been authorized for detection and/or diagnosis of SARS-CoV-2 by FDA under an Emergency Use Authorization (EUA). This EUA will remain in effect (meaning this test can be used) for the duration of the COVID-19 declaration under Section 564(b)(1) of  the  Act, 21 U.S.C. section 360bbb-3(b)(1), unless the authorization is terminated or revoked.  Performed at [facility], [address] [place], [place] [address]   Blood culture (routine x 2)     Status: None (Preliminary result)   Collection Time: 02/10/22 11:42 PM   Specimen: BLOOD  Result Value Ref Range Status   Specimen Description BLOOD BLOOD RIGHT WRIST  Final   Special Requests   Final    Blood Culture results may not be optimal due to an inadequate volume of blood received in culture bottles   Culture   Final    NO GROWTH 1 DAY Performed at [facility], [address] [place], [place] [address]    Report Status PENDING  Incomplete  Blood culture (routine x 2)     Status: None (Preliminary result)   Collection Time: 02/10/22 11:42 PM   Specimen: BLOOD  Result Value Ref Range Status   Specimen Description BLOOD BLOOD LEFT WRIST  Final   Special Requests   Final    Blood Culture results may not be optimal due to an inadequate volume of blood received in culture bottles   Culture   Final    NO GROWTH 1 DAY Performed at [facility], [address], [place], [place] [address]    Report Status PENDING  Incomplete  MRSA Next Gen by PCR, Nasal     Status: Abnormal   Collection Time: 02/11/22  1:08 AM   Specimen: Nasal Mucosa; Nasal Swab  Result Value Ref Range Status   MRSA by PCR Next Gen DETECTED (A) NOT DETECTED Final    Comment: RESULT CALLED TO, READ BACK BY AND VERIFIED WITH: Marcelyn Ditty 02/11/2022 AT 49 SRR (NOTE) The GeneXpert MRSA Assay (FDA approved for NASAL specimens only), is one component of a comprehensive MRSA colonization surveillance program. It is not intended to diagnose MRSA infection nor to guide or monitor treatment for MRSA infections. Test performance is not FDA approved in patients less than 66 years old. Performed at [facility], [address] [place], [place] [address]     Procedures and  diagnostic studies:  DG Chest [facility] 1 View  Result Date: 02/10/2022 CLINICAL DATA:  Dyspnea EXAM: PORTABLE CHEST 1 VIEW COMPARISON:  12/05/2019, CT 12/03/2021 FINDINGS: Coarse bilateral asymmetric reticulonodular infiltrates, more prevalent within the lung apices and right lung base are identified, progressive since both prior examinations, and compatible with progressive chronic atypical infection or acute on chronic infection in the appropriate clinical setting. No pneumothorax or pleural effusion. Coronary artery bypass grafting has been performed. Cardiac size within normal limits. Pulmonary vascularity is normal. No acute bone abnormality. IMPRESSION: 1. Progressive bilateral asymmetric reticulonodular infiltrates, compatible with progressive chronic atypical infection or acute on chronic infection in the appropriate clinical setting. Electronically Signed   By: Fidela Salisbury M.D.   On: 02/10/2022 20:14               LOS: 1 day   Eufelia Veno  [facility]   Pager on [email or web address]. If 7PM-7AM, please contact night-coverage at [email or web address]     02/12/2022, 1:20 PM

## 2022-02-12 NOTE — Plan of Care (Signed)
  Problem: Clinical Measurements: Goal: Signs and symptoms of infection will decrease Outcome: Progressing   Problem: Education: Goal: Knowledge of General Education information will improve Description: Including pain rating scale, medication(s)/side effects and non-pharmacologic comfort measures Outcome: Progressing   Problem: Health Behavior/Discharge Planning: Goal: Ability to manage health-related needs will improve Outcome: Progressing   Problem: Clinical Measurements: Goal: Will remain free from infection Outcome: Progressing   Problem: Nutrition: Goal: Adequate nutrition will be maintained Outcome: Progressing

## 2022-02-12 NOTE — Progress Notes (Signed)
PULMONOLOGY         Date: 02/12/2022,   MRN# [id number] Nicholas Alvarez 09-11-1940     AdmissionWeight: 83.9 kg                 CurrentWeight: 83.9 kg  Referring provider: Dr Mal Misty   CHIEF COMPLAINT:    Multifocal pneumonia with failure of outpatient therapy.    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  Most recently has developed a episode of recurrent pneumonia with with failure of outpatient antibiotic therapy.  He has had copious amounts of phlegm with forceful cough that is severe in intensity and lasting day and night.  He has been progressively more weak and has also lost weight due to diminished appetite.  Since he has had numerous courses of antibiotics we proceeded with bronchoscopic airway inspection and noted multiple areas with mucous plugging which were evacuated.  He had bronchoalveolar lavage invasive respiratory culture performed which is returned with MRSA.  Today he is status post IV vancomycin and reports slight improvement clinically with less chest discomfort.  His wife is at bedside during my evaluation and reports that he has been progressively getting weaker and she is very thankful for medical care in the hospital.  Patient is now without labored breathing and appears to be more stable.  PCCM consultation for additional evaluation management of this complex respiratory patient.   02/12/22- patient was seen and examined at bedside. He seems to be improved. Wife at bedside we reviewed hospital findings.  Bloodwork is improved with less leukocytosis. Would estimate dc home in 3 days  PAST MEDICAL HISTORY   Past Medical History:  Diagnosis Date   BPH (benign prostatic hyperplasia)    CAD (coronary artery disease)    Post anterior wall myocardial infarction and  5-vessel coronary bypass    COVID-19    Depression    Dyslipidemia    Dyspnea    Hypertension    Interstitial lung disease ([place])    Ischemic cardiomyopathy    with left ventricular ejection fraction of 35%   Myocardial infarction ([place]) 2010   Pneumonia    Sjogren's syndrome ([place])      SURGICAL HISTORY   Past Surgical History:  Procedure Laterality Date   BRONCHIAL WASHINGS N/A 01/16/2022   Procedure: BRONCHIAL WASHINGS;  Surgeon: Ottie Glazier, MD;  Location: [facility] ORS;  Service: Thoracic;  Laterality: N/A;   CARDIAC CATHETERIZATION  03/2008   COLONOSCOPY     CORONARY ARTERY BYPASS GRAFT  03/12/2008   PROSTATE BIOPSY  11/2009   RIGID BRONCHOSCOPY N/A 01/16/2022   Procedure: RIGID BRONCHOSCOPY;  Surgeon: Ottie Glazier, MD;  Location: [facility] ORS;  Service: Thoracic;  Laterality: N/A;     FAMILY HISTORY   Family History  Problem Relation Age of Onset   Coronary artery disease Other    Heart attack Sister 2   Heart attack Maternal Grandfather 73   Heart attack Paternal Grandfather 78   COPD Mother    Pneumonia Father    Cancer Sister      SOCIAL HISTORY   Social History   Tobacco Use   Smoking status: Former   Smokeless tobacco: Never   Tobacco comments:    Quit around age 34  Vaping Use   Vaping Use: Never used  Substance Use Topics   Alcohol  use: Yes    Alcohol/week: 12.0 standard drinks of alcohol    Types: 12 Shots of liquor per week   Drug use: No     MEDICATIONS    Home Medication:     Current Medication:  Current Facility-Administered Medications:    acetaminophen (TYLENOL) tablet 650 mg, 650 mg, Oral, Q6H PRN **OR** acetaminophen (TYLENOL) suppository 650 mg, 650 mg, Rectal, Q6H PRN, Mansy, Jan A, MD   acetylcysteine (MUCOMYST) 20 % nebulizer / oral solution 4 mL, 4 mL, Nebulization, Daily PRN, Mansy, Jan A, MD   azithromycin (ZITHROMAX) 500 mg in sodium chloride 0.9 % 250 mL IVPB, 500 mg, Intravenous, Q24H, Mansy, Jan A, MD, Last Rate:  250 mL/hr at 02/12/22 0215, 500 mg at 02/12/22 0215   budesonide (PULMICORT) nebulizer solution 0.25 mg, 0.25 mg, Nebulization, BID, Mansy, Jan A, MD, 0.25 mg at 02/12/22 0813   buPROPion (WELLBUTRIN XL) 24 hr tablet 150 mg, 150 mg, Oral, Daily, Mansy, Jan A, MD, 150 mg at 02/12/22 0951   carvedilol (COREG) tablet 25 mg, 25 mg, Oral, BID WC, Mansy, Jan A, MD, 25 mg at 02/12/22 0825   ceFEPIme (MAXIPIME) 2 g in sodium chloride 0.9 % 100 mL IVPB, 2 g, Intravenous, Q8H, Mansy, Jan A, MD, Stopped at 02/12/22 0854   chlorpheniramine-HYDROcodone (TUSSIONEX) 10-8 MG/5ML suspension 5 mL, 5 mL, Oral, Q12H PRN, Mansy, Jan A, MD   clopidogrel (PLAVIX) tablet 75 mg, 75 mg, Oral, Daily, Mansy, Jan A, MD, 75 mg at 02/12/22 0951   DULoxetine (CYMBALTA) DR capsule 60 mg, 60 mg, Oral, Daily, Mansy, Jan A, MD, 60 mg at 02/12/22 0951   enoxaparin (LOVENOX) injection 40 mg, 40 mg, Subcutaneous, Q24H, Mansy, Jan A, MD, 40 mg at 02/12/22 0825   finasteride (PROSCAR) tablet 5 mg, 5 mg, Oral, Daily, Mansy, Jan A, MD, 5 mg at 02/12/22 0951   guaiFENesin (MUCINEX) 12 hr tablet 600 mg, 600 mg, Oral, BID, Mansy, Jan A, MD, 600 mg at 02/12/22 0951   ipratropium-albuterol (DUONEB) 0.5-2.5 (3) MG/3ML nebulizer solution 3 mL, 3 mL, Nebulization, TID, Jennye Boroughs, MD   magnesium hydroxide (MILK OF MAGNESIA) suspension 30 mL, 30 mL, Oral, Daily PRN, Mansy, Jan A, MD   mycophenolate (CELLCEPT) capsule 1,500 mg, 1,500 mg, Oral, BID, Mansy, Jan A, MD, 1,500 mg at 02/12/22 0953   ondansetron (ZOFRAN) tablet 4 mg, 4 mg, Oral, Q6H PRN **OR** ondansetron (ZOFRAN) injection 4 mg, 4 mg, Intravenous, Q6H PRN, Mansy, Jan A, MD   simvastatin (ZOCOR) tablet 40 mg, 40 mg, Oral, Daily, Mansy, Jan A, MD, 40 mg at 02/12/22 0951   sulfamethoxazole-trimethoprim (BACTRIM) 400-80 MG per tablet 1 tablet, 1 tablet, Oral, Once per day on Mon Wed Fri, Mansy, Jan A, MD, 1 tablet at 02/11/22 0805   tamsulosin (FLOMAX) capsule 0.4 mg, 0.4 mg, Oral, Daily,  Mansy, Jan A, MD, 0.4 mg at 02/12/22 1937   traZODone (DESYREL) tablet 25 mg, 25 mg, Oral, QHS PRN, Mansy, Jan A, MD   vancomycin (VANCOCIN) IVPB 1000 mg/200 mL premix, 1,000 mg, Intravenous, Q12H, Berton Mount, RPH, Last Rate: 200 mL/hr at 02/12/22 0951, 1,000 mg at 02/12/22 0951    ALLERGIES   Ace inhibitors     REVIEW OF SYSTEMS    Review of Systems:  Gen:  Denies  fever, sweats, chills weigh loss  HEENT: Denies blurred vision, double vision, ear pain, eye pain, hearing loss, nose bleeds, sore throat Cardiac:  No dizziness, chest pain or heaviness, chest tightness,edema Resp:  reports dyspnea chronically  Gi: Denies swallowing difficulty, stomach pain, nausea or vomiting, diarrhea, constipation, bowel incontinence Gu:  Denies bladder incontinence, burning urine Ext:   Denies Joint pain, stiffness or swelling Skin: Denies  skin rash, easy bruising or bleeding or hives Endoc:  Denies polyuria, polydipsia , polyphagia or weight change Psych:   Denies depression, insomnia or hallucinations   Other:  All other systems negative   VS: BP (!) 142/77 (BP Location: Right Arm)   Pulse 68   Temp 97.6 F (36.4 C)   Resp 16   Ht _0  (1.778 m)   Wt 83.9 kg   SpO2 100%   BMI 26.54 kg/m      PHYSICAL EXAM    GENERAL:NAD, no fevers, chills, no weakness no fatigue HEAD: Normocephalic, atraumatic.  EYES: Pupils equal, round, reactive to light. Extraocular muscles intact. No scleral icterus.  MOUTH: Moist mucosal membrane. Dentition intact. No abscess noted.  EAR, NOSE, THROAT: Clear without exudates. No external lesions.  NECK: Supple. No thyromegaly. No nodules. No JVD.  PULMONARY: decreased breath sounds with mild rhonchi worse at bases bilaterally.  CARDIOVASCULAR: S1 and S2. Regular rate and rhythm. No murmurs, rubs, or gallops. No edema. Pedal pulses 2+ bilaterally.  GASTROINTESTINAL: Soft, nontender, nondistended. No masses. Positive bowel sounds. No  hepatosplenomegaly.  MUSCULOSKELETAL: No swelling, clubbing, or edema. Range of motion full in all extremities.  NEUROLOGIC: Cranial nerves II through XII are intact. No gross focal neurological deficits. Sensation intact. Reflexes intact.  SKIN: No ulceration, lesions, rashes, or cyanosis. Skin warm and dry. Turgor intact.  PSYCHIATRIC: Mood, affect within normal limits. The patient is awake, alert and oriented x 3. Insight, judgment intact.       IMAGING   Reviewed CT chest 12/05/2021 with multifocal pneumonia and chest x-ray with similar  ASSESSMENT/PLAN   Recurrent pneumonia  -Status post bronchoscopy with bronchoalveolar lavage growing MRSA -Patient has not had treatment for MRSA and has been on Bactrim and outpatient which seems to be resistant per sensitivity panel -Agree with current antimicrobials including Zithromax, cefepime and vancomycin-I would be okay with narrowing to vancomycin only   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  -Continue CellCept 1500 mg twice daily  Coronary artery disease -Continue home regimen -Coreg -Plavix  Chronic bronchiectasis  -Mucomyst 20% nebulized 4 mL twice daily  GI DVT prophylaxis -Continue with Lovenox      Thank you for allowing me to participate in the care of this patient.   Patient/Family are satisfied with care plan and all questions have been answered.    Provider disclosure: Patient with at least one acute or chronic illness or injury that poses a threat to life or bodily function and is being managed actively during this encounter.  All of the below services have been performed independently by signing provider:  review of prior documentation from internal and or external health records.  Review of previous and current lab results.  Interview and comprehensive assessment during patient visit today. Review of current and previous chest radiographs/CT scans. Discussion of management  and test interpretation with health care team and patient/family.   This document was prepared using Dragon voice recognition software and may include unintentional dictation errors.     Ottie Glazier, M.D.  Division of Pulmonary & Critical Care Medicine

## 2022-02-13 DIAGNOSIS — A419 Sepsis, unspecified organism: Secondary | ICD-10-CM | POA: Diagnosis not present

## 2022-02-13 DIAGNOSIS — J189 Pneumonia, unspecified organism: Secondary | ICD-10-CM | POA: Diagnosis not present

## 2022-02-13 LAB — CBC
HCT: 34.1 % — ABNORMAL LOW (ref 39.0–52.0)
Hemoglobin: 10.8 g/dL — ABNORMAL LOW (ref 13.0–17.0)
MCH: 28.1 pg (ref 26.0–34.0)
MCHC: 31.7 g/dL (ref 30.0–36.0)
MCV: 88.8 fL (ref 80.0–100.0)
Platelets: 325 10*3/uL (ref 150–400)
RBC: 3.84 MIL/uL — ABNORMAL LOW (ref 4.22–5.81)
RDW: 12.6 % (ref 11.5–15.5)
WBC: 11.4 10*3/uL — ABNORMAL HIGH (ref 4.0–10.5)
nRBC: 0 % (ref 0.0–0.2)

## 2022-02-13 MED ORDER — LINEZOLID 600 MG PO TABS: 600.0000 mg | ORAL_TABLET | Freq: Two times a day (BID) | ORAL | 0 refills | Status: AC

## 2022-02-13 MED ORDER — DULOXETINE HCL 60 MG PO CPEP: 60.0000 mg | ORAL_CAPSULE | Freq: Every day | ORAL | 3 refills | Status: DC

## 2022-02-13 MED ORDER — MUPIROCIN 2 % EX OINT
TOPICAL_OINTMENT | Freq: Two times a day (BID) | CUTANEOUS | Status: DC
  Filled 2022-02-13: qty 22

## 2022-02-13 MED ORDER — MUPIROCIN 2 % EX OINT: TOPICAL_OINTMENT | Freq: Two times a day (BID) | CUTANEOUS | 0 refills | Status: AC

## 2022-02-13 NOTE — Plan of Care (Signed)
  Problem: Activity: Goal: Ability to tolerate increased activity will improve Outcome: Progressing   Problem: Clinical Measurements: Goal: Ability to maintain a body temperature in the normal range will improve Outcome: Progressing   Problem: Respiratory: Goal: Ability to maintain adequate ventilation will improve Outcome: Progressing Goal: Ability to maintain a clear airway will improve Outcome: Progressing   Problem: Fluid Volume: Goal: Hemodynamic stability will improve Outcome: Progressing   Problem: Clinical Measurements: Goal: Diagnostic test results will improve Outcome: Progressing Goal: Signs and symptoms of infection will decrease Outcome: Progressing   Problem: Respiratory: Goal: Ability to maintain adequate ventilation will improve Outcome: Progressing   Problem: Education: Goal: Knowledge of General Education information will improve Description: Including pain rating scale, medication(s)/side effects and non-pharmacologic comfort measures Outcome: Progressing   Problem: Health Behavior/Discharge Planning: Goal: Ability to manage health-related needs will improve Outcome: Progressing   Problem: Clinical Measurements: Goal: Ability to maintain clinical measurements within normal limits will improve Outcome: Progressing Goal: Will remain free from infection Outcome: Progressing Goal: Diagnostic test results will improve Outcome: Progressing Goal: Respiratory complications will improve Outcome: Progressing Goal: Cardiovascular complication will be avoided Outcome: Progressing   Problem: Activity: Goal: Risk for activity intolerance will decrease Outcome: Progressing   Problem: Nutrition: Goal: Adequate nutrition will be maintained Outcome: Progressing   Problem: Coping: Goal: Level of anxiety will decrease Outcome: Progressing   Problem: Elimination: Goal: Will not experience complications related to bowel motility Outcome: Progressing Goal:  Will not experience complications related to urinary retention Outcome: Progressing   Problem: Pain Managment: Goal: General experience of comfort will improve Outcome: Progressing   Problem: Safety: Goal: Ability to remain free from injury will improve Outcome: Progressing   Problem: Skin Integrity: Goal: Risk for impaired skin integrity will decrease Outcome: Progressing   

## 2022-02-13 NOTE — Progress Notes (Signed)
Pt at 94% on room air when ambulating in the hallway. Notified Attending MD.

## 2022-02-13 NOTE — Plan of Care (Signed)
  Problem: Activity: Goal: Ability to tolerate increased activity will improve Outcome: Progressing   Problem: Respiratory: Goal: Ability to maintain adequate ventilation will improve Outcome: Progressing   Problem: Fluid Volume: Goal: Hemodynamic stability will improve Outcome: Progressing   Problem: Clinical Measurements: Goal: Ability to maintain clinical measurements within normal limits will improve Outcome: Progressing   Problem: Elimination: Goal: Will not experience complications related to bowel motility Outcome: Progressing   Problem: Coping: Goal: Level of anxiety will decrease Outcome: Progressing   Problem: Nutrition: Goal: Adequate nutrition will be maintained Outcome: Progressing   Problem: Skin Integrity: Goal: Risk for impaired skin integrity will decrease Outcome: Progressing   Problem: Safety: Goal: Ability to remain free from injury will improve Outcome: Progressing

## 2022-02-13 NOTE — Progress Notes (Signed)
Reviewed discharged instructions with pt. Pt verbalized understanding. Iv catheters intact when removed. Pt discharged with all personal belongings. Staff wheeled pt out. Pt transported to home via family/friend vehicle.

## 2022-02-13 NOTE — Progress Notes (Signed)
Pharmacy Antibiotic Note  Nicholas Alvarez is a 81 y.o. male admitted on 02/10/2022 with pneumonia.  Pharmacy has been consulted for Cefepime dosing. Patient with bronchoscopy 01/16/2022 with BAL culture growing MRSA - unable to find that antibiotic was prescribed. It was resistant to tetracycline and TMP/SMZ. He is on TMP/SMZ MWF prior to admission per pulmonary. Appears to be for prophylaxis  Today, 02/13/2022 Day #3 antibiotics vancomycin WBC elevated 20.7 > 11.7 Renal: SCr WNL Afebrile Patient received vancomyicn 2gm x1 at 00:30 12/6  Plan: Cefepime and azithromycin discontinued.  Continue vancomycin 1000 mg q12H.  Plan to order vancomycin level 12/9 if still continued on vancomycin. Plan to transition to linezolid at discharge. Predicted AUC 482. Goal AUC 400-550. Scr 0.8, Vd 0.72, IBW.   Only PO option for the MRSA isolated on 11/10 is linezolid - not contraindicated with doluxetine or bupropion but monitor for serotonin syndrome   Height: _0  (177.8 cm) Weight: 83.9 kg (185 lb) IBW/kg (Calculated) : 73  Temp (24hrs), Avg:98.3 F (36.8 C), Min:98 F (36.7 C), Max:98.6 F (37 C)  Recent Labs  Lab 02/10/22 1952 02/11/22 0447 02/11/22 0806 02/12/22 0438  WBC 20.7*  --   --  11.7*  CREATININE 0.97 0.80  --  0.77  LATICACIDVEN 1.9 1.3 1.2  --      Estimated Creatinine Clearance: 77.3 mL/min (by C-G formula based on SCr of 0.77 mg/dL).    Allergies  Allergen Reactions   Ace Inhibitors Cough    Antimicrobials this admission: 12/6 vanco >> 12/5 cefepime >> 12/7 12/6 azith >>12/7   Microbiology results: 11/10 BAL cx: MRSA (R to TC, TMP/SMZ) 11/10: virus cx neg, AFB neg MRSA PCR + 12/5 Bcx NGTD  Thank you for allowing pharmacy to be a part of this patient's care.  Eleonore Chiquito, PharmD, BCPS,  Work Cell: [phone] 02/13/2022 9:27 AM

## 2022-02-13 NOTE — Progress Notes (Signed)
PULMONOLOGY         Date: 02/13/2022,   MRN# [id number] WAH SABIC 19-Oct-1940     AdmissionWeight: 83.9 kg                 CurrentWeight: 83.9 kg  Referring provider: Dr Mal Misty   CHIEF COMPLAINT:    Multifocal pneumonia with failure of outpatient therapy.    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  Most recently has developed a episode of recurrent pneumonia with with failure of outpatient antibiotic therapy.  He has had copious amounts of phlegm with forceful cough that is severe in intensity and lasting day and night.  He has been progressively more weak and has also lost weight due to diminished appetite.  Since he has had numerous courses of antibiotics we proceeded with bronchoscopic airway inspection and noted multiple areas with mucous plugging which were evacuated.  He had bronchoalveolar lavage invasive respiratory culture performed which is returned with MRSA.  Today he is status post IV vancomycin and reports slight improvement clinically with less chest discomfort.  His wife is at bedside during my evaluation and reports that he has been progressively getting weaker and she is very thankful for medical care in the hospital.  Patient is now without labored breathing and appears to be more stable.  PCCM consultation for additional evaluation management of this complex respiratory patient.   02/12/22- patient was seen and examined at bedside. He seems to be improved. Wife at bedside we reviewed hospital findings.  Bloodwork is improved with less leukocytosis. Would estimate dc home in 3 days 02/13/22- patient is on room air, he had no overnight events.  He is able to take PO zyvox on outpatient basis for MRSA pneumonia.  Plan today is for PT/OT without supplemental O2 and possible  home discharge.    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[place])    Ischemic cardiomyopathy    with left ventricular ejection fraction of 35%   Myocardial infarction ([place]) 2010   Pneumonia    Sjogren's syndrome ([place])      SURGICAL HISTORY   Past Surgical History:  Procedure Laterality Date   BRONCHIAL WASHINGS N/A 01/16/2022   Procedure: BRONCHIAL WASHINGS;  Surgeon: Ottie Glazier, MD;  Location: [facility] ORS;  Service: Thoracic;  Laterality: N/A;   CARDIAC CATHETERIZATION  03/2008   COLONOSCOPY     CORONARY ARTERY BYPASS GRAFT  03/12/2008   PROSTATE BIOPSY  11/2009   RIGID BRONCHOSCOPY N/A 01/16/2022   Procedure: RIGID BRONCHOSCOPY;  Surgeon: Ottie Glazier, MD;  Location: [facility] ORS;  Service: Thoracic;  Laterality: N/A;     FAMILY HISTORY   Family History  Problem Relation Age of Onset   Coronary artery disease Other    Heart attack Sister 62   Heart attack Maternal Grandfather 51   Heart attack Paternal Grandfather 70   COPD Mother    Pneumonia Father    Cancer Sister      SOCIAL HISTORY   Social History  Tobacco Use   Smoking status: Former   Smokeless tobacco: Never   Tobacco comments:    Quit around age 83  Vaping Use   Vaping Use: Never used  Substance Use Topics   Alcohol use: Yes    Alcohol/week: 12.0 standard drinks of alcohol    Types: 12 Shots of liquor per week   Drug use: No     MEDICATIONS    Home Medication:     Current Medication:  Current Facility-Administered Medications:    acetaminophen (TYLENOL) tablet 650 mg, 650 mg, Oral, Q6H PRN **OR** acetaminophen (TYLENOL) suppository 650 mg, 650 mg, Rectal, Q6H PRN, Mansy, Jan A, MD   acetylcysteine (MUCOMYST) 20 %  nebulizer / oral solution 4 mL, 4 mL, Nebulization, Daily PRN, Mansy, Jan A, MD   budesonide (PULMICORT) nebulizer solution 0.25 mg, 0.25 mg, Nebulization, BID, Mansy, Jan A, MD, 0.25 mg at 02/12/22 2030   buPROPion (WELLBUTRIN XL) 24 hr tablet 150 mg, 150 mg, Oral, Daily, Mansy, Jan A, MD, 150 mg at 02/12/22 0951   carvedilol (COREG) tablet 25 mg, 25 mg, Oral, BID WC, Mansy, Jan A, MD, 25 mg at 02/12/22 1724   chlorpheniramine-HYDROcodone (TUSSIONEX) 10-8 MG/5ML suspension 5 mL, 5 mL, Oral, Q12H PRN, Mansy, Jan A, MD   clopidogrel (PLAVIX) tablet 75 mg, 75 mg, Oral, Daily, Mansy, Jan A, MD, 75 mg at 02/12/22 0951   DULoxetine (CYMBALTA) DR capsule 60 mg, 60 mg, Oral, Daily, Mansy, Jan A, MD, 60 mg at 02/12/22 0951   enoxaparin (LOVENOX) injection 40 mg, 40 mg, Subcutaneous, Q24H, Mansy, Jan A, MD, 40 mg at 02/12/22 0825   finasteride (PROSCAR) tablet 5 mg, 5 mg, Oral, Daily, Mansy, Jan A, MD, 5 mg at 02/12/22 0951   guaiFENesin (MUCINEX) 12 hr tablet 600 mg, 600 mg, Oral, BID, Mansy, Jan A, MD, 600 mg at 02/12/22 2219   ipratropium-albuterol (DUONEB) 0.5-2.5 (3) MG/3ML nebulizer solution 3 mL, 3 mL, Nebulization, TID, Jennye Boroughs, MD, 3 mL at 02/12/22 2030   magnesium hydroxide (MILK OF MAGNESIA) suspension 30 mL, 30 mL, Oral, Daily PRN, Mansy, Jan A, MD   multivitamin with minerals tablet 1 tablet, 1 tablet, Oral, Daily, Jennye Boroughs, MD, 1 tablet at 02/12/22 1724   mycophenolate (CELLCEPT) capsule 1,500 mg, 1,500 mg, Oral, BID, Mansy, Jan A, MD, 1,500 mg at 02/12/22 2220   ondansetron (ZOFRAN) tablet 4 mg, 4 mg, Oral, Q6H PRN **OR** ondansetron (ZOFRAN) injection 4 mg, 4 mg, Intravenous, Q6H PRN, Mansy, Jan A, MD   simvastatin (ZOCOR) tablet 40 mg, 40 mg, Oral, Daily, Mansy, Jan A, MD, 40 mg at 02/12/22 0951   tamsulosin (FLOMAX) capsule 0.4 mg, 0.4 mg, Oral, Daily, Mansy, Jan A, MD, 0.4 mg at 02/12/22 6503   traZODone (DESYREL) tablet 25 mg, 25 mg, Oral, QHS PRN, Mansy, Jan A, MD    vancomycin (VANCOCIN) IVPB 1000 mg/200 mL premix, 1,000 mg, Intravenous, Q12H, Berton Mount, RPH, Last Rate: 200 mL/hr at 02/12/22 2228, 1,000 mg at 02/12/22 2228    ALLERGIES   Ace inhibitors     REVIEW OF SYSTEMS    Review of Systems:  Gen:  Denies  fever, sweats, chills weigh loss  HEENT: Denies blurred vision, double vision, ear pain, eye pain, hearing loss, nose bleeds, sore throat Cardiac:  No dizziness, chest pain or heaviness, chest tightness,edema Resp:   reports dyspnea chronically  Gi: Denies swallowing difficulty, stomach pain, nausea or vomiting, diarrhea, constipation, bowel incontinence Gu:  Denies bladder incontinence, burning  urine Ext:   Denies Joint pain, stiffness or swelling Skin: Denies  skin rash, easy bruising or bleeding or hives Endoc:  Denies polyuria, polydipsia , polyphagia or weight change Psych:   Denies depression, insomnia or hallucinations   Other:  All other systems negative   VS: BP 128/76 (BP Location: Left Arm)   Pulse 80   Temp 98 F (36.7 C) (Oral)   Resp 20   Ht _0  (1.778 m)   Wt 83.9 kg   SpO2 94%   BMI 26.54 kg/m      PHYSICAL EXAM    GENERAL:NAD, no fevers, chills, no weakness no fatigue HEAD: Normocephalic, atraumatic.  EYES: Pupils equal, round, reactive to light. Extraocular muscles intact. No scleral icterus.  MOUTH: Moist mucosal membrane. Dentition intact. No abscess noted.  EAR, NOSE, THROAT: Clear without exudates. No external lesions.  NECK: Supple. No thyromegaly. No nodules. No JVD.  PULMONARY: decreased breath sounds with mild rhonchi worse at bases bilaterally.  CARDIOVASCULAR: S1 and S2. Regular rate and rhythm. No murmurs, rubs, or gallops. No edema. Pedal pulses 2+ bilaterally.  GASTROINTESTINAL: Soft, nontender, nondistended. No masses. Positive bowel sounds. No hepatosplenomegaly.  MUSCULOSKELETAL: No swelling, clubbing, or edema. Range of motion full in all extremities.  NEUROLOGIC: Cranial  nerves II through XII are intact. No gross focal neurological deficits. Sensation intact. Reflexes intact.  SKIN: No ulceration, lesions, rashes, or cyanosis. Skin warm and dry. Turgor intact.  PSYCHIATRIC: Mood, affect within normal limits. The patient is awake, alert and oriented x 3. Insight, judgment intact.       IMAGING   Reviewed CT chest 12/05/2021 with multifocal pneumonia and chest x-ray with similar  ASSESSMENT/PLAN   Recurrent pneumonia  -Status post bronchoscopy with bronchoalveolar lavage growing MRSA -Patient has not had treatment for MRSA and has been on Bactrim and outpatient which seems to be resistant per sensitivity panel -Agree with current antimicrobials including Zithromax, cefepime and vancomycin-I would be okay with narrowing to vancomycin only- -plan for zyvox on outpatient   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  -Continue CellCept 1500 mg twice daily  Coronary artery disease -Continue home regimen -Coreg -Plavix  Chronic bronchiectasis  -Mucomyst 20% nebulized 4 mL twice daily  GI DVT prophylaxis -Continue with Lovenox      Thank you for allowing me to participate in the care of this patient.   Patient/Family are satisfied with care plan and all questions have been answered.    Provider disclosure: Patient with at least one acute or chronic illness or injury that poses a threat to life or bodily function and is being managed actively during this encounter.  All of the below services have been performed independently by signing provider:  review of prior documentation from internal and or external health records.  Review of previous and current lab results.  Interview and comprehensive assessment during patient visit today. Review of current and previous chest radiographs/CT scans. Discussion of management and test interpretation with health care team and patient/family.   This document was prepared using  Dragon voice recognition software and may include unintentional dictation errors.     Ottie Glazier, M.D.  Division of Pulmonary & Critical Care Medicine

## 2022-02-13 NOTE — Discharge Summary (Signed)
Physician Discharge Summary   Patient: Nicholas Alvarez MRN: [id number] DOB: January 19, 1941  Admit date:     02/10/2022  Discharge date: 02/13/22  Discharge Physician: Jennye Boroughs   PCP: Ottie Glazier, MD   Recommendations at discharge:   Follow-up with PCP in 1 week  Discharge Diagnoses: Principal Problem:   Sepsis due to pneumonia [facility]) Active Problems:   Bronchiectasis ([place])   Dyslipidemia   Essential hypertension   BPH (benign prostatic hyperplasia)   Depression  Resolved Problems:   * No resolved hospital problems. [facility] Course:  Nicholas Alvarez is a 81 y.o. male with medical history significant for BPH, coronary artery disease, depression, dyslipidemia, hypertension, interstitial lung disease, bronchiectasis, ischemic cardiomyopathy, Sjogren's syndrome, who presented to the hospital with worsening productive cough, shortness of breath and wheezing.  He also complained of generalized weakness and poor appetite.  He had bronchoscopy with bronchial washing from 01/16/2022 bronchoalveolar lavage was positive for MRSA.  He had been on oral Bactrim until about a week prior to admission.     He was admitted to the hospital for sepsis secondary to recurrent pneumonia.  He was treated with empiric IV antibiotics.  MRSA screen was positive.  Antibiotics were de-escalated to IV vancomycin.  He was able to ambulate without any oxygen desaturation on room air.  He is deemed stable for discharge to home today.  Discharge plan was discussed with Dr. Lanney Gins, pulmonologist.  He recommended a 10-day course of oral Zyvox at discharge.       Consultants: Pulmonologist Procedures performed: None Disposition: Home Diet recommendation:  Discharge Diet Orders (From admission, onward)     Start     Ordered   02/13/22 0000  Diet - low sodium heart healthy        02/13/22 1319           Cardiac diet DISCHARGE MEDICATION: Allergies as of 02/13/2022       Reactions   Ace  Inhibitors Cough        Medication List     STOP taking these medications    Bactrim 400-80 MG tablet Generic drug: sulfamethoxazole-trimethoprim       TAKE these medications    acetylcysteine 20 % nebulizer solution Commonly known as: MUCOMYST Take 4 mLs by nebulization daily as needed.   albuterol (2.5 MG/3ML) 0.083% nebulizer solution Commonly known as: PROVENTIL Take 2.5 mg by nebulization 2 (two) times daily.   budesonide 0.25 MG/2ML nebulizer solution Commonly known as: PULMICORT Take 0.25 mg by nebulization 2 (two) times daily.   buPROPion 150 MG 24 hr tablet Commonly known as: WELLBUTRIN XL Take 150 mg by mouth daily.   carvedilol 25 MG tablet Commonly known as: COREG Take 25 mg by mouth 2 (two) times daily with a meal.   CellCept 500 MG tablet Generic drug: mycophenolate Take 1,500 mg by mouth 2 (two) times daily.   clopidogrel 75 MG tablet Commonly known as: PLAVIX Take 75 mg by mouth daily.   DULoxetine 60 MG capsule Commonly known as: CYMBALTA Take 1 capsule (60 mg total) by mouth daily. Start taking on: February 22, 2022 What changed: These instructions start on February 22, 2022. If you are unsure what to do until then, ask your doctor or other care provider.   finasteride 5 MG tablet Commonly known as: PROSCAR Take 5 mg by mouth daily.   linezolid 600 MG tablet Commonly known as: Zyvox Take 1 tablet (600 mg total) by mouth 2 (two)  times daily for 10 days.   mupirocin ointment 2 % Commonly known as: BACTROBAN Place into the nose 2 (two) times daily for 5 days.   simvastatin 40 MG tablet Commonly known as: ZOCOR Take 40 mg by mouth daily.   tamsulosin 0.4 MG Caps capsule Commonly known as: FLOMAX Take 0.4 mg by mouth daily.        Discharge Exam: Filed Weights   02/10/22 1936  Weight: 83.9 kg   GEN: NAD SKIN: Warm and dry EYES: Anicteric ENT: MMM CV: RRR PULM: Mild bibasilar rales, no wheezing ABD: soft, ND, NT,  +BS CNS: AAO x 3, non focal EXT: No edema or tenderness   Condition at discharge: good  The results of significant diagnostics from this hospitalization (including imaging, microbiology, ancillary and laboratory) are listed below for reference.   Imaging Studies: DG Chest Port 1 View  Result Date: 02/10/2022 CLINICAL DATA:  Dyspnea EXAM: PORTABLE CHEST 1 VIEW COMPARISON:  12/05/2019, CT 12/03/2021 FINDINGS: Coarse bilateral asymmetric reticulonodular infiltrates, more prevalent within the lung apices and right lung base are identified, progressive since both prior examinations, and compatible with progressive chronic atypical infection or acute on chronic infection in the appropriate clinical setting. No pneumothorax or pleural effusion. Coronary artery bypass grafting has been performed. Cardiac size within normal limits. Pulmonary vascularity is normal. No acute bone abnormality. IMPRESSION: 1. Progressive bilateral asymmetric reticulonodular infiltrates, compatible with progressive chronic atypical infection or acute on chronic infection in the appropriate clinical setting. Electronically Signed   By: Fidela Salisbury M.D.   On: 02/10/2022 20:14    Microbiology: Results for orders placed or performed during the hospital encounter of 02/10/22  Resp Panel by RT-PCR (Flu A&B, Covid) Anterior Nasal Swab     Status: None   Collection Time: 02/10/22 11:42 PM   Specimen: Anterior Nasal Swab  Result Value Ref Range Status   SARS Coronavirus 2 by RT PCR NEGATIVE NEGATIVE Final    Comment: (NOTE) SARS-CoV-2 target nucleic acids are NOT DETECTED.  The SARS-CoV-2 RNA is generally detectable in upper respiratory specimens during the acute phase of infection. The lowest concentration of SARS-CoV-2 viral copies this assay can detect is 138 copies/mL. A negative result does not preclude SARS-Cov-2 infection and should not be used as the sole basis for treatment or other patient management decisions. A  negative result may occur with  improper specimen collection/handling, submission of specimen other than nasopharyngeal swab, presence of viral mutation(s) within the areas targeted by this assay, and inadequate number of viral copies(<138 copies/mL). A negative result must be combined with clinical observations, patient history, and epidemiological information. The expected result is Negative.  Fact Sheet for Patients:  EntrepreneurPulse.com.au  Fact Sheet for Healthcare Providers:  IncredibleEmployment.be  This test is no t yet approved or cleared by the [place] FDA and  has been authorized for detection and/or diagnosis of SARS-CoV-2 by FDA under an Emergency Use Authorization (EUA). This EUA will remain  in effect (meaning this test can be used) for the duration of the COVID-19 declaration under Section 564(b)(1) of the Act, 21 U.S.C.section 360bbb-3(b)(1), unless the authorization is terminated  or revoked sooner.       Influenza A by PCR NEGATIVE NEGATIVE Final   Influenza B by PCR NEGATIVE NEGATIVE Final    Comment: (NOTE) The Xpert Xpress SARS-CoV-2/FLU/RSV plus assay is intended as an aid in the diagnosis of influenza from Nasopharyngeal swab specimens and should not be used as a sole basis for treatment.  Nasal washings and aspirates are unacceptable for Xpert Xpress SARS-CoV-2/FLU/RSV testing.  Fact Sheet for Patients: EntrepreneurPulse.com.au  Fact Sheet for Healthcare Providers: IncredibleEmployment.be  This test is not yet approved or cleared by the [place] FDA and has been authorized for detection and/or diagnosis of SARS-CoV-2 by FDA under an Emergency Use Authorization (EUA). This EUA will remain in effect (meaning this test can be used) for the duration of the COVID-19 declaration under Section 564(b)(1) of the Act, 21 U.S.C. section 360bbb-3(b)(1), unless the authorization  is terminated or revoked.  Performed at [facility], [address] [place], [place] [address]   Blood culture (routine x 2)     Status: None (Preliminary result)   Collection Time: 02/10/22 11:42 PM   Specimen: BLOOD  Result Value Ref Range Status   Specimen Description BLOOD BLOOD RIGHT WRIST  Final   Special Requests   Final    Blood Culture results may not be optimal due to an inadequate volume of blood received in culture bottles   Culture   Final    NO GROWTH 2 DAYS Performed at [facility], [address] [place], [place] [address]    Report Status PENDING  Incomplete  Blood culture (routine x 2)     Status: None (Preliminary result)   Collection Time: 02/10/22 11:42 PM   Specimen: BLOOD  Result Value Ref Range Status   Specimen Description BLOOD BLOOD LEFT WRIST  Final   Special Requests   Final    Blood Culture results may not be optimal due to an inadequate volume of blood received in culture bottles   Culture   Final    NO GROWTH 2 DAYS Performed at [facility], [address] [place], [place] [address]    Report Status PENDING  Incomplete  MRSA Next Gen by PCR, Nasal     Status: Abnormal   Collection Time: 02/11/22  1:08 AM   Specimen: Nasal Mucosa; Nasal Swab  Result Value Ref Range Status   MRSA by PCR Next Gen DETECTED (A) NOT DETECTED Final    Comment: RESULT CALLED TO, READ BACK BY AND VERIFIED WITH: Marcelyn Ditty 02/11/2022 AT 50 SRR (NOTE) The GeneXpert MRSA Assay (FDA approved for NASAL specimens only), is one component of a comprehensive MRSA colonization surveillance program. It is not intended to diagnose MRSA infection nor to guide or monitor treatment for MRSA infections. Test performance is not FDA approved in patients less than 10 years old. Performed at [facility], [address] [place],  [address]     Labs: CBC: Recent Labs  Lab 02/10/22 1952 02/12/22 0438 02/13/22 0918   WBC 20.7* 11.7* 11.4*  NEUTROABS 17.9* 9.0*  --   HGB 12.6* 10.3* 10.8*  HCT 40.5 33.1* 34.1*  MCV 91.2 90.4 88.8  PLT 435* 303 937   Basic Metabolic Panel: Recent Labs  Lab 02/10/22 1952 02/11/22 0447 02/12/22 0438  NA 134* 137  --   K 3.9 3.6  --   CL 101 108  --   CO2 24 21*  --   GLUCOSE 123* 89  --   BUN 20 19  --   CREATININE 0.97 0.80 0.77  CALCIUM 9.8 8.8*  --    Liver Function Tests: Recent Labs  Lab 02/11/22 0447  AST 11*  ALT 7  ALKPHOS 54  BILITOT 0.8  PROT 7.0  ALBUMIN 2.5*   CBG: No results for input(s): "GLUCAP" in the last 168 hours.  Discharge time spent: greater than 30 minutes.  Signed: Jennye Boroughs, MD [facility] 02/13/2022

## 2022-02-16 LAB — CULTURE, BLOOD (ROUTINE X 2)
Culture: NO GROWTH
Culture: NO GROWTH

## 2022-03-10 LAB — MAC SUSCEPTIBILITY BROTH
Amikacin: 8
Ciprofloxacin: >8
Clarithromycin: 2
Doxycycline: >8
Linezolid: 32
Minocycline: >8
Moxifloxacin: 4
Rifabutin: 0.12
Rifampin: 4
Streptomycin: 32

## 2022-03-10 LAB — AFB ORGANISM ID BY DNA PROBE: M avium complex: POSITIVE — AB

## 2022-03-10 LAB — ACID FAST SMEAR (AFB, MYCOBACTERIA): Acid Fast Smear: NEGATIVE

## 2022-03-10 LAB — ACID FAST CULTURE WITH REFLEXED SENSITIVITIES (MYCOBACTERIA): Acid Fast Culture: POSITIVE — AB

## 2022-07-03 ENCOUNTER — Emergency Department
Admission: EM | Admit: 2022-07-03 | Discharge: 2022-07-03 | Disposition: A | Discharge status: Home / Self Care | Payer: Medicare Other | Attending: Emergency Medicine | Admitting: Emergency Medicine

## 2022-07-03 DIAGNOSIS — R338 Other retention of urine: Secondary | ICD-10-CM

## 2022-07-03 DIAGNOSIS — R339 Retention of urine, unspecified: Secondary | ICD-10-CM | POA: Diagnosis present

## 2022-07-03 NOTE — ED Triage Notes (Signed)
Pt sts that he has a swollen prostate and is not able to urinate since very early this AM. Pt sts that he is on medication for this but nothing is helping.

## 2022-07-03 NOTE — ED Notes (Signed)
Patient declined discharge vital signs.  Leg bag placed. Patient tolerated procedure well.

## 2022-07-03 NOTE — ED Notes (Signed)
Leg bag placed per Dr. Cyril Loosen.

## 2022-07-03 NOTE — ED Provider Notes (Signed)
   [facility] Provider Note    Event Date/Time   First MD Initiated Contact with Patient 07/03/22 1604     (approximate)   History   Urinary Retention   HPI  Nicholas Alvarez is a 82 y.o. male with a history of BPH who presents with acute urinary retention.  Patient reports that he has not really urinate since last night.  He feels pressure in his bladder.  No fevers or chills, no dysuria reported prior to this.     Physical Exam   Triage Vital Signs: ED Triage Vitals  Enc Vitals Group     BP 07/03/22 1358 131/85     Pulse Rate 07/03/22 1358 (!) 111     Resp 07/03/22 1358 17     Temp 07/03/22 1358 98.5 F (36.9 C)     Temp Source 07/03/22 1358 Oral     SpO2 07/03/22 1358 96 %     Weight 07/03/22 1358 63.5 kg (140 lb)     Height --      Head Circumference --      Peak Flow --      Pain Score 07/03/22 1805 3     Pain Loc --      Pain Edu? --      Excl. in GC? --     Most recent vital signs: Vitals:   07/03/22 1358  BP: 131/85  Pulse: (!) 111  Resp: 17  Temp: 98.5 F (36.9 C)  SpO2: 96%     General: Awake, no distress.  CV:  Good peripheral perfusion.  Resp:  Normal effort.  Abd:  No distention.  Positive suprapubic tenderness Other:     ED Results / Procedures / Treatments   Labs (all labs ordered are listed, but only abnormal results are displayed) Labs Reviewed - No data to display   EKG     RADIOLOGY Bladder scan only 250 cc    PROCEDURES:  Critical Care performed:   Procedures   MEDICATIONS ORDERED IN ED: Medications - No data to display   IMPRESSION / MDM / ASSESSMENT AND PLAN / ED COURSE  I reviewed the triage vital signs and the nursing notes. Patient's presentation is most consistent with acute presentation with potential threat to life or bodily function.  Patient presents with acute urinary retention as detailed above.  Although bladder scan is only 250 cc, his bladder feels more firm than that,  I have asked nurse to place Foley catheter for presumed urinary retention  Catheter placed, patient had significant relief, greater than 400 cc out immediately  Catheter switched leg bag, outpatient follow-up with urology        FINAL CLINICAL IMPRESSION(S) / ED DIAGNOSES   Final diagnoses:  Acute urinary retention     Rx / DC Orders   ED Discharge Orders     None        Note:  This document was prepared using Dragon voice recognition software and may include unintentional dictation errors.   Jene Every, MD 07/03/22 [phone]

## 2022-07-03 NOTE — ED Notes (Signed)
 noted in pt bladder off of scan.

## 2022-07-08 DIAGNOSIS — A310 Pulmonary mycobacterial infection: Secondary | ICD-10-CM

## 2022-07-08 HISTORY — DX: Pulmonary mycobacterial infection: A31.0

## 2022-07-21 ENCOUNTER — Other Ambulatory Visit: Payer: Self-pay | Admitting: Infectious Diseases

## 2022-07-21 DIAGNOSIS — F3341 Major depressive disorder, recurrent, in partial remission: Secondary | ICD-10-CM

## 2022-07-21 DIAGNOSIS — E46 Unspecified protein-calorie malnutrition: Secondary | ICD-10-CM

## 2022-07-21 DIAGNOSIS — M3502 Sicca syndrome with lung involvement: Secondary | ICD-10-CM

## 2022-07-21 DIAGNOSIS — I10 Essential (primary) hypertension: Secondary | ICD-10-CM

## 2022-07-21 DIAGNOSIS — A310 Pulmonary mycobacterial infection: Secondary | ICD-10-CM

## 2022-07-21 DIAGNOSIS — Z79899 Other long term (current) drug therapy: Secondary | ICD-10-CM

## 2022-07-21 DIAGNOSIS — I2510 Atherosclerotic heart disease of native coronary artery without angina pectoris: Secondary | ICD-10-CM

## 2022-07-21 DIAGNOSIS — J849 Interstitial pulmonary disease, unspecified: Secondary | ICD-10-CM

## 2022-07-28 NOTE — Progress Notes (Unsigned)
07/29/2022 4:44 PM   Nicholas Alvarez 03-23-1940 [id number]  Referring provider: Kandyce Rud, MD [phone] [address] [facility] [place],  [place] [address]  Urological history: 1. Urinary retention -episode 2014 -during hospitalization for pneumonia (12/2019)   2. Elevated PSA -prostate biopsy (2011) negative  3. BPH with LU TS -tamsulosin 0.4 mg daily and finasteride 5 mg daily  Chief Complaint  Patient presents with   Other    Discontinue foley, voiding trial    HPI: Nicholas Alvarez is a 82 y.o. male who presents today for trial of void with his wife, Nicholas Alvarez.    Previous records reviewed.   He has a long history of going in and out of urinary retention over the last few years.  He was seen by me in 12/20221 after an episode of urinary retention.  He passed his voiding trial and was instructed to continue his tamsulosin 0.4 mg and finasteride 5 mg and he was scheduled for follow up.  He did not keep follow up appointment,    He then presented to the emergency room July 03, 2022 with a complaint of not being able to urinate since that morning.  He states it was because he forgot to take his prostate medication.  In the emergency room a Foley catheter was placed with a PVR of 500 cc.  He felt that all of this was overkill.  Foley removed this am.    Since his Foley catheter has been removed, he has been in bed asleep.  He placed a depends on prior to going to bed, so he would not have to get up to urinate.  He states his depends were soaked and they were soaked on arrival.    Patient denies any modifying or aggravating factors.  Patient denies any gross hematuria, dysuria or suprapubic/flank pain.  Patient denies any fevers, chills, nausea or vomiting.    PVR 1 mL  He states he is taking his tamsulosin and finasteride.  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    COVID-19    Depression    Dyslipidemia    Dyspnea    Hypertension    Interstitial lung disease (HCC)    Ischemic cardiomyopathy    with left ventricular ejection fraction of 35%   Myocardial infarction (HCC) 2010   Pneumonia    Sjogren's syndrome (HCC)     Surgical History: Past Surgical History:  Procedure Laterality Date   BRONCHIAL WASHINGS N/A 01/16/2022   Procedure: BRONCHIAL WASHINGS;  Surgeon: Vida Rigger, MD;  Location: [facility] ORS;  Service: Thoracic;  Laterality: N/A;   CARDIAC CATHETERIZATION  03/2008   COLONOSCOPY     CORONARY ARTERY BYPASS GRAFT  03/12/2008   PROSTATE BIOPSY  11/2009   RIGID BRONCHOSCOPY N/A 01/16/2022   Procedure: RIGID BRONCHOSCOPY;  Surgeon: Vida Rigger, MD;  Location: [facility] ORS;  Service: Thoracic;  Laterality: N/A;    Home Medications:  Allergies as of 07/29/2022       Reactions   Ace Inhibitors Cough        Medication List        Accurate as of Jul 29, 2022 11:59 PM. If you have any questions, ask your nurse or doctor.          acetylcysteine 20 % nebulizer solution Commonly  known as: MUCOMYST Take 4 mLs by nebulization daily as needed.   albuterol (2.5 MG/3ML) 0.083% nebulizer solution Commonly known as: PROVENTIL Take 2.5 mg by nebulization 2 (two) times daily.   budesonide 0.25 MG/2ML nebulizer solution Commonly known as: PULMICORT Take 0.25 mg by nebulization 2 (two) times daily.   buPROPion 150 MG 24 hr tablet Commonly known as: WELLBUTRIN XL Take 150 mg by mouth daily.   carvedilol 25 MG tablet Commonly known as: COREG Take 25 mg by mouth 2 (two) times daily with a meal.   CellCept 500 MG tablet Generic drug: mycophenolate Take 1,500 mg by mouth 2 (two) times daily.   clopidogrel 75 MG tablet Commonly known as: PLAVIX Take 75 mg by mouth daily.   DULoxetine 60 MG capsule Commonly known as: CYMBALTA Take 1 capsule (60 mg  total) by mouth daily.   finasteride 5 MG tablet Commonly known as: PROSCAR Take 5 mg by mouth daily.   simvastatin 40 MG tablet Commonly known as: ZOCOR Take 40 mg by mouth daily.   tamsulosin 0.4 MG Caps capsule Commonly known as: FLOMAX Take 0.4 mg by mouth daily.        Allergies:  Allergies  Allergen Reactions   Ace Inhibitors Cough    Family History: Family History  Problem Relation Age of Onset   Coronary artery disease Other    Heart attack Sister 73   Heart attack Maternal Grandfather 40   Heart attack Paternal Grandfather 58   COPD Mother    Pneumonia Father    Cancer Sister     Social History:  reports that he has quit smoking. He has never used smokeless tobacco. He reports current alcohol use of about 12.0 standard drinks of alcohol per week. He reports that he does not use drugs.  ROS: Pertinent ROS in HPI  Physical Exam: BP 108/79   Pulse 76   Ht 6\' 2"  (1.88 m)   Wt 129 lb (58.5 kg)   BMI 16.56 kg/m   Constitutional:  Well nourished. Alert and oriented, No acute distress. HEENT: [place] AT, moist mucus membranes.  Trachea midline Cardiovascular: No clubbing, cyanosis, or edema. Respiratory: Normal respiratory effort, no increased work of breathing. Neurologic: Grossly intact, no focal deficits, moving all 4 extremities. Psychiatric: Normal mood and affect.  Laboratory Data: Serum creatinine 0.7 on June 12, 2022, EGFR 94 PSA of 2.11 on June 12, 2022 I have reviewed the labs.   Pertinent Imaging:  07/29/22 16:17  Scan Result 1ml    Assessment & Plan:    1. Urinary retention -Foley removed today -bladder scan demonstrated adequate emptying   2. BPH with LU TS -continue tamsulosin 0.4 mg and finasteride 5 mg daily   Return in about 1 month (around 08/29/2022) for 33mo f/u w/ PVR .  These notes generated with voice recognition software. I apologize for typographical errors.  Cloretta Ned  [facility] [address]  [address] [place], [place] [address] [phone]

## 2022-07-29 ENCOUNTER — Encounter: Payer: Self-pay | Admitting: Urology

## 2022-07-29 ENCOUNTER — Ambulatory Visit: Payer: Medicare Other | Admitting: Urology

## 2022-07-29 VITALS — BP 108/79 | HR 76 | Ht 74.0 in | Wt 129.0 lb

## 2022-07-29 DIAGNOSIS — N401 Enlarged prostate with lower urinary tract symptoms: Secondary | ICD-10-CM

## 2022-07-29 DIAGNOSIS — R338 Other retention of urine: Secondary | ICD-10-CM

## 2022-07-29 DIAGNOSIS — N138 Other obstructive and reflux uropathy: Secondary | ICD-10-CM

## 2022-07-29 LAB — BLADDER SCAN AMB NON-IMAGING

## 2022-07-29 NOTE — Progress Notes (Signed)
Catheter Removal  Patient is present today for a catheter removal.   8 ml of water was drained from the balloon. A 16 FR  coude foley cath was removed from the bladder, no complications were noted. Patient tolerated well.  Performed by: August Luz, RN   Follow up/ Additional notes: Return 1600 today for voiding trial, PVR

## 2022-08-04 ENCOUNTER — Encounter: Payer: Self-pay | Admitting: Urology

## 2022-08-04 ENCOUNTER — Ambulatory Visit
Admission: RE | Admit: 2022-08-04 | Discharge: 2022-08-04 | Disposition: A | Discharge status: Home / Self Care | Payer: Medicare Other | Source: Ambulatory Visit | Attending: Urology | Admitting: Urology

## 2022-08-04 ENCOUNTER — Ambulatory Visit: Payer: Medicare Other | Admitting: Urology

## 2022-08-04 VITALS — BP 132/87 | HR 101 | Ht 74.0 in | Wt 129.0 lb

## 2022-08-04 DIAGNOSIS — K5900 Constipation, unspecified: Secondary | ICD-10-CM | POA: Insufficient documentation

## 2022-08-04 DIAGNOSIS — N401 Enlarged prostate with lower urinary tract symptoms: Secondary | ICD-10-CM | POA: Diagnosis not present

## 2022-08-04 DIAGNOSIS — M3502 Sicca syndrome with lung involvement: Secondary | ICD-10-CM | POA: Diagnosis present

## 2022-08-04 DIAGNOSIS — Z79899 Other long term (current) drug therapy: Secondary | ICD-10-CM | POA: Insufficient documentation

## 2022-08-04 DIAGNOSIS — A310 Pulmonary mycobacterial infection: Secondary | ICD-10-CM | POA: Diagnosis present

## 2022-08-04 DIAGNOSIS — J849 Interstitial pulmonary disease, unspecified: Secondary | ICD-10-CM | POA: Insufficient documentation

## 2022-08-04 DIAGNOSIS — E46 Unspecified protein-calorie malnutrition: Secondary | ICD-10-CM | POA: Insufficient documentation

## 2022-08-04 DIAGNOSIS — I2510 Atherosclerotic heart disease of native coronary artery without angina pectoris: Secondary | ICD-10-CM | POA: Diagnosis present

## 2022-08-04 DIAGNOSIS — F3341 Major depressive disorder, recurrent, in partial remission: Secondary | ICD-10-CM | POA: Diagnosis present

## 2022-08-04 DIAGNOSIS — I10 Essential (primary) hypertension: Secondary | ICD-10-CM | POA: Insufficient documentation

## 2022-08-04 DIAGNOSIS — R338 Other retention of urine: Secondary | ICD-10-CM | POA: Diagnosis not present

## 2022-08-04 LAB — BLADDER SCAN AMB NON-IMAGING: Scan Result: 429

## 2022-08-04 NOTE — Progress Notes (Signed)
Simple Catheter Placement  Due to urinary retention patient is present today for a foley cath placement.  Patient was cleaned and prepped in a sterile fashion with betadine . A 16 FR coude  foley catheter was inserted, urine return was noted  400 ml, urine was slightly cloudy, yellow  in color.  The balloon was filled with 10cc of sterile water.  A overnight bag was attached for drainage. Patient was also given a night bag to take home and was given instruction on how to change from one bag to another.  Patient was given instruction on proper catheter care.  Patient tolerated well, no complications were noted   Performed by: Randa Lynn, RMA

## 2022-08-04 NOTE — Patient Instructions (Signed)

## 2022-08-04 NOTE — Progress Notes (Signed)
08/04/2022 12:06 PM   Nicholas Alvarez [birth year] [id number]  Referring provider: Kandyce Rud, MD [phone] [facility] [address],  [place] [address]  Urological history: 1. Urinary retention -episode 2014 -during hospitalization for pneumonia (12/2019)   2. Elevated PSA -prostate biopsy (2011) negative  3. BPH with LU TS -tamsulosin 0.4 mg daily and finasteride 5 mg daily  Chief Complaint  Patient presents with   Follow-up   Urinary Retention    HPI: Nicholas Alvarez is a 82 y.o. male who presents today for retention with his wife, [name].    Previous records reviewed.   At his 07/29/2022, he has a long history of going in and out of urinary retention over the last few years.  He was seen by me in 12/20221 after an episode of urinary retention.  He passed his voiding trial and was instructed to continue his tamsulosin 0.4 mg and finasteride 5 mg and he was scheduled for follow up.  He did not keep follow up appointment,   He then presented to the emergency room July 03, 2022 with a complaint of not being able to urinate since that morning.  He states it was because he forgot to take his prostate medication.  In the emergency room a Foley catheter was placed with a PVR of 500 cc.  He felt that all of this was overkill.  Foley removed this am.   Since his Foley catheter has been removed, he has been in bed asleep.  He placed a depends on prior to going to bed, so he would not have to get up to urinate.  He states his depends were soaked and they were soaked on arrival.   Patient denies any modifying or aggravating factors.  Patient denies any gross hematuria, dysuria or suprapubic/flank pain.  Patient denies any fevers, chills, nausea or vomiting.   PVR 1 mL.   He states he is taking his tamsulosin and finasteride.  He was scheduled to return in 1 month for a IPSS/PVR.  Over the weekend, he he was voiding well.  He denied any weakening of  the urinary stream, feelings of incomplete bladder emptying or straining to urinate.  This morning when he awoke he was in frank urinary retention.  Patient denies any modifying or aggravating factors.  Patient denies any gross hematuria, dysuria or suprapubic/flank pain.  Patient denies any fevers, chills, nausea or vomiting.    He has been having episodes of soft stool and even had to defecate in the parking lot yesterday.  KUB  large pelvic phleboliths no significant stool burden.  He has a CT scheduled for tomorrow.   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    COVID-19    Depression    Dyslipidemia    Dyspnea    Hypertension    Interstitial lung disease (HCC)    Ischemic cardiomyopathy    with left ventricular ejection fraction of 35%   Myocardial infarction (HCC) 2010   Pneumonia    Sjogren's syndrome (HCC)     Surgical History: Past Surgical History:  Procedure Laterality Date   BRONCHIAL WASHINGS N/A 01/16/2022   Procedure: BRONCHIAL WASHINGS;  Surgeon: Vida Rigger, MD;  Location: [facility] ORS;  Service: Thoracic;  Laterality: N/A;   CARDIAC CATHETERIZATION  03/2008   COLONOSCOPY     CORONARY ARTERY BYPASS GRAFT  03/12/2008   PROSTATE BIOPSY  11/2009   RIGID BRONCHOSCOPY N/A 01/16/2022   Procedure: RIGID BRONCHOSCOPY;  Surgeon: Vida Rigger, MD;  Location: [facility] ORS;  Service: Thoracic;  Laterality: N/A;    Home Medications:  Allergies as of 08/04/2022       Reactions   Ace Inhibitors Cough        Medication List        Accurate as of Aug 04, 2022 12:06 PM. If you have any questions, ask your nurse or doctor.          acetylcysteine 20 % nebulizer solution Commonly known as: MUCOMYST Take 4 mLs by nebulization daily as needed.   albuterol (2.5 MG/3ML) 0.083% nebulizer solution Commonly  known as: PROVENTIL Take 2.5 mg by nebulization 2 (two) times daily.   budesonide 0.25 MG/2ML nebulizer solution Commonly known as: PULMICORT Take 0.25 mg by nebulization 2 (two) times daily.   buPROPion 150 MG 24 hr tablet Commonly known as: WELLBUTRIN XL Take 150 mg by mouth daily.   carvedilol 25 MG tablet Commonly known as: COREG Take 25 mg by mouth 2 (two) times daily with a meal.   CellCept 500 MG tablet Generic drug: mycophenolate Take 1,500 mg by mouth 2 (two) times daily.   clopidogrel 75 MG tablet Commonly known as: PLAVIX Take 75 mg by mouth daily.   DULoxetine 60 MG capsule Commonly known as: CYMBALTA Take 1 capsule (60 mg total) by mouth daily.   finasteride 5 MG tablet Commonly known as: PROSCAR Take 5 mg by mouth daily.   mirtazapine 15 MG tablet Commonly known as: REMERON Take 1 tablet by mouth at bedtime.   simvastatin 40 MG tablet Commonly known as: ZOCOR Take 40 mg by mouth daily.   tamsulosin 0.4 MG Caps capsule Commonly known as: FLOMAX Take 0.4 mg by mouth daily.        Allergies:  Allergies  Allergen Reactions   Ace Inhibitors Cough    Family History: Family History  Problem Relation Age of Onset   Coronary artery disease Other    Heart attack Sister 7   Heart attack Maternal Grandfather 40   Heart attack Paternal Grandfather 6   COPD Mother    Pneumonia Father    Cancer Sister     Social History:  reports that he has quit smoking. He has never used smokeless tobacco. He reports current alcohol use of about 12.0 standard drinks of alcohol per week. He reports that he does not use drugs.  ROS: Pertinent ROS in HPI  Physical Exam: BP 132/87   Pulse (!) 101   Ht 6\' 2"  (1.88 m)   Wt 129 lb (58.5 kg)   BMI 16.56 kg/m   Constitutional:  Well nourished. Alert and oriented, No acute distress. HEENT: [place] AT, moist mucus membranes.  Trachea midline Cardiovascular: No clubbing, cyanosis, or edema. Respiratory: Normal  respiratory effort, no increased work of breathing. Neurologic: Grossly intact, no focal deficits, moving all 4 extremities. Psychiatric: Normal mood and affect.   Laboratory Data: N/A  Pertinent Imaging:  08/04/22 12:05  Scan Result 429 ml    Assessment & Plan:    1. Urinary retention -Foley placed today -KUB did not demonstrate any constipation -CT scan is scheduled for tomorrow by PCP for further evaluation of weight loss -will need to schedule cysto for further evaluation of retention -Explained that the cystoscopy is a safe and common  diagnostic test performed by one of our physicians in the office.  It consist of using a thin, lighted tube to look directly inside the bladder, prostate and urethra to evaluate the anatomy.  The procedure is brief, typically taking about 5 minutes. -This will unable [place] to assess bladder health, diagnose and enlarged prostate, assess which BPH procedure may be most appropriate and rule out other bladder conditions (stricture disease, stones, cancer, etc.)  -Advised the patient that there are no restrictions to eating or drinking prior to the cystoscopy -They can continue to take all of their medications as prescribed -They can drive themselves to and from the appointment -I explained that during the procedure, the area around the urethra will be cleansed thoroughly, topical anesthetic will be applied to numb your urethra, the thin tube is then gently inserted through the urethra into your bladder while fluid flows through the tube to the bladder to enable better visualization -I explained the procedure is usually not painful, however there may be some discomfort (pinching feeling), and they may feel an urge to urinate, coolness or fullness in the bladder and then the cystoscope is removed -After the cystoscopy, I advised them that they may experience urinary frequency, and infection, hematuria, dysuria which will resolve within 24 to 48 hours -Reviewed  red flag signs (fever, bright red blood or blood clots in the urine, abdominal pain or difficulty urinating) and to contact the office immediately or seek treatment in the ED if they should experience any of these -The physician will discuss the results of the cystoscopy at the time of the procedure  -schedule cysto   2. BPH with LU TS -continue tamsulosin 0.4 mg and finasteride 5 mg daily   Return for cysto for retention .  These notes generated with voice recognition software. I apologize for typographical errors.  Cloretta Ned  [facility] [address]  [address] [place], [place] [address] [phone]

## 2022-08-05 ENCOUNTER — Ambulatory Visit
Admission: RE | Admit: 2022-08-05 | Discharge: 2022-08-05 | Disposition: A | Discharge status: Home / Self Care | Payer: Medicare Other | Source: Ambulatory Visit | Attending: Infectious Diseases | Admitting: Infectious Diseases

## 2022-08-05 DIAGNOSIS — Z79899 Other long term (current) drug therapy: Secondary | ICD-10-CM

## 2022-08-05 DIAGNOSIS — E46 Unspecified protein-calorie malnutrition: Secondary | ICD-10-CM

## 2022-08-05 DIAGNOSIS — F3341 Major depressive disorder, recurrent, in partial remission: Secondary | ICD-10-CM

## 2022-08-05 DIAGNOSIS — A310 Pulmonary mycobacterial infection: Secondary | ICD-10-CM

## 2022-08-05 DIAGNOSIS — M3502 Sicca syndrome with lung involvement: Secondary | ICD-10-CM

## 2022-08-05 DIAGNOSIS — I10 Essential (primary) hypertension: Secondary | ICD-10-CM

## 2022-08-05 DIAGNOSIS — J849 Interstitial pulmonary disease, unspecified: Secondary | ICD-10-CM

## 2022-08-05 DIAGNOSIS — I2510 Atherosclerotic heart disease of native coronary artery without angina pectoris: Secondary | ICD-10-CM

## 2022-08-05 LAB — POCT I-STAT CREATININE: Creatinine, Ser: 0.8 mg/dL (ref 0.61–1.24)

## 2022-08-05 MED ORDER — IOHEXOL 300 MG/ML  SOLN
100.0000 mL | Freq: Once | INTRAMUSCULAR | Status: AC | PRN
  Administered 2022-08-05: 100 mL via INTRAVENOUS

## 2022-08-12 ENCOUNTER — Telehealth: Payer: Self-pay | Admitting: Urology

## 2022-08-12 NOTE — Telephone Encounter (Signed)
Appointment made

## 2022-08-12 NOTE — Telephone Encounter (Signed)
He will need an appointment for catheter exchange around June 28 because his cystoscopy appointment is not until July.

## 2022-08-20 ENCOUNTER — Other Ambulatory Visit: Payer: Medicare Other | Admitting: Urology

## 2022-09-02 NOTE — Progress Notes (Unsigned)
Cath Change/ Replacement  Patient is present today for a catheter change due to urinary retention.  8 ml of water was removed from the balloon, a 16 FR Coude foley cath was removed without difficulty.  Patient was cleaned and prepped in a sterile fashion with betadine and 2% lidocaine jelly was instilled into the urethra. A 16 FR Coude foley cath was replaced into the bladder, no complications were noted. Urine return was noted 30 ml and urine was yellow clear in color. The balloon was filled with 10ml of sterile water. A leg bag was attached for drainage.  Patient was given proper instruction on catheter care.    Performed by: Michiel Cowboy, PA-C   He and his wife complained of leaking around the Foley with the associated feeling of cramping.  They have not noted any sediment in the urine, but he is passing some long thin clots at time.  Patient denies any modifying or aggravating factors.  Patient denies any recent UTI's, gross hematuria, dysuria or suprapubic/flank pain.  Patient denies any fevers, chills, nausea or vomiting.   I explained that he is likely having bladder spasms and that we can give him an OAB agent to help with this issues.    I gave him 14 days samples of Gemtesa 75 mg daily.  Follow up: Keep cysto appointment 09/22/2022

## 2022-09-03 ENCOUNTER — Ambulatory Visit: Payer: Medicare Other | Admitting: Urology

## 2022-09-03 ENCOUNTER — Encounter: Payer: Self-pay | Admitting: Urology

## 2022-09-03 VITALS — BP 103/68 | HR 80 | Ht 74.0 in | Wt 129.0 lb

## 2022-09-03 DIAGNOSIS — N3289 Other specified disorders of bladder: Secondary | ICD-10-CM

## 2022-09-03 DIAGNOSIS — R338 Other retention of urine: Secondary | ICD-10-CM | POA: Diagnosis not present

## 2022-09-03 MED ORDER — GEMTESA 75 MG PO TABS: 75.0000 mg | ORAL_TABLET | Freq: Every day | ORAL | 0 refills | Status: DC

## 2022-09-22 ENCOUNTER — Ambulatory Visit: Payer: Medicare Other | Admitting: Urology

## 2022-09-22 VITALS — BP 101/66 | HR 87

## 2022-09-22 DIAGNOSIS — N401 Enlarged prostate with lower urinary tract symptoms: Secondary | ICD-10-CM

## 2022-09-22 DIAGNOSIS — N138 Other obstructive and reflux uropathy: Secondary | ICD-10-CM

## 2022-09-22 DIAGNOSIS — R310 Gross hematuria: Secondary | ICD-10-CM

## 2022-09-22 DIAGNOSIS — R338 Other retention of urine: Secondary | ICD-10-CM

## 2022-09-22 NOTE — Patient Instructions (Signed)

## 2022-09-23 ENCOUNTER — Other Ambulatory Visit: Payer: Self-pay

## 2022-09-23 DIAGNOSIS — N401 Enlarged prostate with lower urinary tract symptoms: Secondary | ICD-10-CM

## 2022-09-23 NOTE — Progress Notes (Signed)
Surgical Physician Order Form [facility]  Dr. Vanna Scotland, MD  * Scheduling expectation : Next Available  *Length of Case:   *Clearance needed: yes, pulm and cards (hold plavix/ asa)  *Anticoagulation Instructions: Hold all anticoagulants  *Aspirin Instructions: Hold Aspirin  *Post-op visit Date/Instructions:  1 week voiding trial  *Diagnosis: BPH w/urinary obstruction  *Procedure:  HOLEP (95284)   Additional orders: N/A  -Admit type: OUTpatient  -Anesthesia: General  -VTE Prophylaxis Standing Order SCD's       Other:   -Standing Lab Orders Per Anesthesia    Lab other: UA&Urine Culture  -Standing Test orders EKG/Chest x-ray per Anesthesia       Test other:   - Medications:  Ancef 2gm IV  -Other orders:  N/A

## 2022-09-23 NOTE — Progress Notes (Signed)
   09/23/22  CC:  Chief Complaint  Patient presents with   Cysto    HPI: 82 year old male with a personal history of prostamegaly and urinary retention who presents today for cystoscopy.  He has a personal history of BPH managed on finasteride and Flomax.  Prostate volume estimated to be 160 cm based on most recent CT scan.  He reports over the last 3 days, has had very little urine output, leaking around the catheter and blood per meatus and in the catheter bag.  Upon examining the patient, his catheter appears to be dislodged (spaghetti side).  Balloon was taken down and catheter was removed.  He is accompanied today by his wife.  Blood pressure 101/66, pulse 87. NED. A&Ox3.   No respiratory distress   Abd soft, NT, ND Normal phallus with bilateral descended testicles  Cystoscopy Procedure Note  Patient identification was confirmed, informed consent was obtained, and patient was prepped using Betadine solution.  Lidocaine jelly was administered per urethral meatus.     Pre-Procedure: - Inspection reveals a normal caliber ureteral meatus.  Procedure: The flexible cystoscope was introduced without difficulty - No urethral strictures/lesions are present. - Enlarged prostate trilobar coaptation - Elevated bladder neck -Visualization of the bladder was very poor today.  There is evidence of what appeared to be blood clots.  There is no obvious tumors.  The cystoscope was removed and a 22 [place] coud catheter was placed.  He was then irrigated with approximately 800 cc of saline.  This yielded approximately 20 cc of clot and cleared to light pink.  He did not appear to have any active bleeding.  The balloon was overfilled with 20 cc (15 cc balloon).   Post-Procedure: - Patient tolerated the procedure well  Assessment/ Plan:  1. Acute urinary retention Secondary to massive prostamegaly and decompensation  Currently being managed on maximal medical therapy  Has  failed previous voiding trials  At this point in time, strongly recommended outlet procedure in the form of HoLEP based on the patient's prostate volume.    We discussed the common postoperative course following holep including need for overnight Foley catheter, temporary worsening of irritative voiding symptoms, and occasional stress incontinence which typically lasts up to 6 months but can persist.  We discussed retrograde ejaculation and damage to surrounding structures including the urinary sphincter. Other uncommon complications including hematuria and urinary tract infection.  He understands all of the above and is willing to proceed as planned.   2. BPH with obstruction/lower urinary tract symptoms As above  3. Gross hematuria Clots irrigated/evacuated-can [place] to dislodged catheter/Foley trauma  No active bleeding noted, urine remained very light pink for 15 minutes after irrigation which is reassuring.  Catheter has been repositioned.  Strict warning symptoms reviewed, catheter quits draining or develops clot or discomfort again, do not wait, seek medical attention either at our office or the emergency room.  Schedule surgery  Vanna Scotland, MD

## 2022-09-23 NOTE — H&P (View-Only) (Signed)
   09/23/22  CC:  Chief Complaint  Patient presents with   Cysto    HPI: 82 year old male with a personal history of prostamegaly and urinary retention who presents today for cystoscopy.  He has a personal history of BPH managed on finasteride and Flomax.  Prostate volume estimated to be 160 cm based on most recent CT scan.  He reports over the last 3 days, has had very little urine output, leaking around the catheter and blood per meatus and in the catheter bag.  Upon examining the patient, his catheter appears to be dislodged (spaghetti side).  Balloon was taken down and catheter was removed.  He is accompanied today by his wife.  Blood pressure 101/66, pulse 87. NED. A&Ox3.   No respiratory distress   Abd soft, NT, ND Normal phallus with bilateral descended testicles  Cystoscopy Procedure Note  Patient identification was confirmed, informed consent was obtained, and patient was prepped using Betadine solution.  Lidocaine jelly was administered per urethral meatus.     Pre-Procedure: - Inspection reveals a normal caliber ureteral meatus.  Procedure: The flexible cystoscope was introduced without difficulty - No urethral strictures/lesions are present. - Enlarged prostate trilobar coaptation - Elevated bladder neck -Visualization of the bladder was very poor today.  There is evidence of what appeared to be blood clots.  There is no obvious tumors.  The cystoscope was removed and a 22 [place] coud catheter was placed.  He was then irrigated with approximately 800 cc of saline.  This yielded approximately 20 cc of clot and cleared to light pink.  He did not appear to have any active bleeding.  The balloon was overfilled with 20 cc (15 cc balloon).   Post-Procedure: - Patient tolerated the procedure well  Assessment/ Plan:  1. Acute urinary retention Secondary to massive prostamegaly and decompensation  Currently being managed on maximal medical therapy  Has  failed previous voiding trials  At this point in time, strongly recommended outlet procedure in the form of HoLEP based on the patient's prostate volume.    We discussed the common postoperative course following holep including need for overnight Foley catheter, temporary worsening of irritative voiding symptoms, and occasional stress incontinence which typically lasts up to 6 months but can persist.  We discussed retrograde ejaculation and damage to surrounding structures including the urinary sphincter. Other uncommon complications including hematuria and urinary tract infection.  He understands all of the above and is willing to proceed as planned.   2. BPH with obstruction/lower urinary tract symptoms As above  3. Gross hematuria Clots irrigated/evacuated-can [place] to dislodged catheter/Foley trauma  No active bleeding noted, urine remained very light pink for 15 minutes after irrigation which is reassuring.  Catheter has been repositioned.  Strict warning symptoms reviewed, catheter quits draining or develops clot or discomfort again, do not wait, seek medical attention either at our office or the emergency room.  Schedule surgery  Vanna Scotland, MD

## 2022-09-28 ENCOUNTER — Telehealth: Payer: Self-pay

## 2022-09-28 NOTE — Progress Notes (Signed)
   [facility] Surgical Posting Form  Surgery Date: Date: 10/12/2022  Surgeon: Dr. 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 Larwance Sachs  Clearance request sent on: Date: 09/28/22   *Orders entered into EPIC  Date: 09/28/22   *Case booked in EPIC  Date: 09/24/2022  *Notified pt of Surgery: Date: 09/24/2022  PRE-OP UA & CX: yes, will obtain in clinic on 10/01/2022  *Placed into Prior Authorization Work Angela Nevin Date: 09/28/22  Assistant/laser/rep:No

## 2022-09-28 NOTE — Progress Notes (Signed)
  Phone Number: [phone] for Surgical Coordinator Fax Number: [phone]  REQUEST FOR SURGICAL CLEARANCE       Date: Date: 09/28/22  Faxed to: Dr. Karna Christmas, MD  Surgeon: Dr. Vanna Scotland, MD     Date of Surgery: 10/12/2022  Operation: Holmium Laser Enucleation of the Prostate   Anesthesia Type: General   Diagnosis: Benign Prostatic Hyperplasia with Urinary Obstruction  Patient Requires:    Pulmonary Clearance : Yes   Risk Assessment:    Low   []       Moderate   []     High   []           This patient is optimized for surgery  YES []       NO   []    I recommend further assessment/workup prior to surgery. YES []      NO  []   Appointment scheduled for: _______________________   Further recommendations: ____________________________________     Physician Signature:__________________________________   Printed Name: ________________________________________   Date: _________________

## 2022-09-28 NOTE — Progress Notes (Signed)
  Phone Number: [phone] for Surgical Coordinator Fax Number: [phone]  REQUEST FOR SURGICAL CLEARANCE       Date: Date: 09/28/22  Faxed to: Dr. Larwance Sachs, MD  Surgeon: Dr. Vanna Scotland, MD     Date of Surgery: 10/12/2022  Operation: Holmium Laser Enucleation of the Prostate   Anesthesia Type: General   Diagnosis: Benign Prostatic Hyperplasia with Urinary Obstruction  Patient Requires:   Medical Clearance : Yes  Reason: Patient will need to hold Plavix and Aspirin. How many days do you recommend?   Risk Assessment:    Low   []       Moderate   []     High   []           This patient is optimized for surgery  YES []       NO   []    I recommend further assessment/workup prior to surgery. YES []      NO  []   Appointment scheduled for: _______________________   Further recommendations: ____________________________________     Physician Signature:__________________________________   Printed Name: ________________________________________   Date: _________________

## 2022-09-28 NOTE — Telephone Encounter (Signed)
I spoke with Mr. Torre. We have discussed possible surgery dates and Monday August 5th, 2024 was agreed upon by all parties. Patient given information about surgery date, what to expect pre-operatively and post operatively.  We discussed that a Pre-Admission Testing office will be calling to set up the pre-op visit that will take place prior to surgery, and that these appointments are typically done over the phone with a Pre-Admissions RN. Informed patient that our office will communicate any additional care to be provided after surgery. Patients questions or concerns were discussed during our call. Advised to call our office should there be any additional information, questions or concerns that arise. Patient verbalized understanding.

## 2022-09-30 NOTE — Progress Notes (Unsigned)
Simple Catheter Placement  Due to urinary retention patient is present today for a foley cath placement.  Patient was cleaned and prepped in a sterile fashion with betadine and 2% lidocaine jelly was instilled into the urethra. A 22  Coloplast FR foley catheter was inserted, urine return was noted  50 ml, urine was yellow in color.  The balloon was filled with 10cc of sterile water.  A leg bag was attached for drainage.  Patient was also given a night bag to take home and was given instruction on how to change from one bag to another.  Patient was given instruction on proper catheter care.  Patient tolerated well, no complications were noted   Performed by: Michiel Cowboy, PA-C   Additional notes/ Follow up: Urine is collected for urinalysis and urine culture

## 2022-10-01 ENCOUNTER — Encounter: Payer: Self-pay | Admitting: Urology

## 2022-10-01 ENCOUNTER — Ambulatory Visit: Payer: Medicare Other | Admitting: Urology

## 2022-10-01 VITALS — BP 104/69 | HR 71 | Wt 129.0 lb

## 2022-10-01 DIAGNOSIS — R339 Retention of urine, unspecified: Secondary | ICD-10-CM | POA: Diagnosis not present

## 2022-10-01 DIAGNOSIS — N401 Enlarged prostate with lower urinary tract symptoms: Secondary | ICD-10-CM

## 2022-10-01 NOTE — Progress Notes (Signed)
Catheter Removal  Patient is present today for a catheter removal.  9 ml of water was drained from the balloon. A 22 FR siliastic foley cath was removed from the bladder, no complications were noted. Patient tolerated well.  Performed by: Malcolm Metro RMA  Follow up/ Additional notes: F/UP as scheduled

## 2022-10-02 LAB — URINALYSIS, COMPLETE

## 2022-10-05 ENCOUNTER — Encounter
Admission: RE | Admit: 2022-10-05 | Discharge: 2022-10-05 | Disposition: A | Discharge status: Home / Self Care | Payer: Medicare Other | Source: Ambulatory Visit | Attending: Urology | Admitting: Urology

## 2022-10-05 DIAGNOSIS — I219 Acute myocardial infarction, unspecified: Secondary | ICD-10-CM

## 2022-10-05 DIAGNOSIS — Z01810 Encounter for preprocedural cardiovascular examination: Secondary | ICD-10-CM

## 2022-10-05 DIAGNOSIS — I10 Essential (primary) hypertension: Secondary | ICD-10-CM

## 2022-10-05 DIAGNOSIS — I25709 Atherosclerosis of coronary artery bypass graft(s), unspecified, with unspecified angina pectoris: Secondary | ICD-10-CM

## 2022-10-05 HISTORY — DX: Male erectile dysfunction, unspecified: N52.9

## 2022-10-05 HISTORY — DX: Unspecified fall, initial encounter: W19.XXXA

## 2022-10-05 HISTORY — DX: Sepsis, unspecified organism: A41.9

## 2022-10-05 HISTORY — DX: Aneurysm of heart: I25.3

## 2022-10-05 HISTORY — DX: Acute respiratory failure with hypoxia: J96.01

## 2022-10-05 HISTORY — DX: Anemia, unspecified: D64.9

## 2022-10-05 HISTORY — DX: Elevated prostate specific antigen (PSA): R97.20

## 2022-10-05 HISTORY — DX: Unspecified severe protein-calorie malnutrition: E43

## 2022-10-05 HISTORY — DX: Personal history of other diseases of the circulatory system: Z86.79

## 2022-10-05 HISTORY — DX: Repeated falls: R29.6

## 2022-10-05 HISTORY — DX: Gross hematuria: R31.0

## 2022-10-05 HISTORY — DX: Bronchiectasis, uncomplicated: J47.9

## 2022-10-05 NOTE — Patient Instructions (Addendum)
Your procedure is scheduled on:10-12-22 Monday Report to the Registration Desk on the 1st floor of the [facility].Then proceed to the 2nd floor Surgery Desk To find out your arrival time, please call [phone] between 1PM - 3PM on:10-09-22 Friday  If your arrival time is 6:00 am, do not arrive before that time as the [facility] entrance doors do not open until 6:00 am.  REMEMBER: Instructions that are not followed completely may result in serious medical risk, up to and including death; or upon the discretion of your surgeon and anesthesiologist your surgery may need to be rescheduled.  Do not eat food OR drink any liquids after midnight the night before surgery.  No gum chewing or hard candies.  One week prior to surgery:Last dose 10-05-22 Stop Anti-inflammatories (NSAIDS) such as Advil, Aleve, Ibuprofen, Motrin, Naproxen, Naprosyn and Aspirin based products such as Excedrin, Goody's Powder, BC Powder.You may however, take Tylenol if needed for pain up until the day of surgery. Stop ANY OVER THE COUNTER supplements/vitamins NOW (10-05-22) until after surgery (Vitamin B12, Ferrous Sulfate and Brain Function)-you may continue your Melatonin up until the night prior to surgery   Continue taking all prescribed medications with the exception of the following:  TAKE ONLY THESE MEDICATIONS THE MORNING OF SURGERY WITH A SIP OF WATER: -carvedilol (COREG)  -ethambutol (MYAMBUTOL)  -finasteride (PROSCAR)  -mycophenolate (CELLCEPT)  -rifampin (RIFADIN)  -simvastatin (ZOCOR)  -tamsulosin (FLOMAX)  -Vibegron (GEMTESA)   Call Dr Delana Meyer office today (10-05-22) to find out when you need to stop your clopidogrel (PLAVIX)   Use your Albuterol Inhaler the morning of surgery and bring your Albuterol Inhaler to the hospital   No Alcohol for 24 hours before or after surgery.  No Smoking including e-cigarettes for 24 hours before surgery.  No chewable tobacco products for at least 6 hours before  surgery.  No nicotine patches on the day of surgery.  Do not use any "recreational" drugs for at least a week (preferably 2 weeks) before your surgery.  Please be advised that the combination of cocaine and anesthesia may have negative outcomes, up to and including death. If you test positive for cocaine, your surgery will be cancelled.  On the morning of surgery brush your teeth with toothpaste and water, you may rinse your mouth with mouthwash if you wish. Do not swallow any toothpaste or mouthwash.  Do not wear jewelry, make-up, hairpins, clips or nail polish.  Do not wear lotions, powders, or perfumes.   Do not shave body hair from the neck down 48 hours before surgery.  Contact lenses, hearing aids and dentures may not be worn into surgery.  Do not bring valuables to the hospital. [facility] is not responsible for any missing/lost belongings or valuables.   Notify your doctor if there is any change in your medical condition (cold, fever, infection).  Wear comfortable clothing (specific to your surgery type) to the hospital.  After surgery, you can help prevent lung complications by doing breathing exercises.  Take deep breaths and cough every 1-2 hours. Your doctor may order a device called an Incentive Spirometer to help you take deep breaths. When coughing or sneezing, hold a pillow firmly against your incision with both hands. This is called "splinting." Doing this helps protect your incision. It also decreases belly discomfort.  If you are being admitted to the hospital overnight, leave your suitcase in the car. After surgery it may be brought to your room.  In case of increased patient  census, it may be necessary for you, the patient, to continue your postoperative care in the Same Day Surgery department.  If you are being discharged the day of surgery, you will not be allowed to drive home. You will need a responsible individual to drive you home and stay with you for 24  hours after surgery.   If you are taking public transportation, you will need to have a responsible individual with you.  Please call the Pre-admissions Testing Dept. at [phone] if you have any questions about these instructions.  Surgery Visitation Policy:  Patients having surgery or a procedure may have two visitors.  Children under the age of 47 must have an adult with them who is not the patient.

## 2022-10-05 NOTE — Pre-Procedure Instructions (Addendum)
Called pt to do his anesthesia interview. Pt states that no one has told him when to stop his Plavix. Letta Kocher CMA of Dr Apolinar Junes had faxed over Medication clearance on 09-23-22 regarding input from pcp [facility]) on when to stop Plavix. I cannot find anything from pcp in care everywhere regarding when plavix needs to be stopped. I secure chatted Letta Kocher who said she has not heard anything back. Informed pt to call over to Dr Assunta Gambles office today to find out when Plavix needs to be stopped. Pt verbalized understanding  Update:Melissa Bland Span just messaged me back stating she spoke with pcp office again and refaxed her medication clearance for Plavix.

## 2022-10-06 ENCOUNTER — Encounter
Admission: RE | Admit: 2022-10-06 | Discharge: 2022-10-06 | Disposition: A | Discharge status: Home / Self Care | Payer: Medicare Other | Source: Ambulatory Visit | Attending: Urology | Admitting: Urology

## 2022-10-06 DIAGNOSIS — I25709 Atherosclerosis of coronary artery bypass graft(s), unspecified, with unspecified angina pectoris: Secondary | ICD-10-CM | POA: Diagnosis not present

## 2022-10-06 DIAGNOSIS — I10 Essential (primary) hypertension: Secondary | ICD-10-CM | POA: Insufficient documentation

## 2022-10-06 DIAGNOSIS — I219 Acute myocardial infarction, unspecified: Secondary | ICD-10-CM | POA: Insufficient documentation

## 2022-10-06 DIAGNOSIS — Z01810 Encounter for preprocedural cardiovascular examination: Secondary | ICD-10-CM | POA: Diagnosis not present

## 2022-10-11 MED ORDER — CHLORHEXIDINE GLUCONATE 0.12 % MT SOLN
15.0000 mL | Freq: Once | OROMUCOSAL | Status: AC
  Administered 2022-10-12: 15 mL via OROMUCOSAL

## 2022-10-11 MED ORDER — LACTATED RINGERS IV SOLN: INTRAVENOUS | Status: DC

## 2022-10-11 MED ORDER — FAMOTIDINE 20 MG PO TABS
20.0000 mg | ORAL_TABLET | Freq: Once | ORAL | Status: AC
  Administered 2022-10-12: 20 mg via ORAL

## 2022-10-11 MED ORDER — CEFAZOLIN SODIUM-DEXTROSE 2-4 GM/100ML-% IV SOLN
2.0000 g | INTRAVENOUS | Status: AC
  Administered 2022-10-12: 2 g via INTRAVENOUS

## 2022-10-11 MED ORDER — ORAL CARE MOUTH RINSE: 15.0000 mL | Freq: Once | OROMUCOSAL | Status: AC

## 2022-10-12 ENCOUNTER — Ambulatory Visit
Admission: RE | Admit: 2022-10-12 | Discharge: 2022-10-12 | Disposition: A | Discharge status: Home / Self Care | Payer: Medicare Other | Attending: Urology | Admitting: Urology

## 2022-10-12 ENCOUNTER — Encounter
Admission: RE | Disposition: A | Discharge status: Home / Self Care | Payer: Self-pay | Source: Home / Self Care | Attending: Urology

## 2022-10-12 ENCOUNTER — Other Ambulatory Visit: Payer: Self-pay

## 2022-10-12 ENCOUNTER — Encounter: Payer: Self-pay | Admitting: Urology

## 2022-10-12 ENCOUNTER — Ambulatory Visit: Payer: Medicare Other | Admitting: Urgent Care

## 2022-10-12 DIAGNOSIS — N401 Enlarged prostate with lower urinary tract symptoms: Secondary | ICD-10-CM | POA: Diagnosis not present

## 2022-10-12 DIAGNOSIS — Z87891 Personal history of nicotine dependence: Secondary | ICD-10-CM | POA: Insufficient documentation

## 2022-10-12 DIAGNOSIS — N320 Bladder-neck obstruction: Secondary | ICD-10-CM | POA: Insufficient documentation

## 2022-10-12 DIAGNOSIS — M3500 Sicca syndrome, unspecified: Secondary | ICD-10-CM | POA: Diagnosis not present

## 2022-10-12 DIAGNOSIS — I252 Old myocardial infarction: Secondary | ICD-10-CM | POA: Diagnosis not present

## 2022-10-12 DIAGNOSIS — Z7902 Long term (current) use of antithrombotics/antiplatelets: Secondary | ICD-10-CM | POA: Diagnosis not present

## 2022-10-12 DIAGNOSIS — I255 Ischemic cardiomyopathy: Secondary | ICD-10-CM | POA: Diagnosis not present

## 2022-10-12 DIAGNOSIS — I110 Hypertensive heart disease with heart failure: Secondary | ICD-10-CM | POA: Insufficient documentation

## 2022-10-12 DIAGNOSIS — Z951 Presence of aortocoronary bypass graft: Secondary | ICD-10-CM | POA: Insufficient documentation

## 2022-10-12 DIAGNOSIS — N3281 Overactive bladder: Secondary | ICD-10-CM | POA: Diagnosis not present

## 2022-10-12 DIAGNOSIS — I2510 Atherosclerotic heart disease of native coronary artery without angina pectoris: Secondary | ICD-10-CM | POA: Insufficient documentation

## 2022-10-12 DIAGNOSIS — J849 Interstitial pulmonary disease, unspecified: Secondary | ICD-10-CM | POA: Insufficient documentation

## 2022-10-12 DIAGNOSIS — I509 Heart failure, unspecified: Secondary | ICD-10-CM | POA: Insufficient documentation

## 2022-10-12 DIAGNOSIS — N138 Other obstructive and reflux uropathy: Secondary | ICD-10-CM | POA: Diagnosis not present

## 2022-10-12 HISTORY — PX: HOLEP-LASER ENUCLEATION OF THE PROSTATE WITH MORCELLATION: SHX6641

## 2022-10-12 SURGERY — ENUCLEATION, PROSTATE, USING LASER, WITH MORCELLATION
Anesthesia: General | Site: Prostate

## 2022-10-12 MED ORDER — LACTATED RINGERS IV SOLN: INTRAVENOUS | Status: DC

## 2022-10-12 MED ORDER — LIDOCAINE HCL (CARDIAC) PF 100 MG/5ML IV SOSY
PREFILLED_SYRINGE | INTRAVENOUS | Status: DC | PRN
  Administered 2022-10-12: 60 mg via INTRAVENOUS

## 2022-10-12 MED ORDER — PROPOFOL 10 MG/ML IV BOLUS
INTRAVENOUS | Status: AC
  Filled 2022-10-12: qty 20

## 2022-10-12 MED ORDER — FENTANYL CITRATE (PF) 100 MCG/2ML IJ SOLN
INTRAMUSCULAR | Status: AC
  Filled 2022-10-12: qty 2

## 2022-10-12 MED ORDER — EPHEDRINE 5 MG/ML INJ
INTRAVENOUS | Status: AC
  Filled 2022-10-12: qty 5

## 2022-10-12 MED ORDER — ESMOLOL HCL 100 MG/10ML IV SOLN
INTRAVENOUS | Status: DC | PRN
Start: 2022-10-12 — End: 2022-10-12
  Administered 2022-10-12: 20 mg via INTRAVENOUS
  Administered 2022-10-12: 10 mg via INTRAVENOUS

## 2022-10-12 MED ORDER — PHENYLEPHRINE 80 MCG/ML (10ML) SYRINGE FOR IV PUSH (FOR BLOOD PRESSURE SUPPORT)
PREFILLED_SYRINGE | INTRAVENOUS | Status: AC
  Filled 2022-10-12: qty 20

## 2022-10-12 MED ORDER — OXYBUTYNIN CHLORIDE 5 MG PO TABS: 5.0000 mg | ORAL_TABLET | Freq: Three times a day (TID) | ORAL | 0 refills | Status: DC | PRN

## 2022-10-12 MED ORDER — FUROSEMIDE 10 MG/ML IJ SOLN
INTRAMUSCULAR | Status: DC | PRN
  Administered 2022-10-12: 10 mg via INTRAMUSCULAR

## 2022-10-12 MED ORDER — DEXAMETHASONE SODIUM PHOSPHATE 10 MG/ML IJ SOLN
INTRAMUSCULAR | Status: AC
  Filled 2022-10-12: qty 1

## 2022-10-12 MED ORDER — EPHEDRINE SULFATE (PRESSORS) 50 MG/ML IJ SOLN
INTRAMUSCULAR | Status: DC | PRN
  Administered 2022-10-12: 10 mg via INTRAVENOUS

## 2022-10-12 MED ORDER — CEFAZOLIN SODIUM-DEXTROSE 2-4 GM/100ML-% IV SOLN
INTRAVENOUS | Status: AC
  Filled 2022-10-12: qty 100

## 2022-10-12 MED ORDER — CHLORHEXIDINE GLUCONATE 0.12 % MT SOLN
OROMUCOSAL | Status: AC
  Filled 2022-10-12: qty 15

## 2022-10-12 MED ORDER — PHENYLEPHRINE HCL (PRESSORS) 10 MG/ML IV SOLN
INTRAVENOUS | Status: DC | PRN
  Administered 2022-10-12: 160 ug via INTRAVENOUS
  Administered 2022-10-12: 80 ug via INTRAVENOUS

## 2022-10-12 MED ORDER — ROCURONIUM BROMIDE 10 MG/ML (PF) SYRINGE
PREFILLED_SYRINGE | INTRAVENOUS | Status: AC
  Filled 2022-10-12: qty 10

## 2022-10-12 MED ORDER — FAMOTIDINE 20 MG PO TABS
ORAL_TABLET | ORAL | Status: AC
  Filled 2022-10-12: qty 1

## 2022-10-12 MED ORDER — OXYCODONE HCL 5 MG PO TABS
5.0000 mg | ORAL_TABLET | Freq: Once | ORAL | Status: AC | PRN
  Administered 2022-10-12: 5 mg via ORAL

## 2022-10-12 MED ORDER — ONDANSETRON HCL 4 MG/2ML IJ SOLN: 4.0000 mg | Freq: Once | INTRAMUSCULAR | Status: DC | PRN

## 2022-10-12 MED ORDER — SODIUM CHLORIDE 0.9 % IR SOLN
Status: DC | PRN
  Administered 2022-10-12 (×14): 3000 mL

## 2022-10-12 MED ORDER — FENTANYL CITRATE (PF) 100 MCG/2ML IJ SOLN
25.0000 ug | INTRAMUSCULAR | Status: AC | PRN
  Administered 2022-10-12 (×6): 25 ug via INTRAVENOUS

## 2022-10-12 MED ORDER — HYDROCODONE-ACETAMINOPHEN 5-325 MG PO TABS
1.0000 | ORAL_TABLET | Freq: Four times a day (QID) | ORAL | 0 refills | Status: DC | PRN
Start: 2022-10-12 — End: 2023-02-16

## 2022-10-12 MED ORDER — ROCURONIUM BROMIDE 100 MG/10ML IV SOLN
INTRAVENOUS | Status: DC | PRN
  Administered 2022-10-12: 20 mg via INTRAVENOUS
  Administered 2022-10-12: 30 mg via INTRAVENOUS
  Administered 2022-10-12: 10 mg via INTRAVENOUS
  Administered 2022-10-12: 20 mg via INTRAVENOUS

## 2022-10-12 MED ORDER — SUGAMMADEX SODIUM 200 MG/2ML IV SOLN
INTRAVENOUS | Status: DC | PRN
  Administered 2022-10-12: 200 mg via INTRAVENOUS

## 2022-10-12 MED ORDER — OXYCODONE HCL 5 MG PO TABS
ORAL_TABLET | ORAL | Status: AC
  Filled 2022-10-12: qty 1

## 2022-10-12 MED ORDER — FENTANYL CITRATE (PF) 100 MCG/2ML IJ SOLN
INTRAMUSCULAR | Status: DC | PRN
  Administered 2022-10-12: 50 ug via INTRAVENOUS
  Administered 2022-10-12 (×4): 25 ug via INTRAVENOUS

## 2022-10-12 MED ORDER — PROPOFOL 10 MG/ML IV BOLUS
INTRAVENOUS | Status: DC | PRN
Start: 2022-10-12 — End: 2022-10-12
  Administered 2022-10-12: 30 mg via INTRAVENOUS
  Administered 2022-10-12: 20 mg via INTRAVENOUS
  Administered 2022-10-12: 80 mg via INTRAVENOUS

## 2022-10-12 MED ORDER — ONDANSETRON HCL 4 MG/2ML IJ SOLN
INTRAMUSCULAR | Status: AC
  Filled 2022-10-12: qty 2

## 2022-10-12 MED ORDER — LIDOCAINE HCL (PF) 2 % IJ SOLN
INTRAMUSCULAR | Status: AC
  Filled 2022-10-12: qty 5

## 2022-10-12 MED ORDER — ACETAMINOPHEN 10 MG/ML IV SOLN: 15.0000 mg/kg | Freq: Once | INTRAVENOUS | Status: DC | PRN

## 2022-10-12 MED ORDER — DEXAMETHASONE SODIUM PHOSPHATE 10 MG/ML IJ SOLN
INTRAMUSCULAR | Status: DC | PRN
  Administered 2022-10-12: 10 mg via INTRAVENOUS

## 2022-10-12 MED ORDER — OXYCODONE HCL 5 MG/5ML PO SOLN: 5.0000 mg | Freq: Once | ORAL | Status: AC | PRN

## 2022-10-12 MED ORDER — STERILE WATER FOR IRRIGATION IR SOLN
Status: DC | PRN
  Administered 2022-10-12: 1000 mL

## 2022-10-12 MED ORDER — ONDANSETRON HCL 4 MG/2ML IJ SOLN
INTRAMUSCULAR | Status: DC | PRN
Start: 2022-10-12 — End: 2022-10-12
  Administered 2022-10-12: 4 mg via INTRAVENOUS

## 2022-10-12 SURGICAL SUPPLY — 36 items
ADAPTER IRRIG TUBE 2 SPIKE SOL (ADAPTER) ×2 IMPLANT
ADPR TBG 2 SPK PMP STRL ASCP (ADAPTER) ×2
BAG DRN LRG CPC RND TRDRP CNTR (MISCELLANEOUS)
BAG DRN RND TRDRP ANRFLXCHMBR (UROLOGICAL SUPPLIES)
BAG URINE DRAIN 2000ML AR STRL (UROLOGICAL SUPPLIES) IMPLANT
BAG URO DRAIN 4000ML (MISCELLANEOUS) IMPLANT
CATH FOL 2WAY LX 20X30 (CATHETERS) IMPLANT
CATH FOL 2WAY LX 22X30 (CATHETERS) IMPLANT
CATH FOLEY 3WAY 30CC 22FR (CATHETERS) IMPLANT
CATH URETL OPEN END 4X70 (CATHETERS) ×1 IMPLANT
CONTAINER COLLECT MORCELLATR (MISCELLANEOUS) ×1 IMPLANT
DRAPE 3/4 80X56 (DRAPES) ×1 IMPLANT
DRAPE UTILITY 15X26 TOWEL STRL (DRAPES) IMPLANT
FIBER LASER MOSES 550 DFL (Laser) ×1 IMPLANT
FILTER OVERFLOW MORCELLATOR (FILTER) ×1 IMPLANT
GLOVE BIO SURGEON STRL SZ 6.5 (GLOVE) ×2 IMPLANT
GOWN STRL REUS W/ TWL LRG LVL3 (GOWN DISPOSABLE) ×2 IMPLANT
GOWN STRL REUS W/TWL LRG LVL3 (GOWN DISPOSABLE) ×2
HOLDER FOLEY CATH W/STRAP (MISCELLANEOUS) ×1 IMPLANT
IV NS IRRIG 3000ML ARTHROMATIC (IV SOLUTION) ×4 IMPLANT
KIT TURNOVER CYSTO (KITS) ×1 IMPLANT
MBRN O SEALING YLW 17 FOR INST (MISCELLANEOUS) ×1 IMPLANT
MEMBRANE SLNG YLW 17 FOR INST (MISCELLANEOUS) ×1 IMPLANT
MORCELLATOR COLLECT CONTAINER (MISCELLANEOUS) ×1 IMPLANT
MORCELLATOR OVERFLOW FILTER (FILTER) ×1 IMPLANT
MORCELLATOR ROTATION 4.75 335 (MISCELLANEOUS) ×1 IMPLANT
PACK CYSTO AR (MISCELLANEOUS) ×1 IMPLANT
SET CYSTO W/LG BORE CLAMP LF (SET/KITS/TRAYS/PACK) IMPLANT
SET IRRIG Y TYPE TUR BLADDER L (SET/KITS/TRAYS/PACK) ×1 IMPLANT
SLEEVE PROTECTION STRL DISP (MISCELLANEOUS) ×2 IMPLANT
SURGILUBE 2OZ TUBE FLIPTOP (MISCELLANEOUS) ×1 IMPLANT
SYR TOOMEY IRRIG 70ML (MISCELLANEOUS) ×1 IMPLANT
SYRINGE TOOMEY IRRIG 70ML (MISCELLANEOUS) ×1 IMPLANT
TUBE PUMP MORCELLATOR PIRANHA (TUBING) ×1 IMPLANT
WATER STERILE IRR 1000ML POUR (IV SOLUTION) ×1 IMPLANT
WATER STERILE IRR 500ML POUR (IV SOLUTION) ×1 IMPLANT

## 2022-10-12 NOTE — Anesthesia Procedure Notes (Signed)
Procedure Name: Intubation Date/Time: 10/12/2022 9:32 AM  Performed by: Emeterio Reeve, CRNAPre-anesthesia Checklist: Patient identified, Emergency Drugs available, Suction available and Patient being monitored Patient Re-evaluated:Patient Re-evaluated prior to induction Oxygen Delivery Method: Circle system utilized Preoxygenation: Pre-oxygenation with 100% oxygen Induction Type: IV induction Ventilation: Mask ventilation without difficulty Laryngoscope Size: Mac, McGraph and 4 Grade View: Grade I Tube type: Oral Tube size: 7.5 mm Number of attempts: 1 Airway Equipment and Method: Stylet and Oral airway Placement Confirmation: ETT inserted through vocal cords under direct vision, positive ETCO2 and breath sounds checked- equal and bilateral Secured at: 22 cm Tube secured with: Tape Dental Injury: Teeth and Oropharynx as per pre-operative assessment

## 2022-10-12 NOTE — Anesthesia Postprocedure Evaluation (Signed)
Anesthesia Post Note  Patient: Nicholas Alvarez  Procedure(s) Performed: HOLEP-LASER ENUCLEATION OF THE PROSTATE WITH MORCELLATION (Prostate)  Patient location during evaluation: PACU Anesthesia Type: General Level of consciousness: awake and alert, oriented and patient cooperative Pain management: pain level controlled Vital Signs Assessment: post-procedure vital signs reviewed and stable Respiratory status: spontaneous breathing, nonlabored ventilation and respiratory function stable Cardiovascular status: blood pressure returned to baseline and stable Postop Assessment: adequate PO intake Anesthetic complications: no   No notable events documented.   Last Vitals:  Vitals:   10/12/22 1215 10/12/22 1230  BP: 118/73 132/85  Pulse: 76   Resp: 15 12  Temp:    SpO2: 100%     Last Pain:  Vitals:   10/12/22 1230  TempSrc:   PainSc: 0-No pain                 Reed Breech

## 2022-10-12 NOTE — Progress Notes (Signed)
Foley irrigation and emptying education provided to patient and spouse; expressed understanding. Supplies and leg bag sent with patient. Pt. d/c to private vehicle in wheelchair with spouse.

## 2022-10-12 NOTE — Discharge Instructions (Signed)

## 2022-10-12 NOTE — Op Note (Signed)
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  Trabulated bladder.  Hypervascular prostatic mucosa  EBL: 100 cc  Specimens: prostate chips  Drains: 20 Fr 2 day foley with 50 cc balloon  Indication: Nicholas Alvarez is a 82 y.o. patient with refractory OAB.  After reviewing the management options for treatment, he elected to proceed with the above surgical procedure(s). We have discussed the potential benefits and risks of the procedure, side effects of the proposed treatment, the likelihood of the patient achieving the goals of the procedure, and any potential problems that might occur during the procedure or recuperation. Informed consent has been obtained.  Description of procedure:  The patient was taken to the operating room and general anesthesia was induced.  The patient was placed in the dorsal lithotomy position, prepped and draped in the usual sterile fashion, and preoperative antibiotics were administered. A preoperative time-out was performed.     A 26 French resectoscope sheath using a blunt angled obturator was introduced without difficulty into the bladder.  The bladder was carefully inspected and noted to be moderately trabeculated.  There is an elevated bladder neck with a very small intravesical component.   The median lobe was fused to the right lateral lobe. The trigone was able to be visualized with some manipulation and the UOs were good distance bladder neck itself.  The prostatic fossa had significant trilobar coaptation with greater than 5 cm prostatic length.  A 550 m laser fiber was then brought in and using settings of 2 J's and 60 Hz, a incision was created at the 5:00  position of the bladder neck.  The incision  was carried down caudally meeting in the midline just above the verumontanum.  Left lateral lobe was then enucleated from a caudal to cranial direction cleaving the adenoma off the underlying capsule rolling it towards the bladder neck and ultimately cleaving the mucosa to free the  lobe into the bladder.   Next, a semilunar incision was created at the prostatic apex on the left side again freeing up the adenoma from the underlying capsule.  The fused median lobe was enucleated in block with the right lateral lobe.  Care was taken to avoid any resection past the verumontanum.  This incision was carried around laterally and cranially towards the bladder neck.  Ultimately, I was able to complete the anterior commissure mucosa and the adenoma into the bladder creating a widely patent prostatic fossa.       Hemostasis was achieved using hemostatic fiber settings.  Bilateral UOs were visualized and free of any injury.  Finally, the 34 French resectoscope was exchanged for nephroscope and using the Piranha handpiece morcellator, the bladder was distended in each of the prostate chips were evacuated.  The bladder was irrigated several times and smaller chips were clear for the bladder.  This point time, there were no residual fibers appreciated in the bladder.  Hemostasis was adequate.  10 mg of IV Lasix was administered to help with postoperative diuresis.  A 20 French two-way Foley catheter was then inserted over a catheter guide with 50 cc in the balloon.  The catheter irrigated easily and well.  Patient was then clean and dry, repositioned supine position, reversed from anesthesia, taken to PACU in stable condition.  Plan: Patient will return to the office in 7 days for voiding trial.      Vanna Scotland, M.D.

## 2022-10-12 NOTE — Transfer of Care (Signed)
Immediate Anesthesia Transfer of Care Note  Patient: Nicholas Alvarez  Procedure(s) Performed: HOLEP-LASER ENUCLEATION OF THE PROSTATE WITH MORCELLATION (Prostate)  Patient Location: PACU  Anesthesia Type:General  Level of Consciousness: awake and drowsy  Airway & Oxygen Therapy: Patient Spontanous Breathing and Patient connected to face mask oxygen  Post-op Assessment: Report given to RN and Post -op Vital signs reviewed and stable  Post vital signs: Reviewed and stable  Last Vitals:  Vitals Value Taken Time  BP 142/67   Temp    Pulse 91   Resp 14   SpO2 100     Last Pain:  Vitals:   10/12/22 0756  TempSrc: Temporal  PainSc: 0-No pain         Complications: No notable events documented.

## 2022-10-12 NOTE — Progress Notes (Signed)
Patient's Foley catheter drainage is tenuous.  Irrigates inconsistently.  Such, his Foley catheter was removed and replaced using standard sterile technique with a 22 French hematuria catheter.  This irrigated well with return of pink urine.  There were no clots noted.  Appropriate for discharge.  Vanna Scotland, MD

## 2022-10-12 NOTE — Anesthesia Preprocedure Evaluation (Signed)
Anesthesia Evaluation  Patient identified by MRN, date of birth, ID band Patient awake    Reviewed: Allergy & Precautions, NPO status , Patient's Chart, lab work & pertinent test results  History of Anesthesia Complications Negative for: history of anesthetic complications  Airway Mallampati: III   Neck ROM: Full    Dental  (+) Missing, Chipped   Pulmonary shortness of breath (2/2 interstitial lung disease, stable) and with exertion, former smoker (quit greater than 20 years ago)   Pulmonary exam normal breath sounds clear to auscultation       Cardiovascular hypertension, + CAD (s/p MI and CABG 2010 on Plavix) and +CHF (ischemic cardiomyopathy, EF 35%)  Normal cardiovascular exam Rhythm:Regular Rate:Normal  ECG 10/06/22: normal   Neuro/Psych  PSYCHIATRIC DISORDERS  Depression    negative neurological ROS     GI/Hepatic Malnutrition    Endo/Other  negative endocrine ROS    Renal/GU negative Renal ROS     Musculoskeletal Sjogren syndrome   Abdominal   Peds  Hematology  (+) Blood dyscrasia, anemia   Anesthesia Other Findings Internal medicine note 09/21/22:  Impression/Plan  Wellness exam  I discussed the importance of a healthy lifestyle including appropriate food choices and regular exercise. I recommended a diet that is moderate in fiber with fresh fruits and vegetables and low in saturated fats and simple carbohydrates. I recommended getting exercise based on patient's exercise capacity ideally 150 minutes per week. The patient is up to date on age appropriate screening exams and vaccines.  The patient was given a copy of the prevention plan.  Flu vaccine annually Pneumonia vaccine once after age 28 Shingles vaccine once after age 19 Colonoscopy every 10 years until age 15  MDD: There has been some confusion about his medicine. I would like him to be taking duloxetine 60 mg along with mirtazapine. He can stop  the lexapro.  HTN: He has been taking coreg 25 mg BID. I discussed going to 12.5 mg BID.  Chronic anemia: He does notice some blood in his urine.    Reproductive/Obstetrics                             Anesthesia Physical Anesthesia Plan  ASA: 3  Anesthesia Plan: General   Post-op Pain Management:    Induction: Intravenous  PONV Risk Score and Plan: 2 and Ondansetron, Dexamethasone and Treatment may vary due to age or medical condition  Airway Management Planned: Oral ETT  Additional Equipment:   Intra-op Plan:   Post-operative Plan: Extubation in OR  Informed Consent: I have reviewed the patients History and Physical, chart, labs and discussed the procedure including the risks, benefits and alternatives for the proposed anesthesia with the patient or authorized representative who has indicated his/her understanding and acceptance.     Dental advisory given  Plan Discussed with: CRNA  Anesthesia Plan Comments: (Patient consented for risks of anesthesia including but not limited to:  - adverse reactions to medications - damage to eyes, teeth, lips or other oral mucosa - nerve damage due to positioning  - sore throat or hoarseness - damage to heart, brain, nerves, lungs, other parts of body or loss of life  Informed patient about role of CRNA in peri- and intra-operative care.  Patient voiced understanding.)       Anesthesia Quick Evaluation

## 2022-10-12 NOTE — Interval H&P Note (Signed)
History and Physical Interval Note:  10/12/2022 9:16 AM  Nicholas Alvarez  has presented today for surgery, with the diagnosis of Benign Prostatic Hyperplasia with Urinary Obstruction.  The various methods of treatment have been discussed with the patient and family. After consideration of risks, benefits and other options for treatment, the patient has consented to  Procedure(s): HOLEP-LASER ENUCLEATION OF THE PROSTATE WITH MORCELLATION (N/A) as a surgical intervention.  The patient's history has been reviewed, patient examined, no change in status, stable for surgery.  I have reviewed the patient's chart and labs.  Questions were answered to the patient's satisfaction.    RRR CTAB  Past Medical History:  Diagnosis Date   Acute hypoxemic respiratory failure (HCC)    BPH (benign prostatic hyperplasia)    Bronchiectasis (HCC)    CAD (coronary artery disease)    Post anterior wall myocardial infarction and 5-vessel coronary bypass    Chronic anemia    COVID-19    Depression    Dyslipidemia    Dyspnea    ED (erectile dysfunction)    Elevated PSA    Falls    Gross hematuria    History of methicillin resistant staphylococcus aureus (MRSA) 02/2022   + PCR nasal swab   Hx of hypotension    was placed on midodrine and then taken off   Hypertension    Interstitial lung disease (HCC)    Ischemic cardiomyopathy    with left ventricular ejection fraction of 35%   Left ventricular aneurysm    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   clopidogrel (PLAVIX) 75 MG tablet Take 75 mg by mouth  daily.   cyanocobalamin (VITAMIN B12) 1000 MCG tablet Take 1,000 mcg by mouth daily.   ethambutol (MYAMBUTOL) 400 MG tablet Take 1,800 mg by mouth 3 (three) times a week. M, W, F-takes 4.5 tablets   ferrous sulfate 325 (65 FE) MG tablet Take 325 mg by mouth 3 (three) times a week.   finasteride (PROSCAR) 5 MG tablet Take 5 mg by mouth every morning.   Melatonin 10 MG TABS Take 10 mg by mouth at bedtime.   mirtazapine (REMERON) 15 MG tablet Take 1 tablet by mouth at bedtime.   mycophenolate (CELLCEPT) 500 MG tablet Take 500 mg by mouth every morning.   NON FORMULARY Take 1 tablet by mouth daily at 6 (six) AM. Brain Function Supplement   rifampin (RIFADIN) 300 MG capsule Take 600 mg by mouth 3 (three) times a week. M, W, F   simvastatin (ZOCOR) 40 MG tablet Take 40 mg by mouth every morning.   tamsulosin (FLOMAX) 0.4 MG CAPS capsule Take 0.4 mg by mouth daily after breakfast.   Vibegron (GEMTESA) 75 MG TABS Take 1 tablet (75 mg total) by mouth daily. (Patient taking differently: Take 75 mg by mouth every morning.)   Allergies  Allergen Reactions   Ace Inhibitors Cough     Vanna Scotland

## 2022-10-13 ENCOUNTER — Encounter: Payer: Self-pay | Admitting: Urology

## 2022-10-19 NOTE — Progress Notes (Unsigned)
Catheter Removal  He underwent HoLEP on October 12, 2022.  Postprocedural course was as expected and uneventful.  Pathology from prostate chips negative for cancer.   Patient is present today for a catheter removal.  50 ml of water was drained from the balloon. A 20 three wayFR foley cath was removed from the bladder, no complications were noted. Patient tolerated well.  Performed by: Michiel Cowboy, PA-C   Follow up/ Additional notes: Given post HoLEP expectations and return to clinic instructions in AVS.  Encouraged patient to challenge his bladder by drinking increased volume of fluids.  Will return this afternoon for PVR.  He was able to void freely since the catheter has been removed.  His bladder scan noted a residual 33 cc.      Return in about 1 month (around 11/20/2022) for IPSS and PVR.

## 2022-10-20 ENCOUNTER — Ambulatory Visit: Payer: Medicare Other | Admitting: Urology

## 2022-10-20 ENCOUNTER — Encounter: Payer: Self-pay | Admitting: Urology

## 2022-10-20 VITALS — BP 89/57 | HR 87 | Ht 70.0 in | Wt 130.2 lb

## 2022-10-20 DIAGNOSIS — N401 Enlarged prostate with lower urinary tract symptoms: Secondary | ICD-10-CM | POA: Diagnosis not present

## 2022-10-20 DIAGNOSIS — N138 Other obstructive and reflux uropathy: Secondary | ICD-10-CM | POA: Diagnosis not present

## 2022-10-20 LAB — BLADDER SCAN AMB NON-IMAGING

## 2022-10-20 NOTE — Patient Instructions (Signed)

## 2022-10-30 ENCOUNTER — Other Ambulatory Visit: Payer: Self-pay

## 2022-10-30 ENCOUNTER — Emergency Department: Payer: Medicare Other

## 2022-10-30 ENCOUNTER — Inpatient Hospital Stay
Admission: EM | Admit: 2022-10-30 | Discharge: 2022-11-03 | DRG: 871 | Disposition: A | Discharge status: Home Health | Payer: Medicare Other | Attending: Osteopathic Medicine | Admitting: Osteopathic Medicine

## 2022-10-30 ENCOUNTER — Encounter: Payer: Self-pay | Admitting: Radiology

## 2022-10-30 ENCOUNTER — Inpatient Hospital Stay: Payer: Medicare Other

## 2022-10-30 DIAGNOSIS — I110 Hypertensive heart disease with heart failure: Secondary | ICD-10-CM | POA: Diagnosis present

## 2022-10-30 DIAGNOSIS — J479 Bronchiectasis, uncomplicated: Secondary | ICD-10-CM

## 2022-10-30 DIAGNOSIS — D509 Iron deficiency anemia, unspecified: Secondary | ICD-10-CM | POA: Diagnosis present

## 2022-10-30 DIAGNOSIS — J159 Unspecified bacterial pneumonia: Secondary | ICD-10-CM | POA: Diagnosis present

## 2022-10-30 DIAGNOSIS — Z681 Body mass index (BMI) 19 or less, adult: Secondary | ICD-10-CM

## 2022-10-30 DIAGNOSIS — Z951 Presence of aortocoronary bypass graft: Secondary | ICD-10-CM

## 2022-10-30 DIAGNOSIS — E43 Unspecified severe protein-calorie malnutrition: Secondary | ICD-10-CM | POA: Diagnosis present

## 2022-10-30 DIAGNOSIS — J189 Pneumonia, unspecified organism: Secondary | ICD-10-CM | POA: Diagnosis not present

## 2022-10-30 DIAGNOSIS — I5022 Chronic systolic (congestive) heart failure: Secondary | ICD-10-CM | POA: Diagnosis present

## 2022-10-30 DIAGNOSIS — Z1152 Encounter for screening for COVID-19: Secondary | ICD-10-CM

## 2022-10-30 DIAGNOSIS — Z8249 Family history of ischemic heart disease and other diseases of the circulatory system: Secondary | ICD-10-CM

## 2022-10-30 DIAGNOSIS — F419 Anxiety disorder, unspecified: Secondary | ICD-10-CM | POA: Diagnosis present

## 2022-10-30 DIAGNOSIS — Z8616 Personal history of COVID-19: Secondary | ICD-10-CM | POA: Diagnosis not present

## 2022-10-30 DIAGNOSIS — M6281 Muscle weakness (generalized): Secondary | ICD-10-CM | POA: Diagnosis present

## 2022-10-30 DIAGNOSIS — I2510 Atherosclerotic heart disease of native coronary artery without angina pectoris: Secondary | ICD-10-CM | POA: Diagnosis present

## 2022-10-30 DIAGNOSIS — R32 Unspecified urinary incontinence: Secondary | ICD-10-CM | POA: Diagnosis present

## 2022-10-30 DIAGNOSIS — Z7902 Long term (current) use of antithrombotics/antiplatelets: Secondary | ICD-10-CM

## 2022-10-30 DIAGNOSIS — Z66 Do not resuscitate: Secondary | ICD-10-CM | POA: Diagnosis present

## 2022-10-30 DIAGNOSIS — Z87891 Personal history of nicotine dependence: Secondary | ICD-10-CM

## 2022-10-30 DIAGNOSIS — I252 Old myocardial infarction: Secondary | ICD-10-CM

## 2022-10-30 DIAGNOSIS — F32A Depression, unspecified: Secondary | ICD-10-CM | POA: Diagnosis present

## 2022-10-30 DIAGNOSIS — N400 Enlarged prostate without lower urinary tract symptoms: Secondary | ICD-10-CM | POA: Diagnosis present

## 2022-10-30 DIAGNOSIS — J470 Bronchiectasis with acute lower respiratory infection: Secondary | ICD-10-CM | POA: Diagnosis present

## 2022-10-30 DIAGNOSIS — A419 Sepsis, unspecified organism: Principal | ICD-10-CM | POA: Diagnosis present

## 2022-10-30 DIAGNOSIS — A310 Pulmonary mycobacterial infection: Secondary | ICD-10-CM | POA: Diagnosis present

## 2022-10-30 DIAGNOSIS — R6520 Severe sepsis without septic shock: Secondary | ICD-10-CM | POA: Diagnosis present

## 2022-10-30 DIAGNOSIS — I2694 Multiple subsegmental pulmonary emboli without acute cor pulmonale: Secondary | ICD-10-CM | POA: Diagnosis present

## 2022-10-30 DIAGNOSIS — M3500 Sicca syndrome, unspecified: Secondary | ICD-10-CM | POA: Diagnosis present

## 2022-10-30 DIAGNOSIS — Z792 Long term (current) use of antibiotics: Secondary | ICD-10-CM

## 2022-10-30 DIAGNOSIS — Z515 Encounter for palliative care: Secondary | ICD-10-CM | POA: Diagnosis not present

## 2022-10-30 DIAGNOSIS — R6521 Severe sepsis with septic shock: Secondary | ICD-10-CM | POA: Diagnosis present

## 2022-10-30 DIAGNOSIS — J9621 Acute and chronic respiratory failure with hypoxia: Secondary | ICD-10-CM | POA: Diagnosis present

## 2022-10-30 DIAGNOSIS — N529 Male erectile dysfunction, unspecified: Secondary | ICD-10-CM | POA: Diagnosis present

## 2022-10-30 DIAGNOSIS — I255 Ischemic cardiomyopathy: Secondary | ICD-10-CM | POA: Diagnosis present

## 2022-10-30 DIAGNOSIS — Z7982 Long term (current) use of aspirin: Secondary | ICD-10-CM

## 2022-10-30 DIAGNOSIS — Z888 Allergy status to other drugs, medicaments and biological substances status: Secondary | ICD-10-CM

## 2022-10-30 DIAGNOSIS — I2609 Other pulmonary embolism with acute cor pulmonale: Secondary | ICD-10-CM | POA: Diagnosis not present

## 2022-10-30 DIAGNOSIS — E785 Hyperlipidemia, unspecified: Secondary | ICD-10-CM | POA: Diagnosis present

## 2022-10-30 DIAGNOSIS — Z79624 Long term (current) use of inhibitors of nucleotide synthesis: Secondary | ICD-10-CM

## 2022-10-30 DIAGNOSIS — Z825 Family history of asthma and other chronic lower respiratory diseases: Secondary | ICD-10-CM

## 2022-10-30 DIAGNOSIS — Z79899 Other long term (current) drug therapy: Secondary | ICD-10-CM

## 2022-10-30 LAB — CBC WITH DIFFERENTIAL/PLATELET
Abs Immature Granulocytes: 0.14 10*3/uL — ABNORMAL HIGH (ref 0.00–0.07)
Basophils Absolute: 0.1 10*3/uL (ref 0.0–0.1)
Basophils Relative: 0 %
Eosinophils Absolute: 0.1 10*3/uL (ref 0.0–0.5)
Eosinophils Relative: 0 %
HCT: 30.2 % — ABNORMAL LOW (ref 39.0–52.0)
Hemoglobin: 9.1 g/dL — ABNORMAL LOW (ref 13.0–17.0)
Immature Granulocytes: 1 %
Lymphocytes Relative: 4 %
Lymphs Abs: 0.7 10*3/uL (ref 0.7–4.0)
MCH: 26 pg (ref 26.0–34.0)
MCHC: 30.1 g/dL (ref 30.0–36.0)
MCV: 86.3 fL (ref 80.0–100.0)
Monocytes Absolute: 1.1 10*3/uL — ABNORMAL HIGH (ref 0.1–1.0)
Monocytes Relative: 6 %
Neutro Abs: 16.7 10*3/uL — ABNORMAL HIGH (ref 1.7–7.7)
Neutrophils Relative %: 89 %
Platelets: 518 10*3/uL — ABNORMAL HIGH (ref 150–400)
RBC: 3.5 MIL/uL — ABNORMAL LOW (ref 4.22–5.81)
RDW: 15 % (ref 11.5–15.5)
WBC: 18.8 10*3/uL — ABNORMAL HIGH (ref 4.0–10.5)
nRBC: 0 % (ref 0.0–0.2)

## 2022-10-30 LAB — APTT: aPTT: 35 seconds (ref 24–36)

## 2022-10-30 LAB — COMPREHENSIVE METABOLIC PANEL
ALT: 11 U/L (ref 0–44)
AST: 23 U/L (ref 15–41)
Albumin: 2.7 g/dL — ABNORMAL LOW (ref 3.5–5.0)
Alkaline Phosphatase: 67 U/L (ref 38–126)
Anion gap: 10 (ref 5–15)
BUN: 28 mg/dL — ABNORMAL HIGH (ref 8–23)
CO2: 23 mmol/L (ref 22–32)
Calcium: 9.4 mg/dL (ref 8.9–10.3)
Chloride: 104 mmol/L (ref 98–111)
Creatinine, Ser: 1.03 mg/dL (ref 0.61–1.24)
GFR, Estimated: >60 mL/min (ref >60)
Glucose, Bld: 143 mg/dL — ABNORMAL HIGH (ref 70–99)
Potassium: 3.9 mmol/L (ref 3.5–5.1)
Sodium: 137 mmol/L (ref 135–145)
Total Bilirubin: 0.3 mg/dL (ref 0.3–1.2)
Total Protein: 7.3 g/dL (ref 6.5–8.1)

## 2022-10-30 LAB — RESP PANEL BY RT-PCR (RSV, FLU A&B, COVID)  RVPGX2
Influenza A by PCR: NEGATIVE
Influenza B by PCR: NEGATIVE
Resp Syncytial Virus by PCR: NEGATIVE
SARS Coronavirus 2 by RT PCR: NEGATIVE

## 2022-10-30 LAB — RETICULOCYTES
Immature Retic Fract: 12.7 % (ref 2.3–15.9)
RBC.: 3.48 MIL/uL — ABNORMAL LOW (ref 4.22–5.81)
Retic Count, Absolute: 42.3 10*3/uL (ref 19.0–186.0)
Retic Ct Pct: 1.2 % (ref 0.4–3.1)

## 2022-10-30 LAB — IRON AND TIBC
Iron: 15 ug/dL — ABNORMAL LOW (ref 45–182)
Saturation Ratios: 11 % — ABNORMAL LOW (ref 17.9–39.5)
TIBC: 134 ug/dL — ABNORMAL LOW (ref 250–450)
UIBC: 119 ug/dL

## 2022-10-30 LAB — LACTIC ACID, PLASMA
Lactic Acid, Venous: 2.3 mmol/L (ref 0.5–1.9)
Lactic Acid, Venous: 1.6 mmol/L (ref 0.5–1.9)

## 2022-10-30 LAB — TSH: TSH: 2.161 u[IU]/mL (ref 0.350–4.500)

## 2022-10-30 LAB — PROTIME-INR
INR: 1.2 (ref 0.8–1.2)
Prothrombin Time: 14.9 seconds (ref 11.4–15.2)

## 2022-10-30 LAB — MAGNESIUM: Magnesium: 2.2 mg/dL (ref 1.7–2.4)

## 2022-10-30 MED ORDER — PIPERACILLIN-TAZOBACTAM 3.375 G IVPB
3.3750 g | Freq: Three times a day (TID) | INTRAVENOUS | Status: DC
  Administered 2022-10-30 – 2022-10-31 (×2): 3.375 g via INTRAVENOUS
  Filled 2022-10-30 (×3): qty 50

## 2022-10-30 MED ORDER — MELATONIN 10 MG PO TABS: 10.0000 mg | ORAL_TABLET | Freq: Every day | ORAL | Status: DC

## 2022-10-30 MED ORDER — LACTATED RINGERS IV BOLUS
1000.0000 mL | Freq: Once | INTRAVENOUS | Status: AC
  Administered 2022-10-30: 1000 mL via INTRAVENOUS

## 2022-10-30 MED ORDER — HEPARIN BOLUS VIA INFUSION
4000.0000 [IU] | Freq: Once | INTRAVENOUS | Status: AC
  Administered 2022-10-30: 4000 [IU] via INTRAVENOUS
  Filled 2022-10-30: qty 4000

## 2022-10-30 MED ORDER — HYDROCODONE-ACETAMINOPHEN 5-325 MG PO TABS: 1.0000 | ORAL_TABLET | Freq: Four times a day (QID) | ORAL | Status: DC | PRN

## 2022-10-30 MED ORDER — LACTATED RINGERS IV SOLN: INTRAVENOUS | Status: AC

## 2022-10-30 MED ORDER — SODIUM CHLORIDE 0.9 % IV SOLN
2.0000 g | Freq: Once | INTRAVENOUS | Status: AC
  Administered 2022-10-30: 2 g via INTRAVENOUS
  Filled 2022-10-30: qty 12.5

## 2022-10-30 MED ORDER — IOHEXOL 350 MG/ML SOLN
75.0000 mL | Freq: Once | INTRAVENOUS | Status: AC | PRN
  Administered 2022-10-30: 75 mL via INTRAVENOUS

## 2022-10-30 MED ORDER — OXYBUTYNIN CHLORIDE 5 MG PO TABS: 5.0000 mg | ORAL_TABLET | Freq: Three times a day (TID) | ORAL | Status: DC | PRN

## 2022-10-30 MED ORDER — TAMSULOSIN HCL 0.4 MG PO CAPS
0.4000 mg | ORAL_CAPSULE | Freq: Every day | ORAL | Status: DC
  Administered 2022-10-31 – 2022-11-03 (×4): 0.4 mg via ORAL
  Filled 2022-10-30 (×4): qty 1

## 2022-10-30 MED ORDER — DRONABINOL 2.5 MG PO CAPS
2.5000 mg | ORAL_CAPSULE | Freq: Two times a day (BID) | ORAL | Status: DC
  Administered 2022-10-31 – 2022-11-03 (×6): 2.5 mg via ORAL
  Filled 2022-10-30 (×6): qty 1

## 2022-10-30 MED ORDER — HEPARIN (PORCINE) 25000 UT/250ML-% IV SOLN
2100.0000 [IU]/h | INTRAVENOUS | Status: DC
  Administered 2022-10-30: 950 [IU]/h via INTRAVENOUS
  Administered 2022-10-31: 1250 [IU]/h via INTRAVENOUS
  Administered 2022-10-31: 1150 [IU]/h via INTRAVENOUS
  Administered 2022-11-01 (×2): 1700 [IU]/h via INTRAVENOUS
  Administered 2022-11-02: 1900 [IU]/h via INTRAVENOUS
  Administered 2022-11-02: 2100 [IU]/h via INTRAVENOUS
  Filled 2022-10-30 (×6): qty 250

## 2022-10-30 MED ORDER — GUAIFENESIN ER 600 MG PO TB12
600.0000 mg | ORAL_TABLET | Freq: Two times a day (BID) | ORAL | Status: DC
  Administered 2022-10-30 – 2022-11-03 (×8): 600 mg via ORAL
  Filled 2022-10-30 (×8): qty 1

## 2022-10-30 MED ORDER — IPRATROPIUM-ALBUTEROL 0.5-2.5 (3) MG/3ML IN SOLN
3.0000 mL | Freq: Three times a day (TID) | RESPIRATORY_TRACT | Status: DC
  Administered 2022-10-31 – 2022-11-02 (×6): 3 mL via RESPIRATORY_TRACT
  Filled 2022-10-30 (×6): qty 3

## 2022-10-30 MED ORDER — LACTATED RINGERS IV BOLUS (SEPSIS)
1000.0000 mL | Freq: Once | INTRAVENOUS | Status: AC
  Administered 2022-10-30: 1000 mL via INTRAVENOUS

## 2022-10-30 MED ORDER — ASPIRIN 81 MG PO TBEC
81.0000 mg | DELAYED_RELEASE_TABLET | Freq: Every day | ORAL | Status: DC
  Administered 2022-10-30 – 2022-11-03 (×5): 81 mg via ORAL
  Filled 2022-10-30 (×5): qty 1

## 2022-10-30 MED ORDER — ALBUTEROL SULFATE (2.5 MG/3ML) 0.083% IN NEBU: 2.5000 mg | INHALATION_SOLUTION | Freq: Four times a day (QID) | RESPIRATORY_TRACT | Status: DC | PRN

## 2022-10-30 MED ORDER — MIRABEGRON ER 25 MG PO TB24: 25.0000 mg | ORAL_TABLET | Freq: Every day | ORAL | Status: DC

## 2022-10-30 MED ORDER — ENSURE ENLIVE PO LIQD
237.0000 mL | Freq: Two times a day (BID) | ORAL | Status: DC
  Administered 2022-10-31 – 2022-11-02 (×5): 237 mL via ORAL

## 2022-10-30 MED ORDER — MIRTAZAPINE 15 MG PO TABS
15.0000 mg | ORAL_TABLET | Freq: Every day | ORAL | Status: DC
  Administered 2022-10-30 – 2022-11-02 (×4): 15 mg via ORAL
  Filled 2022-10-30 (×4): qty 1

## 2022-10-30 MED ORDER — MYCOPHENOLATE MOFETIL 250 MG PO CAPS
500.0000 mg | ORAL_CAPSULE | ORAL | Status: DC
  Administered 2022-10-31 – 2022-11-02 (×3): 500 mg via ORAL
  Filled 2022-10-30 (×3): qty 2

## 2022-10-30 MED ORDER — FINASTERIDE 5 MG PO TABS
5.0000 mg | ORAL_TABLET | ORAL | Status: DC
  Administered 2022-10-31 – 2022-11-03 (×4): 5 mg via ORAL
  Filled 2022-10-30 (×4): qty 1

## 2022-10-30 MED ORDER — VANCOMYCIN HCL 1250 MG/250ML IV SOLN
1250.0000 mg | Freq: Once | INTRAVENOUS | Status: AC
  Administered 2022-10-30: 1250 mg via INTRAVENOUS
  Filled 2022-10-30: qty 250

## 2022-10-30 MED ORDER — IPRATROPIUM-ALBUTEROL 0.5-2.5 (3) MG/3ML IN SOLN
3.0000 mL | Freq: Four times a day (QID) | RESPIRATORY_TRACT | Status: DC
  Administered 2022-10-30: 3 mL via RESPIRATORY_TRACT
  Filled 2022-10-30: qty 3

## 2022-10-30 NOTE — Consult Note (Signed)
Pharmacy Antibiotic Note  SHANNE HAMS is a 82 y.o. male admitted on 10/30/2022 with pneumonia.  Pharmacy has been consulted for Zosyn dosing.  Plan: Zosyn 3.375g IV q8h (4 hour infusion).  Height: 5\' 10"  (177.8 cm) Weight: 59.1 kg (130 lb 4.7 oz) IBW/kg (Calculated) : 73  Temp (24hrs), Avg:97.9 F (36.6 C), Min:97.8 F (36.6 C), Max:98 F (36.7 C)  Recent Labs  Lab 10/30/22 1208 10/30/22 1516  WBC 18.8*  --   CREATININE 1.03  --   LATICACIDVEN 2.3* 1.6    Estimated Creatinine Clearance: 47.8 mL/min (by C-G formula based on SCr of 1.03 mg/dL).    Allergies  Allergen Reactions   Ace Inhibitors Cough    Antimicrobials this admission: Cefepime/vancomycin x 1 8/23 zosyn 8/23 >>  Dose adjustments this admission: N/A  Microbiology results: 8/23 BCx: pending 8/23 Sputum: ordered  8/23 MRSA PCR: ordered 8/23 AFB smear: ordered  Thank you for allowing pharmacy to be a part of this patient's care.  Barrie Folk, PharmD 10/30/2022 4:30 PM

## 2022-10-30 NOTE — ED Triage Notes (Signed)
Pt sent here by his doctor for abnormal labs. Pt states she has been feeling weak as well.

## 2022-10-30 NOTE — ED Notes (Signed)
Patient transported to CT 

## 2022-10-30 NOTE — ED Provider Notes (Signed)
[facility] Provider Note    Event Date/Time   First MD Initiated Contact with Patient 10/30/22 1144     (approximate)   History   Abnormal Labs   HPI Nicholas Alvarez is a 82 y.o. male presenting today for abnormal labs.  He saw his primary care provider couple days ago and was found to have elevated white blood cell count and was told to come to the emergency department for further evaluation.  Family reports shortness of breath, 60 pound weight loss over the past several months.  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  Patient notes not feeling hungry at all anymore.  Very little mobility.  Denies chest pain, fevers, abdominal pain, vomiting.     Physical Exam   Triage Vital Signs: ED Triage Vitals [10/30/22 1141]  Encounter Vitals Group     BP (!) 83/57     Systolic BP Percentile      Diastolic BP Percentile      Pulse      Resp 16     Temp 97.8 F (36.6 C)     Temp Source Oral     SpO2      Weight 130 lb 4.7 oz (59.1 kg)     Height 5\' 10"  (1.778 m)     Head Circumference      Peak Flow      Pain Score 0     Pain Loc      Pain Education      Exclude from Growth Chart     Most recent vital signs: Vitals:   10/30/22 1530 10/30/22 1543  BP: 101/63   Pulse: 92   Resp: 20   Temp:  98 F (36.7 C)  SpO2: 94%    Physical Exam: I have reviewed the vital signs and nursing notes. General: Awake, alert, no acute distress.  Chronically frail appearing.  Cachectic. Head:  Atraumatic, normocephalic.   ENT:  EOM intact, PERRL. Oral mucosa is pink and moist with no lesions. Neck: Neck is supple with full range of motion, No meningeal signs. Cardiovascular:  RRR, No murmurs. Peripheral pulses palpable and equal bilaterally. Respiratory:  Symmetrical chest wall expansion.  No rhonchi,  rales, or wheezes.  Good air movement throughout.  No use of accessory muscles.   Musculoskeletal:  No cyanosis or edema. Moving extremities with full ROM.  Significant muscle wasting throughout Abdomen:  Soft, nontender, nondistended. Neuro:  GCS 15, moving all four extremities, interacting appropriately. Speech clear. Psych:  Calm, appropriate.   Skin:  Warm, dry, no rash.     ED Results / Procedures / Treatments   Labs (all labs ordered are listed, but only abnormal results are displayed) Labs Reviewed  CBC WITH DIFFERENTIAL/PLATELET - Abnormal; Notable for the following components:      Result Value   WBC 18.8 (*)    RBC 3.50 (*)    Hemoglobin 9.1 (*)    HCT 30.2 (*)    Platelets 518 (*)    Neutro Abs 16.7 (*)    Monocytes Absolute 1.1 (*)    Abs Immature Granulocytes 0.14 (*)    All other components within normal limits  COMPREHENSIVE METABOLIC PANEL - Abnormal; Notable for the following components:   Glucose, Bld 143 (*)    BUN 28 (*)  Albumin 2.7 (*)    All other components within normal limits  LACTIC ACID, PLASMA - Abnormal; Notable for the following components:   Lactic Acid, Venous 2.3 (*)    All other components within normal limits  RESP PANEL BY RT-PCR (RSV, FLU A&B, COVID)  RVPGX2  CULTURE, BLOOD (ROUTINE X 2)  CULTURE, BLOOD (ROUTINE X 2)  EXPECTORATED SPUTUM ASSESSMENT W GRAM STAIN, RFLX TO RESP C  ACID FAST SMEAR (AFB, MYCOBACTERIA)  ACID FAST CULTURE WITH REFLEXED SENSITIVITIES (MYCOBACTERIA)  MAGNESIUM  LACTIC ACID, PLASMA  PROTIME-INR  APTT  URINALYSIS, W/ REFLEX TO CULTURE (INFECTION SUSPECTED)  HEPARIN LEVEL (UNFRACTIONATED)  TSH  CORTISOL     EKG    RADIOLOGY CT abdomen pelvis independently interpreted by me and radiology showing concern for infectious process throughout bilateral lungs as well as evidence of PEs without right heart strain.  CT abdomen/pelvis otherwise unremarkable for acute findings.   PROCEDURES:  Critical Care  performed: Yes, see critical care procedure note(s)  .Critical Care  Performed by: Janith Lima, MD Authorized by: Janith Lima, MD   Critical care provider statement:    Critical care time (minutes):  35   Critical care was necessary to treat or prevent imminent or life-threatening deterioration of the following conditions:  Sepsis   Critical care was time spent personally by me on the following activities:  Ordering and performing treatments and interventions, ordering and review of laboratory studies, ordering and review of radiographic studies, pulse oximetry, re-evaluation of patient's condition, obtaining history from patient or surrogate, interpretation of cardiac output measurements, blood draw for specimens, development of treatment plan with patient or surrogate, discussions with consultants, evaluation of patient's response to treatment, examination of patient and review of old charts   Care discussed with: admitting provider      MEDICATIONS ORDERED IN ED: Medications  lactated ringers infusion (has no administration in time range)  heparin ADULT infusion 100 units/mL (25000 units/29mL) (950 Units/hr Intravenous New Bag/Given 10/30/22 1606)  HYDROcodone-acetaminophen (NORCO/VICODIN) 5-325 MG per tablet 1-2 tablet (has no administration in time range)  mirtazapine (REMERON) tablet 15 mg (has no administration in time range)  finasteride (PROSCAR) tablet 5 mg (has no administration in time range)  oxybutynin (DITROPAN) tablet 5 mg (has no administration in time range)  tamsulosin (FLOMAX) capsule 0.4 mg (has no administration in time range)  mirabegron ER (MYRBETRIQ) tablet 25 mg (has no administration in time range)  Melatonin TABS 10 mg (has no administration in time range)  mycophenolate (CELLCEPT) tablet 500 mg (has no administration in time range)  albuterol (VENTOLIN HFA) 108 (90 Base) MCG/ACT inhaler 1-2 puff (has no administration in time range)  feeding supplement  (ENSURE ENLIVE / ENSURE PLUS) liquid 237 mL (has no administration in time range)  dronabinol (MARINOL) capsule 2.5 mg (has no administration in time range)  ipratropium-albuterol (DUONEB) 0.5-2.5 (3) MG/3ML nebulizer solution 3 mL (has no administration in time range)  guaiFENesin (MUCINEX) 12 hr tablet 600 mg (has no administration in time range)  lactated ringers bolus 1,000 mL (0 mLs Intravenous Stopped 10/30/22 1420)  lactated ringers bolus 1,000 mL (0 mLs Intravenous Stopped 10/30/22 1355)  vancomycin (VANCOREADY) IVPB 1250 mg/250 mL (1,250 mg Intravenous New Bag/Given 10/30/22 1423)  ceFEPIme (MAXIPIME) 2 g in sodium chloride 0.9 % 100 mL IVPB (0 g Intravenous Stopped 10/30/22 1355)  iohexol (OMNIPAQUE) 350 MG/ML injection 75 mL (75 mLs Intravenous Contrast Given 10/30/22 1344)  heparin bolus via infusion 4,000 Units (4,000 Units  Intravenous Bolus from Bag 10/30/22 1606)     IMPRESSION / MDM / ASSESSMENT AND PLAN / ED COURSE  I reviewed the triage vital signs and the nursing notes.                              Differential diagnosis includes, but is not limited to, sepsis secondary to pulmonary infection, malignancy, dehydration, pulmonary embolism.  Patient's presentation is most consistent with acute presentation with potential threat to life or bodily function.  Patient is an 82 year old male presenting today with tachycardia, hypotension, and leukocytosis from outpatient labs.  Patient presents septic according to vital signs.  No hypoxia or shortness of breath.  CT abdomen/pelvis and CT chest most concerning for pulmonary infection and history of prior Mycobacterium avium.  New cavitary lesion as well possibly concerning for progression of infection versus malignancy.  Separately, patient has bilateral subsegmental PEs with no evidence of right heart strain.  Broad-spectrum antibiotics were initiated, patient started on heparin drip.  Patient mid to hospitalist for further care.  Initial  lactic at 2.3.  Reassessed patient after 2 hours with improving vital signs and lactic downtrending to 1.6.  Patient admitted to hospitalist for further care.  The patient is on the cardiac monitor to evaluate for evidence of arrhythmia and/or significant heart rate changes. Clinical Course as of 10/30/22 1609  Fri Oct 30, 2022  1249 CBC with Differential(!) New leukocytosis and thrombocytosis with mild anemia [DW]  1249 DG Chest Port 1 View Hyperinflation with known chronic lung changes with interstitial changes and bronchiectasis. There is slightly more opacity in the left midlung. [DW]  1321 Lactic Acid, Venous(!!): 2.3 [DW]  1412 Reassessed patient.  Heart rate down to 91 at this time. [DW]  1433 BP: 103/68 [DW]  1504 Scattered subsegmental PE present. No right heart strain [DW]  1529 Patient admitted to hospitalist [DW]    Clinical Course User Index [DW] Janith Lima, MD     FINAL CLINICAL IMPRESSION(S) / ED DIAGNOSES   Final diagnoses:  Septic shock (HCC)  Multiple subsegmental pulmonary emboli without acute cor pulmonale (HCC)  Pneumonia of both lungs due to infectious organism, unspecified part of lung     Rx / DC Orders   ED Discharge Orders     None        Note:  This document was prepared using Dragon voice recognition software and may include unintentional dictation errors.   Janith Lima, MD 10/30/22 [phone]

## 2022-10-30 NOTE — Consult Note (Signed)
ANTICOAGULATION CONSULT NOTE   Pharmacy Consult for heparin infusion Indication: pulmonary embolus  Allergies  Allergen Reactions   Ace Inhibitors Cough    Patient Measurements: Height: 5\' 10"  (177.8 cm) Weight: 59.1 kg (130 lb 4.7 oz) IBW/kg (Calculated) : 73 Heparin Dosing Weight: 59.1 kg  Vital Signs: Temp: 97.8 F (36.6 C) (08/23 1141) Temp Source: Oral (08/23 1141) BP: 103/68 (08/23 1330) Pulse Rate: 88 (08/23 1330)  Labs: Recent Labs    10/30/22 1208  HGB 9.1*  HCT 30.2*  PLT 518*  APTT 35  LABPROT 14.9  INR 1.2  CREATININE 1.03    Estimated Creatinine Clearance: 47.8 mL/min (by C-G formula based on SCr of 1.03 mg/dL).   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    Chronic anemia    COVID-19    Depression    Dyslipidemia    Dyspnea    ED (erectile dysfunction)    Elevated PSA    Falls    Gross hematuria    History of methicillin resistant staphylococcus aureus (MRSA) 02/2022   + PCR nasal swab   Hx of hypotension    was placed on midodrine and then taken off   Hypertension    Interstitial lung disease (HCC)    Ischemic cardiomyopathy    with left ventricular ejection fraction of 35%   Left ventricular aneurysm    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   Myocardial infarction (HCC) 2009   Pneumonia    Protein-calorie malnutrition, severe (HCC)    S/P CABG x 5 2010   Sepsis due to pneumonia (HCC)    Sjogren's syndrome (HCC)    lung involvment    Medications:  No home anticoagulation per pharmacist review  Assessment: 82 yo male presented to ED due to abnormal labs (elevated WBC).  Patient found to have PE on CT.  Pharmacy consulted to initiate heparin infusion.  Goal of Therapy:   Heparin level 0.3-0.7 units/ml Monitor platelets by anticoagulation protocol: Yes   Plan:  Give 4000 units bolus x 1 Start heparin infusion at 950 units/hr Check anti-Xa level in 8 hours and daily while on heparin Continue to monitor H&H and platelets  Barrie Folk, PharmD 10/30/2022,3:15 PM

## 2022-10-30 NOTE — Consult Note (Signed)
CODE SEPSIS - PHARMACY COMMUNICATION  **Broad Spectrum Antibiotics should be administered within 1 hour of Sepsis diagnosis**  Time Code Sepsis Called/Page Received: 1159  Antibiotics Ordered: cefepime and vancomycin  Time of 1st antibiotic administration: 1325  Additional action taken by pharmacy: Messaged Dr. Anner Crete due to no antibiotics being ordered>>provider requested vancomycin and cefepime x 1.  If necessary, Name of Provider/Nurse Contacted: Dr. Don Broach ,PharmD Clinical Pharmacist  10/30/2022  12:04 PM

## 2022-10-30 NOTE — Progress Notes (Signed)
Palliative consult received.  Attempted to meet with patient/family unable due to patient undergoing [place] at bedside. Wife and other family member at bedside. Meeting scheduled for 8/24 AM.  No Charge.  Leeanne Deed, DNP, AGNP-C Palliative Medicine  Please call Palliative Medicine team phone with any questions [phone]. For individual providers please see AMION.

## 2022-10-30 NOTE — Sepsis Progress Note (Signed)
eLink is following this Code Sepsis. °

## 2022-10-30 NOTE — H&P (Signed)
History and Physical    Nicholas Alvarez:[id number] DOB: Feb 23, 1941 DOA: 10/30/2022  PCP: Kandyce Rud, MD (Confirm with patient/family/NH records and if not entered, this has to be entered at [facility] point of entry) Patient coming from: Home  I have personally briefly reviewed patient's old medical records in [facility]  Chief Complaint: Cough, SOB, feeling weak  HPI: Nicholas Alvarez is a 82 y.o. 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  Patient was diagnosed with MAI infection sometime last year and has been on 3 antibiotics regimen including azithromycin, myambutol, and rifampin 3 times a week.  Starting about 6 months ago, patient started to develop a cough, intermittently he will cough up some scanty phlegm and since then has had worsening of exertional dyspnea and lost of appetite and collectively he lost about 60 pounds.  Recently, family reported appeared patient has coughed up more thick whitish phlegm.  2 weeks ago patient underwent a urology procedure ofHoLEP procedure.  After the procedure, patient has stopped taking almost all of his medications " feeling tired to take 100 pills a day" including the 3 antibiotics.  And yesterday, patient went to see infectious disease doctor physical wrote at [facility], who did a blood work showed WBC> 20,000 and sent him to ED for further evaluation.  ED Course: Borderline tachycardia, borderline hypotensive nonhypoxic.  CTA showed scattered subsegmental pulmonary emboli in bilateral lower lobes New cavity mass of the lingular lobe with similar background findings consistent with chronic atypical infection, malignancy cannot be ruled out.  WBC 18.8, hemoglobin 9.1  Patient was given vancomycin and cefepime in the ED  Review of  Systems: As per HPI otherwise 14 point review of systems negative.    Past Medical History:  Diagnosis Date   Acute hypoxemic respiratory failure (HCC)    BPH (benign prostatic hyperplasia)    Bronchiectasis (HCC)    CAD (coronary artery disease)    Post anterior wall myocardial infarction and 5-vessel coronary bypass    Chronic anemia    COVID-19    Depression    Dyslipidemia    Dyspnea    ED (erectile dysfunction)    Elevated PSA    Falls    Gross hematuria    History of methicillin resistant staphylococcus aureus (MRSA) 02/2022   + PCR nasal swab   Hx of hypotension    was placed on midodrine and then taken off   Hypertension    Interstitial lung disease (HCC)    Ischemic cardiomyopathy    with left ventricular ejection fraction of 35%   Left ventricular aneurysm    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   Myocardial infarction (HCC) 2009   Pneumonia    Protein-calorie malnutrition, severe (HCC)    S/P CABG x 5 2010   Sepsis due to pneumonia (HCC)    Sjogren's syndrome (HCC)    lung involvment    Past Surgical History:  Procedure Laterality Date   BRONCHIAL WASHINGS N/A 01/16/2022   Procedure: BRONCHIAL WASHINGS;  Surgeon: Vida Rigger, MD;  Location: [facility] ORS;  Service: Thoracic;  Laterality: N/A;   CARDIAC CATHETERIZATION  03/2008   COLONOSCOPY     CORONARY ARTERY BYPASS GRAFT  03/12/2008   HOLEP-LASER ENUCLEATION OF THE PROSTATE WITH MORCELLATION N/A 10/12/2022   Procedure: HOLEP-LASER ENUCLEATION OF THE PROSTATE WITH MORCELLATION;  Surgeon: Vanna Scotland, MD;  Location: [facility] ORS;  Service: Urology;  Laterality: N/A;   PROSTATE BIOPSY  11/2009   RIGID BRONCHOSCOPY N/A 01/16/2022   Procedure: RIGID BRONCHOSCOPY;  Surgeon: Vida Rigger, MD;  Location: [facility] ORS;  Service: Thoracic;  Laterality: N/A;     reports that he has quit smoking. His smoking use included cigarettes. He has been exposed to tobacco smoke. He has never used  smokeless tobacco. He reports current alcohol use of about 12.0 standard drinks of alcohol per week. He reports that he does not use drugs.  Allergies  Allergen Reactions   Ace Inhibitors Cough    Family History  Problem Relation Age of Onset   Coronary artery disease Other    Heart attack Sister 39   Heart attack Maternal Grandfather 40   Heart attack Paternal Grandfather 6   COPD Mother    Pneumonia Father    Cancer Sister      Prior to Admission medications   Medication Sig Start Date End Date Taking? Authorizing Provider  albuterol (VENTOLIN HFA) 108 (90 Base) MCG/ACT inhaler Inhale 1-2 puffs into the lungs every 6 (six) hours as needed for wheezing or shortness of breath.   Yes [provider]  carvedilol (COREG) 12.5 MG tablet Take 12.5 mg by mouth 2 (two) times daily with a meal.   Yes [provider]  ethambutol (MYAMBUTOL) 400 MG tablet Take 1,800 mg by mouth 3 (three) times a week. M, W, F-takes 4.5 tablets   Yes [provider]  finasteride (PROSCAR) 5 MG tablet Take 5 mg by mouth every morning.   Yes [provider]  HYDROcodone-acetaminophen (NORCO/VICODIN) 5-325 MG tablet Take 1-2 tablets by mouth every 6 (six) hours as needed for moderate pain. 10/12/22  Yes Vanna Scotland, MD  mirtazapine (REMERON) 15 MG tablet Take 1 tablet by mouth at bedtime. 07/16/22 07/16/23 Yes [provider]  mycophenolate (CELLCEPT) 500 MG tablet Take 500 mg by mouth every morning.   Yes [provider]  oxybutynin (DITROPAN) 5 MG tablet Take 1 tablet (5 mg total) by mouth every 8 (eight) hours as needed for bladder spasms. 10/12/22  Yes Vanna Scotland, MD  rifampin (RIFADIN) 300 MG capsule Take 600 mg by mouth 3 (three) times a week. M, W, F   Yes [provider]  simvastatin (ZOCOR) 40 MG tablet Take 40 mg by mouth every morning.   Yes [provider]  tamsulosin (FLOMAX) 0.4 MG CAPS capsule Take 0.4 mg by mouth daily after  breakfast. 09/14/19  Yes [provider]  azithromycin (ZITHROMAX) 500 MG tablet Take 500 mg by mouth 3 (three) times a week. Patient not taking: Reported on 10/30/2022 09/16/22   [provider]  clopidogrel (PLAVIX) 75 MG tablet Take 75 mg by mouth daily. Patient not taking: Reported on 10/30/2022 08/20/19   [provider]  cyanocobalamin (VITAMIN B12) 1000 MCG tablet Take 1,000 mcg by mouth daily. Patient not taking: Reported on 10/30/2022    [provider]  ferrous sulfate 325 (65 FE) MG tablet Take 325 mg by mouth 3 (three) times a week. Patient not taking: Reported on 10/30/2022    [provider]  Melatonin 10 MG TABS Take 10 mg by mouth at bedtime. Patient not taking: Reported on 10/30/2022    [provider]  NON FORMULARY Take 1 tablet by mouth daily at 6 (six) AM. Brain Function Supplement Patient not taking: Reported on 10/30/2022    [provider]  Vibegron (GEMTESA) 75 MG TABS Take 1 tablet (75 mg total) by mouth daily. Patient not taking: Reported on 10/30/2022 09/03/22   Harle Battiest, PA-C    Physical Exam: Vitals:   10/30/22 1500 10/30/22 1530 10/30/22 1543 10/30/22 1600  BP: 112/69 101/63  108/67  Pulse: 95 92    Resp: (!) 23 20  (!) 21  Temp:   98 F (36.7 C)   TempSrc:      SpO2: 97% 94%    Weight:      Height:        Constitutional: NAD, calm, comfortable Vitals:   10/30/22 1500 10/30/22 1530 10/30/22 1543 10/30/22 1600  BP: 112/69 101/63  108/67  Pulse: 95 92    Resp: (!) 23 20  (!) 21  Temp:   98 F (36.7 C)   TempSrc:      SpO2: 97% 94%    Weight:      Height:       Eyes: PERRL, lids and conjunctivae normal.  Chronic ill appearance ENMT: Mucous membranes are moist. Posterior pharynx clear of any exudate or lesions.Normal dentition.  Neck: normal, supple, no masses, no thyromegaly Respiratory: clear to auscultation bilaterally, no wheezing, coarse crackles bilaterally, increasing  respiratory effort. No accessory muscle use.  Cardiovascular: Regular rate and rhythm, no murmurs / rubs / gallops. No extremity edema. 2+ pedal pulses. No carotid bruits.  Abdomen: no tenderness, no masses palpated. No hepatosplenomegaly. Bowel sounds positive.  Musculoskeletal: no clubbing / cyanosis. No joint deformity upper and lower extremities. Good ROM, no contractures. Normal muscle tone.  Skin: no rashes, lesions, ulcers. No induration Neurologic: CN 2-12 grossly intact. Sensation intact, DTR normal. Strength 5/5 in all 4.  Psychiatric: Normal judgment and insight. Alert and oriented x 3. Normal mood.     Labs on Admission: I have personally reviewed following labs and imaging studies  CBC: Recent Labs  Lab 10/30/22 1208  WBC 18.8*  NEUTROABS 16.7*  HGB 9.1*  HCT 30.2*  MCV 86.3  PLT 518*   Basic Metabolic Panel: Recent Labs  Lab 10/30/22 1208  NA 137  K 3.9  CL 104  CO2 23  GLUCOSE 143*  BUN 28*  CREATININE 1.03  CALCIUM 9.4  MG 2.2   GFR: Estimated Creatinine Clearance: 47.8 mL/min (by C-G formula based on SCr of 1.03 mg/dL). Liver Function Tests: Recent Labs  Lab 10/30/22 1208  AST 23  ALT 11  ALKPHOS 67  BILITOT 0.3  PROT 7.3  ALBUMIN 2.7*   No results for input(s): "LIPASE", "AMYLASE" in the last 168 hours. No results for input(s): "AMMONIA" in the last 168 hours. Coagulation Profile: Recent Labs  Lab 10/30/22 1208  INR 1.2   Cardiac Enzymes: No results for input(s): "CKTOTAL", "CKMB", "CKMBINDEX", "TROPONINI" in the last 168 hours. BNP (last 3 results) No results for input(s): "PROBNP" in the last 8760 hours. HbA1C: No results for input(s): "HGBA1C" in the last 72 hours. CBG: No results for input(s): "GLUCAP" in the last 168 hours. Lipid Profile: No results for input(s): "CHOL", "HDL", "LDLCALC", "TRIG", "CHOLHDL", "LDLDIRECT" in the last 72 hours. Thyroid Function Tests: No results for input(s): "TSH", "T4TOTAL", "FREET4", "T3FREE",  "THYROIDAB" in the last 72 hours. Anemia Panel: No results for input(s): "VITAMINB12", "FOLATE", "FERRITIN", "TIBC", "IRON", "RETICCTPCT" in the last 72 hours. Urine analysis:    Component Value Date/Time   COLORURINE YELLOW (A) 02/11/2022 0258   APPEARANCEUR Cloudy (A) 10/01/2022 1414   LABSPEC 1.028 02/11/2022 0258  LABSPEC 1.006 12/16/2012 0322   PHURINE 5.0 02/11/2022 0258   GLUCOSEU Negative 10/01/2022 1414   GLUCOSEU Negative 12/16/2012 0322   HGBUR NEGATIVE 02/11/2022 0258   BILIRUBINUR Negative 10/01/2022 1414   BILIRUBINUR Negative 12/16/2012 0322   KETONESUR 20 (A) 02/11/2022 0258   PROTEINUR 3+ (A) 10/01/2022 1414   PROTEINUR 30 (A) 02/11/2022 0258   UROBILINOGEN 0.2 03/15/2008 1651   NITRITE Positive (A) 10/01/2022 1414   NITRITE NEGATIVE 02/11/2022 0258   LEUKOCYTESUR 2+ (A) 10/01/2022 1414   LEUKOCYTESUR NEGATIVE 02/11/2022 0258   LEUKOCYTESUR Trace 12/16/2012 0322    Radiological Exams on Admission: CT Angio Chest PE W and/or Wo Contrast  Result Date: 10/30/2022 CLINICAL DATA:  Cachectic with history of 60 pound weight loss EXAM: CT ANGIOGRAPHY CHEST CT ABDOMEN AND PELVIS WITH CONTRAST TECHNIQUE: Multidetector CT imaging of the chest was performed using the standard protocol during bolus administration of intravenous contrast. Multiplanar CT image reconstructions and MIPs were obtained to evaluate the vascular anatomy. Multidetector CT imaging of the abdomen and pelvis was performed using the standard protocol during bolus administration of intravenous contrast. RADIATION DOSE REDUCTION: This exam was performed according to the departmental dose-optimization program which includes automated exposure control, adjustment of the mA and/or kV according to patient size and/or use of iterative reconstruction technique. CONTRAST:  75mL OMNIPAQUE IOHEXOL 350 MG/ML SOLN COMPARISON:  CT chest, abdomen and pelvis dated Aug 05, 2022 FINDINGS: CTA CHEST FINDINGS: Cardiovascular:  Scattered subsegmental pulmonary emboli are seen in the bilateral lower lobes. Normal heart size. No CT evidence of right heart strain (0.7 ratio). No pericardial effusion. Normal caliber thoracic aorta with severe calcified plaque. Severe coronary artery calcifications status post CABG. Dilated main pulmonary artery, measuring up to 3.4 cm. Mediastinum/Nodes: Esophagus and thyroid are unremarkable. Enlarged mediastinal and right hilar nodes, increased in size. Reference right hilar lymph node measures 11 mm short axis, previously 11 mm. Precarinal lymph node measures 14 mm, previously 10 mm. Lungs/Pleura: Central airways are patent. Diffuse bronchial wall thickening with scattered areas of mucous plugging. Numerous pulmonary nodules some with cavitation, are seen throughout the lungs, but most severe in the upper lobes. New cavitary mass of the lingula measuring 6.0 x 3.6 cm on series 5, image 89, findings are otherwise similar. No pleural effusion. Musculoskeletal: Prior median sternotomy. No acute or significant osseous findings. Review of the MIP images confirms the above findings. CT ABDOMEN and PELVIS FINDINGS Hepatobiliary: Unchanged low-attenuation lesion of the central liver measuring 13 mm on series 2, image 18. No gallstones, gallbladder wall thickening, or biliary dilatation. Pancreas: Unremarkable. No pancreatic ductal dilatation or surrounding inflammatory changes. Spleen: Normal in size without focal abnormality. Adrenals/Urinary Tract: Bilateral adrenal glands are unremarkable. No hydronephrosis. Punctate nonobstructing stone of the left kidney. Thick-walled urinary bladder. Stomach/Bowel: Stomach is within normal limits. Appendix appears normal. No evidence of bowel wall thickening, distention, or inflammatory changes. Vascular/Lymphatic: Abdominal aortic aneurysm measuring 4.3 by 3.8 cm, unchanged when compared with the prior exam. 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 No  enlarged lymph nodes seen in the chest. Reproductive: Heterogeneous and enlarged prostate, similar to prior exam Other: No abdominal wall hernia or abnormality. No abdominopelvic ascites. Musculoskeletal: No acute or significant osseous findings. Review of the MIP images confirms the above findings. IMPRESSION: Chest CTA: 1. Scattered subsegmental pulmonary emboli in the bilateral lower lobes. 2. 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 Finding may be  due to acute worsening of underlying infection, although neoplasm can not be excluded. Short-term follow-up chest CT in 1-2 months is recommended to ensure resolution. 3. Enlarged mediastinal and right hilar nodes, increased in size, possibly reactive. Recommend attention on follow-up. Abdomen and pelvis CT: 1. No acute findings in the abdomen or pelvis. 2. Unchanged indeterminate low-attenuation lesion of the central liver measuring 13 mm. Could be completely characterized with abdominal MRI, this can be non emergently. 3. Stable abdominal aortic aneurysm measuring to 4.3 cm. Recommend follow-up every 12 months and vascular consultation. This recommendation follows ACR consensus guidelines: White Paper of the ACR Incidental Findings Committee II on Vascular Findings. J Am Coll Radiol 2013; 10:789-794. 4. 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 Thick-walled urinary bladder, likely due to chronic outlet obstruction given associated prostatomegaly. 5. 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 Critical Value/emergent results were called by telephone at the time of interpretation on 10/30/2022 at 3:08 pm to provider DAVID WELLS , who verbally acknowledged these results. Electronically Signed   By: Allegra Lai M.D.   On: 10/30/2022 15:16   CT ABDOMEN PELVIS W CONTRAST  Result Date: 10/30/2022 CLINICAL DATA:  Cachectic with history of 60 pound weight loss  EXAM: CT ANGIOGRAPHY CHEST CT ABDOMEN AND PELVIS WITH CONTRAST TECHNIQUE: Multidetector CT imaging of the chest was performed using the standard protocol during bolus administration of intravenous contrast. Multiplanar CT image reconstructions and MIPs were obtained to evaluate the vascular anatomy. Multidetector CT imaging of the abdomen and pelvis was performed using the standard protocol during bolus administration of intravenous contrast. RADIATION DOSE REDUCTION: This exam was performed according to the departmental dose-optimization program which includes automated exposure control, adjustment of the mA and/or kV according to patient size and/or use of iterative reconstruction technique. CONTRAST:  75mL OMNIPAQUE IOHEXOL 350 MG/ML SOLN COMPARISON:  CT chest, abdomen and pelvis dated Aug 05, 2022 FINDINGS: CTA CHEST FINDINGS: Cardiovascular: Scattered subsegmental pulmonary emboli are seen in the bilateral lower lobes. Normal heart size. No CT evidence of right heart strain (0.7 ratio). No pericardial effusion. Normal caliber thoracic aorta with severe calcified plaque. Severe coronary artery calcifications status post CABG. Dilated main pulmonary artery, measuring up to 3.4 cm. Mediastinum/Nodes: Esophagus and thyroid are unremarkable. Enlarged mediastinal and right hilar nodes, increased in size. Reference right hilar lymph node measures 11 mm short axis, previously 11 mm. Precarinal lymph node measures 14 mm, previously 10 mm. Lungs/Pleura: Central airways are patent. Diffuse bronchial wall thickening with scattered areas of mucous plugging. Numerous pulmonary nodules some with cavitation, are seen throughout the lungs, but most severe in the upper lobes. New cavitary mass of the lingula measuring 6.0 x 3.6 cm on series 5, image 89, findings are otherwise similar. No pleural effusion. Musculoskeletal: Prior median sternotomy. No acute or significant osseous findings. Review of the MIP images confirms the  above findings. CT ABDOMEN and PELVIS FINDINGS Hepatobiliary: Unchanged low-attenuation lesion of the central liver measuring 13 mm on series 2, image 18. No gallstones, gallbladder wall thickening, or biliary dilatation. Pancreas: Unremarkable. No pancreatic ductal dilatation or surrounding inflammatory changes. Spleen: Normal in size without focal abnormality. Adrenals/Urinary Tract: Bilateral adrenal glands are unremarkable. No hydronephrosis. Punctate nonobstructing stone of the left kidney. Thick-walled urinary bladder. Stomach/Bowel: Stomach is within normal limits. Appendix appears normal. No evidence of bowel wall thickening, distention, or inflammatory changes. Vascular/Lymphatic: Abdominal aortic aneurysm measuring 4.3 by 3.8 cm, unchanged when compared with the prior exam. Unchanged left common iliac artery aneurysm measuring  2.3 cm. No enlarged lymph nodes seen in the chest. Reproductive: Heterogeneous and enlarged prostate, similar to prior exam Other: No abdominal wall hernia or abnormality. No abdominopelvic ascites. Musculoskeletal: No acute or significant osseous findings. Review of the MIP images confirms the above findings. IMPRESSION: Chest CTA: 1. Scattered subsegmental pulmonary emboli in the bilateral lower lobes. 2. 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 Finding may be due to acute worsening of underlying infection, although neoplasm can not be excluded. Short-term follow-up chest CT in 1-2 months is recommended to ensure resolution. 3. Enlarged mediastinal and right hilar nodes, increased in size, possibly reactive. Recommend attention on follow-up. Abdomen and pelvis CT: 1. No acute findings in the abdomen or pelvis. 2. Unchanged indeterminate low-attenuation lesion of the central liver measuring 13 mm. Could be completely characterized with abdominal MRI, this can be non emergently. 3. Stable abdominal  aortic aneurysm measuring to 4.3 cm. Recommend follow-up every 12 months and vascular consultation. This recommendation follows ACR consensus guidelines: White Paper of the ACR Incidental Findings Committee II on Vascular Findings. J Am Coll Radiol 2013; 10:789-794. 4. 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 Thick-walled urinary bladder, likely due to chronic outlet obstruction given associated prostatomegaly. 5. 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 Critical Value/emergent results were called by telephone at the time of interpretation on 10/30/2022 at 3:08 pm to provider DAVID WELLS , who verbally acknowledged these results. Electronically Signed   By: Allegra Lai M.D.   On: 10/30/2022 15:16   DG Chest Port 1 View  Result Date: 10/30/2022 CLINICAL DATA:  Sepsis. EXAM: PORTABLE CHEST 1 VIEW COMPARISON:  X-ray 02/10/2022.  CT 08/05/2022 FINDINGS: Status post median sternotomy. Normal cardiopericardial silhouette. Overlapping cardiac leads. Hyperinflation. Once again there is diffuse interstitial changes of the lungs with some patchy areas as well as bronchiectasis. The extent and distribution is similar to the prior x-ray in the right lung. There is slightly increasing opacity left midlung. Superimposed infiltrates possible. IMPRESSION: Hyperinflation with known chronic lung changes with interstitial changes and bronchiectasis. There is slightly more opacity in the left midlung. Please correlate for developing infiltrate and recommend follow-up Electronically Signed   By: Karen Kays M.D.   On: 10/30/2022 12:44    EKG: Pending  Assessment/Plan Principal Problem:   PNA (pneumonia) Active Problems:   Sepsis (HCC)   Bronchiectasis (HCC)   MAI (mycobacterium avium-intracellulare) (HCC)  (please populate well all problems here in Problem List. (For example, if patient is on BP meds at home and you resume or decide to hold them, it is a problem that needs to be her. Same for  CAD, COPD, HLD and so on)  Sepsis -Evidenced by worsening of leukocytosis, hypotension which responded to IV fluid, source of infection is considered to be multifocal pneumonia -Case discussed with infection disease doctor Sampson Goon at [facility], who recommended hold off anti-MAI treatment for now and sputum culture, AFB culture and recommended Zosyn to cover resistant infections. -For MAI infection, ID said that to the patient has been followed since last year and the most recent sputum culture was negative for MAI -As per recommended by ID doctor, also discussed case with on-call pulmonary, for possible bronchoscopy/biopsy to rule out underlying malignancy -Supportive care including breathing treatment bronchodilator, guaifenesin, flutter valve -Check TSH and cortisol level  Multiple focal pneumonia -As above  Question of recurrent MAI pneumonia -As above.  Incidental bilateral subsegmental PE -Unprovoked -Check DVT study,  check echocardiogram -Continue heparin drip for now -No history of GI bleed however has chronic iron deficiency anemia on iron supplement.  Will check FOBT and outpatient follow-up with GI for colonoscopy  BPH -Status post recent HoLEP study, and pathology showed benign tissue. -Discussed with patient's urology Dr. Apolinar Junes, who is okay with anticoagulation and resume Plavix.  Decided to hold off Plavix for now in case patient will need bronchoscopy  CAD -Denied any chest pains, hold off Plavix for possible incoming bronchoscopy -Add aspirin for now  Severe protein calorie malnutrition -Likely secondary to consuming conditions of chronic infection, management as above -As per recommendation from ID physician, will consult palliative care -No history of seizure or intracranial disease, will start trial of Marinol -Start Ensure  Anxiety/depression -Continue SSRI  DVT prophylaxis: Heparin drip Code Status: Full code Family Communication: Wife at  bedside Disposition Plan: Patient is sick with worsening of acute on chronic lung infection, and sepsis requiring IV antibiotics inpatient pulmonary consultation, expect more than 2 midnight hospital stay Consults called: Pulmonary, palliative care, curbside consult with ID and urology Admission status: PCU   Emeline General MD [facility] Pager [phone]  10/30/2022, 4:30 PM

## 2022-10-31 DIAGNOSIS — Z515 Encounter for palliative care: Secondary | ICD-10-CM | POA: Diagnosis not present

## 2022-10-31 DIAGNOSIS — J470 Bronchiectasis with acute lower respiratory infection: Secondary | ICD-10-CM | POA: Diagnosis not present

## 2022-10-31 DIAGNOSIS — A310 Pulmonary mycobacterial infection: Secondary | ICD-10-CM | POA: Diagnosis not present

## 2022-10-31 DIAGNOSIS — A419 Sepsis, unspecified organism: Secondary | ICD-10-CM

## 2022-10-31 DIAGNOSIS — I2694 Multiple subsegmental pulmonary emboli without acute cor pulmonale: Secondary | ICD-10-CM

## 2022-10-31 DIAGNOSIS — J189 Pneumonia, unspecified organism: Secondary | ICD-10-CM | POA: Diagnosis not present

## 2022-10-31 LAB — CBC
HCT: 23.1 % — ABNORMAL LOW (ref 39.0–52.0)
Hemoglobin: 7.2 g/dL — ABNORMAL LOW (ref 13.0–17.0)
MCH: 26.1 pg (ref 26.0–34.0)
MCHC: 31.2 g/dL (ref 30.0–36.0)
MCV: 83.7 fL (ref 80.0–100.0)
Platelets: 449 10*3/uL — ABNORMAL HIGH (ref 150–400)
RBC: 2.76 MIL/uL — ABNORMAL LOW (ref 4.22–5.81)
RDW: 15.3 % (ref 11.5–15.5)
WBC: 13.3 10*3/uL — ABNORMAL HIGH (ref 4.0–10.5)
nRBC: 0 % (ref 0.0–0.2)

## 2022-10-31 LAB — BASIC METABOLIC PANEL
Anion gap: 7 (ref 5–15)
BUN: 22 mg/dL (ref 8–23)
CO2: 24 mmol/L (ref 22–32)
Calcium: 8.5 mg/dL — ABNORMAL LOW (ref 8.9–10.3)
Chloride: 108 mmol/L (ref 98–111)
Creatinine, Ser: 0.82 mg/dL (ref 0.61–1.24)
GFR, Estimated: >60 mL/min (ref >60)
Glucose, Bld: 99 mg/dL (ref 70–99)
Potassium: 3.5 mmol/L (ref 3.5–5.1)
Sodium: 139 mmol/L (ref 135–145)

## 2022-10-31 LAB — HEPARIN LEVEL (UNFRACTIONATED)
Heparin Unfractionated: <0.1 [IU]/mL — ABNORMAL LOW (ref 0.30–0.70)
Heparin Unfractionated: <0.1 [IU]/mL — ABNORMAL LOW (ref 0.30–0.70)
Heparin Unfractionated: <0.1 [IU]/mL — ABNORMAL LOW (ref 0.30–0.70)

## 2022-10-31 LAB — HEMOGLOBIN AND HEMATOCRIT, BLOOD
HCT: 21.2 % — ABNORMAL LOW (ref 39.0–52.0)
HCT: 24.2 % — ABNORMAL LOW (ref 39.0–52.0)
Hemoglobin: 6.6 g/dL — ABNORMAL LOW (ref 13.0–17.0)
Hemoglobin: 7.7 g/dL — ABNORMAL LOW (ref 13.0–17.0)

## 2022-10-31 LAB — CORTISOL: Cortisol, Plasma: 18.4 ug/dL

## 2022-10-31 LAB — PREPARE RBC (CROSSMATCH)

## 2022-10-31 MED ORDER — HEPARIN BOLUS VIA INFUSION
1800.0000 [IU] | Freq: Once | INTRAVENOUS | Status: AC
  Administered 2022-10-31: 1800 [IU] via INTRAVENOUS
  Filled 2022-10-31: qty 1800

## 2022-10-31 MED ORDER — ACETYLCYSTEINE 20 % IN SOLN
3.0000 mL | Freq: Three times a day (TID) | RESPIRATORY_TRACT | Status: DC
  Administered 2022-10-31 – 2022-11-02 (×5): 3 mL via RESPIRATORY_TRACT
  Filled 2022-10-31 (×10): qty 4

## 2022-10-31 MED ORDER — SODIUM CHLORIDE 0.9 % IV SOLN
2.0000 g | Freq: Three times a day (TID) | INTRAVENOUS | Status: DC
  Administered 2022-10-31 – 2022-11-03 (×8): 2 g via INTRAVENOUS
  Filled 2022-10-31 (×10): qty 12.5

## 2022-10-31 MED ORDER — HEPARIN BOLUS VIA INFUSION
1700.0000 [IU] | Freq: Once | INTRAVENOUS | Status: AC
  Administered 2022-10-31: 1700 [IU] via INTRAVENOUS
  Filled 2022-10-31: qty 1700

## 2022-10-31 MED ORDER — SODIUM CHLORIDE 0.9% IV SOLUTION: Freq: Once | INTRAVENOUS | Status: AC

## 2022-10-31 NOTE — Consult Note (Signed)
PULMONOLOGY         Date: 10/31/2022,   MRN# [id number] Nicholas Alvarez 09-18-1940     AdmissionWeight: 59.1 kg                 CurrentWeight: 59.1 kg  Referring provider: DrAlexander   CHIEF COMPLAINT:    Multifocal pneumonia with failure of outpatient therapy.    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  Starting about 6 months ago, patient started to develop a cough, intermittently he will cough up some scanty phlegm and since then has had worsening of exertional dyspnea and lost of appetite and collectively he lost about 60 pounds.  2 weeks ago patient underwent a urology procedure, subsequently stopped taking almost all of his medications including the 3 antibiotics.    He has had copious amounts of phlegm with forceful cough that is severe in intensity and lasting day and night.  He has been progressively more weak and has also lost weight due to diminished appetite. He had CT PE done which I reviewed with findings showing bilateral cavitary lung disease which is difficult to tell from infection vs cancer vs fibronadular cavitary inflammaty lesions from ILD. He is empirically being treated for infection.  PCCM consultation for additional evaluation management of this complex respiratory patient.   PAST MEDICAL HISTORY   Past Medical History:  Diagnosis Date   Acute hypoxemic respiratory failure (HCC)    BPH (benign prostatic hyperplasia)    Bronchiectasis (HCC)    CAD (coronary artery disease)    Post anterior wall myocardial infarction and 5-vessel coronary bypass    Chronic anemia    COVID-19    Depression    Dyslipidemia     Dyspnea    ED (erectile dysfunction)    Elevated PSA    Falls    Gross hematuria    History of methicillin resistant staphylococcus aureus (MRSA) 02/2022   + PCR nasal swab   Hx of hypotension    was placed on midodrine and then taken off   Hypertension    Interstitial lung disease (HCC)    Ischemic cardiomyopathy    with left ventricular ejection fraction of 35%   Left ventricular aneurysm    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   Myocardial infarction (HCC) 2009   Pneumonia    Protein-calorie malnutrition, severe (HCC)    S/P CABG x 5 2010   Sepsis due to pneumonia (HCC)    Sjogren's syndrome (HCC)    lung involvment     SURGICAL HISTORY   Past Surgical History:  Procedure Laterality Date   BRONCHIAL WASHINGS N/A 01/16/2022   Procedure: BRONCHIAL WASHINGS;  Surgeon: Vida Rigger, MD;  Location: [facility];  Service: Thoracic;  Laterality: N/A;   CARDIAC CATHETERIZATION  03/2008   COLONOSCOPY     CORONARY ARTERY BYPASS GRAFT  03/12/2008   HOLEP-LASER ENUCLEATION OF THE PROSTATE WITH MORCELLATION N/A 10/12/2022   Procedure: HOLEP-LASER ENUCLEATION OF THE PROSTATE WITH MORCELLATION;  Surgeon: Vanna Scotland, MD;  Location: [facility];  Service: Urology;  Laterality: N/A;   PROSTATE BIOPSY  11/2009   RIGID BRONCHOSCOPY N/A 01/16/2022   Procedure: RIGID BRONCHOSCOPY;  Surgeon: Karna Christmas,  Luis Abed, MD;  Location: [facility];  Service: Thoracic;  Laterality: N/A;     FAMILY HISTORY   Family History  Problem Relation Age of Onset   Coronary artery disease Other    Heart attack Sister 41   Heart attack Maternal Grandfather 40   Heart attack Paternal Grandfather 2   COPD Mother    Pneumonia Father    Cancer Sister      SOCIAL HISTORY   Social History   Tobacco Use   Smoking status: Former    Types: Cigarettes    Passive exposure: Past   Smokeless tobacco: Never   Tobacco comments:    Quit around age 68  Vaping Use   Vaping status:  Never Used  Substance Use Topics   Alcohol use: Yes    Alcohol/week: 12.0 standard drinks of alcohol    Types: 12 Shots of liquor per week    Comment: occ   Drug use: No     MEDICATIONS    Home Medication:     Current Medication:  Current Facility-Administered Medications:    albuterol (PROVENTIL) (2.5 MG/3ML) 0.083% nebulizer solution 2.5 mg, 2.5 mg, Inhalation, Q6H PRN, Emeline General, MD   aspirin EC tablet 81 mg, 81 mg, Oral, Daily, Mikey College T, MD, 81 mg at 10/31/22 0946   dronabinol (MARINOL) capsule 2.5 mg, 2.5 mg, Oral, BID AC, Mikey College T, MD, 2.5 mg at 10/31/22 1104   feeding supplement (ENSURE ENLIVE / ENSURE PLUS) liquid 237 mL, 237 mL, Oral, BID BM, Mikey College T, MD, 237 mL at 10/31/22 1202   finasteride (PROSCAR) tablet 5 mg, 5 mg, Oral, BH-q7a, Zhang, Ping T, MD, 5 mg at 10/31/22 0600   guaiFENesin (MUCINEX) 12 hr tablet 600 mg, 600 mg, Oral, BID, Mikey College T, MD, 600 mg at 10/31/22 0946   heparin ADULT infusion 100 units/mL (25000 units/231mL), 1,250 Units/hr, Intravenous, Continuous, Angelique Blonder, RPH, Last Rate: 12.5 mL/hr at 10/31/22 1150, 1,250 Units/hr at 10/31/22 1150   HYDROcodone-acetaminophen (NORCO/VICODIN) 5-325 MG per tablet 1-2 tablet, 1-2 tablet, Oral, Q6H PRN, Mikey College T, MD   ipratropium-albuterol (DUONEB) 0.5-2.5 (3) MG/3ML nebulizer solution 3 mL, 3 mL, Nebulization, TID, Mikey College T, MD, 3 mL at 10/31/22 0721   mirtazapine (REMERON) tablet 15 mg, 15 mg, Oral, QHS, Mikey College T, MD, 15 mg at 10/30/22 2202   mycophenolate (CELLCEPT) capsule 500 mg, 500 mg, Oral, Charolette Forward, Ping T, MD, 500 mg at 10/31/22 0946   oxybutynin (DITROPAN) tablet 5 mg, 5 mg, Oral, Q8H PRN, Emeline General, MD   piperacillin-tazobactam (ZOSYN) IVPB 3.375 g, 3.375 g, Intravenous, Q8H, Barrie Folk, RPH, Last Rate: 12.5 mL/hr at 10/31/22 0558, 3.375 g at 10/31/22 0558   tamsulosin (FLOMAX) capsule 0.4 mg, 0.4 mg, Oral, QPC breakfast, Mikey College T, MD,  0.4 mg at 10/31/22 6440    ALLERGIES   Ace inhibitors     REVIEW OF SYSTEMS    Review of Systems:  Gen:  Denies  fever, sweats, chills weigh loss  HEENT: Denies blurred vision, double vision, ear pain, eye pain, hearing loss, nose bleeds, sore throat Cardiac:  No dizziness, chest pain or heaviness, chest tightness,edema Resp:   reports dyspnea chronically  Gi: Denies swallowing difficulty, stomach pain, nausea or vomiting, diarrhea, constipation, bowel incontinence Gu:  Denies bladder incontinence, burning urine Ext:   Denies Joint pain, stiffness or swelling Skin: Denies  skin rash, easy bruising or bleeding or hives Endoc:  Denies polyuria,  polydipsia , polyphagia or weight change Psych:   Denies depression, insomnia or hallucinations   Other:  All other systems negative   VS: BP 101/60 (BP Location: Left Arm)   Pulse 92   Temp 97.7 F (36.5 C)   Resp 20   Ht 5\' 10"  (1.778 m)   Wt 59.1 kg   SpO2 90%   BMI 18.69 kg/m      PHYSICAL EXAM    GENERAL:NAD, no fevers, chills, no weakness no fatigue HEAD: Normocephalic, atraumatic.  EYES: Pupils equal, round, reactive to light. Extraocular muscles intact. No scleral icterus.  MOUTH: Moist mucosal membrane. Dentition intact. No abscess noted.  EAR, NOSE, THROAT: Clear without exudates. No external lesions.  NECK: Supple. No thyromegaly. No nodules. No JVD.  PULMONARY: decreased breath sounds with mild rhonchi worse at bases bilaterally.  CARDIOVASCULAR: S1 and S2. Regular rate and rhythm. No murmurs, rubs, or gallops. No edema. Pedal pulses 2+ bilaterally.  GASTROINTESTINAL: Soft, nontender, nondistended. No masses. Positive bowel sounds. No hepatosplenomegaly.  MUSCULOSKELETAL: No swelling, clubbing, or edema. Range of motion full in all extremities.  NEUROLOGIC: Cranial nerves II through XII are intact. No gross focal neurological deficits. Sensation intact. Reflexes intact.  SKIN: No ulceration, lesions, rashes,  or cyanosis. Skin warm and dry. Turgor intact.  PSYCHIATRIC: Mood, affect within normal limits. The patient is awake, alert and oriented x 3. Insight, judgment intact.       IMAGING    arrative  CLINICAL DATA:  Cachectic with history of 60 pound weight loss  EXAM: CT ANGIOGRAPHY CHEST  CT ABDOMEN AND PELVIS WITH CONTRAST  TECHNIQUE: Multidetector CT imaging of the chest was performed using the standard protocol during bolus administration of intravenous ...        Study Result  Narrative & Impression  CLINICAL DATA:  Cachectic with history of 60 pound weight loss   EXAM: CT ANGIOGRAPHY CHEST   CT ABDOMEN AND PELVIS WITH CONTRAST   TECHNIQUE: Multidetector CT imaging of the chest was performed using the standard protocol during bolus administration of intravenous contrast. Multiplanar CT image reconstructions and MIPs were obtained to evaluate the vascular anatomy. Multidetector CT imaging of the abdomen and pelvis was performed using the standard protocol during bolus administration of intravenous contrast.   RADIATION DOSE REDUCTION: This exam was performed according to the departmental dose-optimization program which includes automated exposure control, adjustment of the mA and/or kV according to patient size and/or use of iterative reconstruction technique.   CONTRAST:  75mL OMNIPAQUE IOHEXOL 350 MG/ML SOLN   COMPARISON:  CT chest, abdomen and pelvis dated Aug 05, 2022   FINDINGS: CTA CHEST FINDINGS:   Cardiovascular: Scattered subsegmental pulmonary emboli are seen in the bilateral lower lobes. Normal heart size. No CT evidence of right heart strain (0.7 ratio). No pericardial effusion. Normal caliber thoracic aorta with severe calcified plaque. Severe coronary artery calcifications status post CABG. Dilated main pulmonary artery, measuring up to 3.4 cm.   Mediastinum/Nodes: Esophagus and thyroid are unremarkable. Enlarged mediastinal and right hilar  nodes, increased in size. Reference right hilar lymph node measures 11 mm short axis, previously 11 mm. Precarinal lymph node measures 14 mm, previously 10 mm.   Lungs/Pleura: Central airways are patent. Diffuse bronchial wall thickening with scattered areas of mucous plugging. Numerous pulmonary nodules some with cavitation, are seen throughout the lungs, but most severe in the upper lobes. New cavitary mass of the lingula measuring 6.0 x 3.6 cm on series 5, image 89, findings are  otherwise similar. No pleural effusion.   Musculoskeletal: Prior median sternotomy. No acute or significant osseous findings.   Review of the MIP images confirms the above findings.   CT ABDOMEN and PELVIS FINDINGS   Hepatobiliary: Unchanged low-attenuation lesion of the central liver measuring 13 mm on series 2, image 18. No gallstones, gallbladder wall thickening, or biliary dilatation.   Pancreas: Unremarkable. No pancreatic ductal dilatation or surrounding inflammatory changes.   Spleen: Normal in size without focal abnormality.   Adrenals/Urinary Tract: Bilateral adrenal glands are unremarkable. No hydronephrosis. Punctate nonobstructing stone of the left kidney. Thick-walled urinary bladder.   Stomach/Bowel: Stomach is within normal limits. Appendix appears normal. No evidence of bowel wall thickening, distention, or inflammatory changes.   Vascular/Lymphatic: Abdominal aortic aneurysm measuring 4.3 by 3.8 cm, unchanged when compared with the prior exam. 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 No enlarged lymph nodes seen in the chest.   Reproductive: Heterogeneous and enlarged prostate, similar to prior exam   Other: No abdominal wall hernia or abnormality. No abdominopelvic ascites.   Musculoskeletal: No acute or significant osseous findings.   Review of the MIP images confirms the above findings.   IMPRESSION: Chest CTA:   1. Scattered subsegmental  pulmonary emboli in the bilateral lower lobes. 2. 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 Finding may be due to acute worsening of underlying infection, although neoplasm can not be excluded. Short-term follow-up chest CT in 1-2 months is recommended to ensure resolution. 3. Enlarged mediastinal and right hilar nodes, increased in size, possibly reactive. Recommend attention on follow-up.   Abdomen and pelvis CT:   1. No acute findings in the abdomen or pelvis. 2. Unchanged indeterminate low-attenuation lesion of the central liver measuring 13 mm. Could be completely characterized with abdominal MRI, this can be non emergently. 3. Stable abdominal aortic aneurysm measuring to 4.3 cm. Recommend follow-up every 12 months and vascular consultation. This recommendation follows ACR consensus guidelines: White Paper of the ACR Incidental Findings Committee II on Vascular Findings. J Am Coll Radiol 2013; 10:789-794. 4. 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 Thick-walled urinary bladder, likely due to chronic outlet obstruction given associated prostatomegaly. 5. 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   Critical Value/emergent results were called by telephone at the time of interpretation on 10/30/2022 at 3:08 pm to provider DAVID WELLS , who verbally acknowledged these results.     Electronically Signed   By: Allegra Lai M.D.   On: 10/30/2022 15:16     ASSESSMENT/PLAN   Acute on chronic hypoxemic respiratory failure   - background of ILD with systemic inflammatory disorder   -has had several episodes of bacterial pneumonia    -Status post bronchoscopy with bronchoalveolar lavage growing MRSA -Agree with current antimicrobials including Cefepime/zosyn -poor long term prognosis - palliative care  Non massive pulmonary embolism    - likely due to ILD/sjogrens     - patient on anticoagualtion -patient is covid negative -DVT negative  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  -Continue CellCept 500 mg  daily  Coronary artery disease -Continue home regimen -Coreg -Plavix  Chronic bronchiectasis  -Mucomyst 20% nebulized 4 mL twice daily  Severe protein calorie malnutrition     Complicates management and recovery    - bitemporal wasting noted, peripheral muscle weakness noted    - supplemental nurioushment    -dronabinol+Remeron    - would not recommend  Megace due to increase risk for DVT/PE   GI DVT prophylaxis -Continue with Lovenox      Thank you for allowing me to participate in the care of this patient.   Patient/Family are satisfied with care plan and all questions have been answered.    Provider disclosure: Patient with at least one acute or chronic illness or injury that poses a threat to life or bodily function and is being managed actively during this encounter.  All of the below services have been performed independently by signing provider:  review of prior documentation from internal and or external health records.  Review of previous and current lab results.  Interview and comprehensive assessment during patient visit today. Review of current and previous chest radiographs/CT scans. Discussion of management and test interpretation with health care team and patient/family.   This document was prepared using Dragon voice recognition software and may include unintentional dictation errors.     Vida Rigger, M.D.  Division of Pulmonary & Critical Care Medicine

## 2022-10-31 NOTE — Consult Note (Signed)
Consultation Note Date: 10/31/2022   Patient Name: Nicholas Alvarez  DOB: [birth year]  MRN: [id number]  Age / Sex: 82 y.o., male  PCP: Kandyce Rud, MD Referring Physician: Sunnie Nielsen, DO  Reason for Consultation: Establishing goals of care  HPI/Brief Hospital Course: 82 y.o. 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  Reportedly stopped taking all medications including suppressive triple antibiotic therapy after a recent urology procedure. Developed worsening productive cough and worsening leukocytosis found during ID follow-up.  ED course CTA incidental finding of bilateral PE-started on Heparin gtt IMPRESSION: Chest CTA: 1. Scattered subsegmental pulmonary emboli in the bilateral lower lobes. 2. 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 Finding may be due to acute worsening of underlying infection, although neoplasm can not be excluded. Short-term follow-up chest CT in 1-2 months is recommended to ensure resolution. 3. Enlarged mediastinal and right hilar nodes, increased in size, possibly reactive. Recommend attention on follow-up.  Admitted and being treated with antibiotics for suspected worsening infection-pending pulmonology referral for recommendations-possible bronchoscopy   Palliative medicine was consulted for assisting with goals of care conversations.  Subjective:  Extensive chart review has been completed prior to meeting patient including labs, vital signs, imaging, progress notes, orders, and available advanced directive documents from current and previous encounters.  Visited with Mr. Crofton at his bedside. Collaborative  visit with Dr. Lyn Hollingshead. Mr. Kiplinger awake, alert and able to engage of conversations. No family at bedside during initial conversation.  Introduced myself as a Publishing rights manager as a member of the palliative care team. Explained palliative medicine is specialized medical care for people living with serious illness. It focuses on providing relief from the symptoms and stress of a serious illness. The goal is to improve quality of life for both the patient and the family.   Dr. Lyn Hollingshead reviewed findings from imaging and current treatments. Possible worsening infection versus possible malignancy. Incidental finding of bilateral PE. Dr. Lyn Hollingshead discussed need for pulmonology referral to offer further recommendations for interventions/treatment options.  Goals of care were also discussed. Mr. Guilbault remains open and willing to invasive testing and treatments available. He is hopeful to get definitive answers and start needed treatments. Code status was also discussed and Mr. Vuncannon was clear he would not want CPR/intubation. Orders updated by primary team.  Mr. Sloman shared a brief life review. He has been married to his current wife for over 30 years, together they do not have any children but he has 3 sons from previous marriages and relationships. His faith is important to him and his family. 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  Later returned to bedside and met with wife-[name], we reviewed conversations had earlier with Dr. Lyn Hollingshead as noted above. Also shared Ms. Dupin's decision for DNR status. [name] expressed agreement and understanding. Answered and addressed all questions and concerns.  PMT will continue to follow and support patient as needed.  Objective: Primary Diagnoses: Present on Admission:  Sepsis (HCC)  PNA (pneumonia)   Physical Exam Constitutional:      General: He is not in acute distress.    Appearance: He is ill-appearing.      Comments: Temporal wasting  Pulmonary:  Effort: Pulmonary effort is normal. No respiratory distress.  Skin:    General: Skin is warm and dry.  Neurological:     Mental Status: He is alert and oriented to person, place, and time.     Vital Signs: BP 92/60   Pulse 85   Temp 98.8 F (37.1 C)   Resp 19   Ht 5\' 10"  (1.778 m)   Wt 59.1 kg   SpO2 96%   BMI 18.69 kg/m  Pain Scale: 0-10   Pain Score: 0-No pain  Assessment and Plan  SUMMARY OF RECOMMENDATIONS   DNR Continue current plan of care PMT to continue to follow for ongoing needs and support  Thank you for this consult and allowing Palliative Medicine to participate in the care of Nickolas D. Scorsone. Palliative medicine will continue to follow and assist as needed.   Time Total: 55 minutes  Time spent includes: Detailed review of medical records (labs, imaging, vital signs), medically appropriate exam (mental status, respiratory, cardiac, skin), discussed with treatment team, counseling and educating patient, family and staff, documenting clinical information, medication management and coordination of care.   Signed by: Leeanne Deed, DNP, AGNP-C Palliative Medicine    Please contact Palliative Medicine Team phone at [phone] for questions and concerns.  For individual provider: See Loretha Stapler

## 2022-10-31 NOTE — Consult Note (Signed)
ANTICOAGULATION CONSULT NOTE   Pharmacy Consult for heparin infusion Indication: pulmonary embolus  Allergies  Allergen Reactions   Ace Inhibitors Cough    Patient Measurements: Height: 5\' 10"  (177.8 cm) Weight: 59.1 kg (130 lb 4.7 oz) IBW/kg (Calculated) : 73 Heparin Dosing Weight: 59.1 kg  Vital Signs: Temp: 99 F (37.2 C) (08/24 2018) Temp Source: Oral (08/24 2018) BP: 92/64 (08/24 2018) Pulse Rate: 94 (08/24 2018)  Labs: Recent Labs    10/30/22 1208 10/30/22 2322 10/31/22 0456 10/31/22 0908 10/31/22 1242 10/31/22 2055  HGB 9.1*  --  7.2*  --  6.6* 7.7*  HCT 30.2*  --  23.1*  --  21.2* 24.2*  PLT 518*  --  449*  --   --   --   APTT 35  --   --   --   --   --   LABPROT 14.9  --   --   --   --   --   INR 1.2  --   --   --   --   --   HEPARINUNFRC  --  <0.10*  --  <0.10*  --  <0.10*  CREATININE 1.03  --  0.82  --   --   --     Estimated Creatinine Clearance: 60.1 mL/min (by C-G formula based on SCr of 0.82 mg/dL).   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    Chronic anemia    COVID-19    Depression    Dyslipidemia    Dyspnea    ED (erectile dysfunction)    Elevated PSA    Falls    Gross hematuria    History of methicillin resistant staphylococcus aureus (MRSA) 02/2022   + PCR nasal swab   Hx of hypotension    was placed on midodrine and then taken off   Hypertension    Interstitial lung disease (HCC)    Ischemic cardiomyopathy    with left ventricular ejection fraction of 35%   Left ventricular aneurysm    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  Patient found to have PE on CT.  Pharmacy consulted to initiate heparin infusion.  8/23  2322 HL <0.10, subtherapeutic  8/24  0908 HL <0.10,  subtherapeutic 8/24  2055 HL <0.10,  SUBtherapeutic   Goal of Therapy:  Heparin level 0.3-0.7 units/ml Monitor platelets by anticoagulation protocol: Yes   8/24:  HL @ 2055 = < 0.1, SUBtherapeutic  - Will order heparin 1700 units IV X 1 bolus and increase drip rate to 1450 units/hr Next HL in 8 hours Daily CBC while on Heparin drip  Josalyn Dettmann D Clinical Pharmacist 10/31/2022

## 2022-10-31 NOTE — Progress Notes (Addendum)
PROGRESS NOTE    Nicholas Alvarez   WGN:[id number] DOB: 05-26-1940  DOA: 10/30/2022 Date of Service: 10/31/22 PCP: Kandyce Rud, MD     Brief Narrative / Hospital Course:  Nicholas Alvarez is a 82 y.o. 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 He was sent from ID office for worsening of leukocytosis, cough and weight loss. 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  Starting about 6 months ago, patient started to develop a cough, intermittently he will cough up some scanty phlegm and since then has had worsening of exertional dyspnea and lost of appetite and collectively he lost about 60 pounds.  2 weeks ago patient underwent a urology procedure, subsequently stopped taking almost all of his medications "feeling tired to take 100 pills a day" including the 3 antibiotics.   Yesterday, patient went to see  ID. WBC> 20,000 and was sent him to ED for further evaluation.  08/23: in ED, CTA incidental finding of bilateral PE. CT chest showed worsening and new cavity lesions likely recurrent MAI infection versus underlying malignancy.  Pulmonary consulted, holding plavix in case needs bronch.  Palliative care consulted. Echo pending. DVT US pending. Neg COVID/Flu. Started on vanc + cefepime, heparin gtt.  08/24: Palliative following. Lactate, WBC, Plt improved. Hgb drop probably some hemodilutional but following HH closely and transfusing 1 unit PRBC. D/w patient re: advanced care planning - he'd like to pursue diagnosis/treatment but would NOT want resuscitative measures in the event of cardiac/respiratory arrest. DNR status updated in chart. DVT neg. Echo pending. Per pulmonary, CT more c/w infection more so than cancer.   Consultants:  Palliative  care Pulmonology   Procedures: none      ASSESSMENT & PLAN:   Principal Problem:   PNA (pneumonia) Active Problems:   Sepsis (HCC)   Bronchiectasis (HCC)   MAI (mycobacterium avium-intracellulare) (HCC)   Sepsis d/t multifocal pneumonia Multifocal pneumonia Question of recurrent MAI pneumonia Cefepime + Zosyn Sputum culture  Pulmonary following  Case discussed by admitting hospitalist with ID Dr. Sampson Goon at [facility], who recommended  hold off anti-MAI treatment for now (hold home ethambutol, azithromycin, rifampin) sputum culture AFB culture Tx Zosyn to cover resistant infections.  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  No history of GI bleed  FOBT w/ next BM Consider outpatient follow-up with GI for colonoscopy   BPH Recent HoLEP study, and pathology showed benign tissue. Admitting hospitalist d/w urology Dr. Apolinar Junes, who is okay with anticoagulation and resume Plavix (holding plavix for now in case needing bronchoscopy)  Finasteride,  tamsulosin scheduled Oxybutinin prn    CAD Denied any chest pains hold  off Plavix for possible incoming bronchoscopy aspirin for now Holding beta blocker d/t borderline BP Holding statin    Severe protein calorie malnutrition Likely secondary to consuming conditions of chronic infection, management as above As per recommendation from ID physician, will consult palliative care No history of seizure or intracranial disease, will start trial of Marinol  Regular diet  Start Ensure   Anxiety/depression Continue Remeron  Advanced care planning Detailed discussion w/ patient at bedside, palliative care team member Ladona Ridgel was present as well, appreciate her help. Decision made for continue current plan of care - he is open to invasive testing such as bronchoscopy to help diagnose and guide treatment, however pt would not want CPR/intubation.  DNR status updated in orders and communicated to RN, will d/w family as well later today if needed.  Pt has capacity to understand his situation and give informed consent/refusal   DVT prophylaxis: on heparin gtt  Pertinent IV fluids/nutrition: regular diet, d/c continuous IV fluids  Central lines / invasive devices: none  Code Status: DNR ACP documentation reviewed: none on file   Current Admission Status: inpatient  TOC needs / Dispo plan: TBD Barriers to discharge / significant pending items: clinical improvement +.- bronchoscopy anticipate will be here another 3-4 days at least.              Subjective / Brief ROS:  Patient reports breathing is ok today, his ribs hurt some from coughing, cough is bothersome but not intolerable  Denies SOB.  Pain controlled.  Denies new weakness.  Tolerating diet but pleasantly notes that his food at home is better .  Reports no concerns w/ urination/defecation.   Family Communication: will d/w family later today     Objective Findings:  Vitals:   10/31/22 0334 10/31/22 0723 10/31/22 0935 10/31/22 1300  BP: 112/67  101/60   Pulse: 81  92   Resp: 18  20    Temp: 98.1 F (36.7 C)  97.7 F (36.5 C)   TempSrc:      SpO2: 95% 92% 90% 94%  Weight:      Height:        Intake/Output Summary (Last 24 hours) at 10/31/2022 1329 Last data filed at 10/31/2022 0300 Gross per 24 hour  Intake 3860.81 ml  Output --  Net 3860.81 ml   Filed Weights   10/30/22 1141  Weight: 59.1 kg    Examination:  Physical Exam Constitutional:      General: He is not in acute distress.    Appearance: He is underweight. He is not toxic-appearing.  Cardiovascular:     Rate and Rhythm: Normal rate and regular rhythm.  Pulmonary:     Effort: Pulmonary effort is normal. No respiratory distress.     Comments: Coarse sounds bilaterally but no severe wheezing/rhonchi  Abdominal:     Palpations: Abdomen is soft.  Musculoskeletal:     Right lower leg: No edema.     Left lower leg: No edema.  Neurological:     General: No focal deficit present.     Mental Status: He is alert and oriented to person, place, and time. Mental status is at baseline.  Psychiatric:        Mood and Affect: Mood normal.        Behavior: Behavior normal.        Thought Content: Thought content normal.  Judgment: Judgment normal.          Scheduled Medications:   acetylcysteine  3 mL Nebulization TID   aspirin EC  81 mg Oral Daily   dronabinol  2.5 mg Oral BID AC   feeding supplement  237 mL Oral BID BM   finasteride  5 mg Oral BH-q7a   guaiFENesin  600 mg Oral BID   ipratropium-albuterol  3 mL Nebulization TID   mirtazapine  15 mg Oral QHS   mycophenolate  500 mg Oral BH-q7a   tamsulosin  0.4 mg Oral QPC breakfast    Continuous Infusions:  ceFEPime (MAXIPIME) IV     heparin 1,250 Units/hr (10/31/22 1150)    PRN Medications:  albuterol, HYDROcodone-acetaminophen, oxybutynin  Antimicrobials from admission:  Anti-infectives (From admission, onward)    Start     Dose/Rate Route Frequency Ordered Stop   10/31/22 1400  ceFEPIme (MAXIPIME) 2 g in sodium chloride  0.9 % 100 mL IVPB        2 g 200 mL/hr over 30 Minutes Intravenous Every 8 hours 10/31/22 1252     10/30/22 2200  piperacillin-tazobactam (ZOSYN) IVPB 3.375 g  Status:  Discontinued        3.375 g 12.5 mL/hr over 240 Minutes Intravenous Every 8 hours 10/30/22 1630 10/31/22 1234   10/30/22 1245  vancomycin (VANCOREADY) IVPB 1250 mg/250 mL        1,250 mg 166.7 mL/hr over 90 Minutes Intravenous  Once 10/30/22 1239 10/30/22 1554   10/30/22 1245  ceFEPIme (MAXIPIME) 2 g in sodium chloride 0.9 % 100 mL IVPB        2 g 200 mL/hr over 30 Minutes Intravenous  Once 10/30/22 1239 10/30/22 1355           Data Reviewed:  I have personally reviewed the following...  CBC: Recent Labs  Lab 10/30/22 1208 10/31/22 0456 10/31/22 1242  WBC 18.8* 13.3*  --   NEUTROABS 16.7*  --   --   HGB 9.1* 7.2* 6.6*  HCT 30.2* 23.1* 21.2*  MCV 86.3 83.7  --   PLT 518* 449*  --    Basic Metabolic Panel: Recent Labs  Lab 10/30/22 1208 10/31/22 0456  NA 137 139  K 3.9 3.5  CL 104 108  CO2 23 24  GLUCOSE 143* 99  BUN 28* 22  CREATININE 1.03 0.82  CALCIUM 9.4 8.5*  MG 2.2  --    GFR: Estimated Creatinine Clearance: 60.1 mL/min (by C-G formula based on SCr of 0.82 mg/dL). Liver Function Tests: Recent Labs  Lab 10/30/22 1208  AST 23  ALT 11  ALKPHOS 67  BILITOT 0.3  PROT 7.3  ALBUMIN 2.7*   No results for input(s): "LIPASE", "AMYLASE" in the last 168 hours. No results for input(s): "AMMONIA" in the last 168 hours. Coagulation Profile: Recent Labs  Lab 10/30/22 1208  INR 1.2   Cardiac Enzymes: No results for input(s): "CKTOTAL", "CKMB", "CKMBINDEX", "TROPONINI" in the last 168 hours. BNP (last 3 results) No results for input(s): "PROBNP" in the last 8760 hours. HbA1C: No results for input(s): "HGBA1C" in the last 72 hours. CBG: No results for input(s): "GLUCAP" in the last 168 hours. Lipid Profile: No results for input(s): "CHOL", "HDL", "LDLCALC", "TRIG", "CHOLHDL",  "LDLDIRECT" in the last 72 hours. Thyroid Function Tests: Recent Labs    10/30/22 1206  TSH 2.161   Anemia Panel: Recent Labs    10/30/22 1208 10/30/22 1630  TIBC  --  134*  IRON  --  15*  RETICCTPCT 1.2  --    Most Recent Urinalysis On File:     Component Value Date/Time   COLORURINE YELLOW (A) 02/11/2022 0258   APPEARANCEUR Cloudy (A) 10/01/2022 1414   LABSPEC 1.028 02/11/2022 0258   LABSPEC 1.006 12/16/2012 0322   PHURINE 5.0 02/11/2022 0258   GLUCOSEU Negative 10/01/2022 1414   GLUCOSEU Negative 12/16/2012 0322   HGBUR NEGATIVE 02/11/2022 0258   BILIRUBINUR Negative 10/01/2022 1414   BILIRUBINUR Negative 12/16/2012 0322   KETONESUR 20 (A) 02/11/2022 0258   PROTEINUR 3+ (A) 10/01/2022 1414   PROTEINUR 30 (A) 02/11/2022 0258   UROBILINOGEN 0.2 03/15/2008 1651   NITRITE Positive (A) 10/01/2022 1414   NITRITE NEGATIVE 02/11/2022 0258   LEUKOCYTESUR 2+ (A) 10/01/2022 1414   LEUKOCYTESUR NEGATIVE 02/11/2022 0258   LEUKOCYTESUR Trace 12/16/2012 0322   Sepsis Labs: @LABRCNTIP (procalcitonin:4,lacticidven:4) Microbiology: Recent Results (from the past 240 hour(s))  Resp panel by RT-PCR (RSV, Flu A&B, Covid) Anterior Nasal Swab     Status: None   Collection Time: 10/30/22 12:08 PM   Specimen: Anterior Nasal Swab  Result Value Ref Range Status   SARS Coronavirus 2 by RT PCR NEGATIVE NEGATIVE Final    Comment: (NOTE) SARS-CoV-2 target nucleic acids are NOT DETECTED.  The SARS-CoV-2 RNA is generally detectable in upper respiratory specimens during the acute phase of infection. The lowest concentration of SARS-CoV-2 viral copies this assay can detect is 138 copies/mL. A negative result does not preclude SARS-Cov-2 infection and should not be used as the sole basis for treatment or other patient management decisions. A negative result may occur with  improper specimen collection/handling, submission of specimen other than nasopharyngeal swab, presence of viral  mutation(s) within the areas targeted by this assay, and inadequate number of viral copies(<138 copies/mL). A negative result must be combined with clinical observations, patient history, and epidemiological information. The expected result is Negative.  Fact Sheet for Patients:  BloggerCourse.com  Fact Sheet for Healthcare Providers:  SeriousBroker.it  This test is no t yet approved or cleared by the [place] FDA and  has been authorized for detection and/or diagnosis of SARS-CoV-2 by FDA under an Emergency Use Authorization (EUA). This EUA will remain  in effect (meaning this test can be used) for the duration of the COVID-19 declaration under Section 564(b)(1) of the Act, 21 U.S.C.section 360bbb-3(b)(1), unless the authorization is terminated  or revoked sooner.       Influenza A by PCR NEGATIVE NEGATIVE Final   Influenza B by PCR NEGATIVE NEGATIVE Final    Comment: (NOTE) The Xpert Xpress SARS-CoV-2/FLU/RSV plus assay is intended as an aid in the diagnosis of influenza from Nasopharyngeal swab specimens and should not be used as a sole basis for treatment. Nasal washings and aspirates are unacceptable for Xpert Xpress SARS-CoV-2/FLU/RSV testing.  Fact Sheet for Patients: BloggerCourse.com  Fact Sheet for Healthcare Providers: SeriousBroker.it  This test is not yet approved or cleared by the [place] FDA and has been authorized for detection and/or diagnosis of SARS-CoV-2 by FDA under an Emergency Use Authorization (EUA). This EUA will remain in effect (meaning this test can be used) for the duration of the COVID-19 declaration under Section 564(b)(1) of the Act, 21 U.S.C. section 360bbb-3(b)(1), unless the authorization is terminated or revoked.     Resp Syncytial Virus by PCR NEGATIVE NEGATIVE Final    Comment: (NOTE) Fact Sheet for  Patients: BloggerCourse.com  Fact Sheet for Healthcare Providers: SeriousBroker.it  This test is not yet approved or  cleared by the [place] and has been authorized for detection and/or diagnosis of SARS-CoV-2 by FDA under an Emergency Use Authorization (EUA). This EUA will remain in effect (meaning this test can be used) for the duration of the COVID-19 declaration under Section 564(b)(1) of the Act, 21 U.S.C. section 360bbb-3(b)(1), unless the authorization is terminated or revoked.  Performed at [facility], [address] [place], [place] [address]   Blood Culture (routine x 2)     Status: None (Preliminary result)   Collection Time: 10/30/22 12:08 PM   Specimen: BLOOD  Result Value Ref Range Status   Specimen Description BLOOD BLOOD LEFT ARM  Final   Special Requests   Final    BOTTLES DRAWN AEROBIC AND ANAEROBIC Blood Culture adequate volume   Culture   Final    NO GROWTH < 24 HOURS Performed at [facility], [address], [place], [place] [address]    Report Status PENDING  Incomplete  Blood Culture (routine x 2)     Status: None (Preliminary result)   Collection Time: 10/30/22 12:08 PM   Specimen: BLOOD  Result Value Ref Range Status   Specimen Description BLOOD BLOOD RIGHT ARM  Final   Special Requests   Final    BOTTLES DRAWN AEROBIC AND ANAEROBIC Blood Culture adequate volume   Culture   Final    NO GROWTH < 24 HOURS Performed at [facility], [address] [place], [place] [address]    Report Status PENDING  Incomplete      Radiology Studies last 3 days: US Venous Img Lower Bilateral (DVT)  Result Date: 10/30/2022 CLINICAL DATA:  Pulmonary embolism. Evaluate for lower extremity DVT. EXAM: BILATERAL LOWER EXTREMITY VENOUS DOPPLER ULTRASOUND TECHNIQUE: Gray-scale sonography with graded compression, as well as color Doppler and duplex ultrasound were performed to  evaluate the lower extremity deep venous systems from the level of the common femoral vein and including the common femoral, femoral, profunda femoral, popliteal and calf veins including the posterior tibial, peroneal and gastrocnemius veins when visible. The superficial great saphenous vein was also interrogated. Spectral Doppler was utilized to evaluate flow at rest and with distal augmentation maneuvers in the common femoral, femoral and popliteal veins. COMPARISON:  None Available. FINDINGS: RIGHT LOWER EXTREMITY Common Femoral Vein: No evidence of thrombus. Normal compressibility, respiratory phasicity and response to augmentation. Saphenofemoral Junction: No evidence of thrombus. Normal compressibility and flow on color Doppler imaging. Profunda Femoral Vein: No evidence of thrombus. Normal compressibility and flow on color Doppler imaging. Femoral Vein: No evidence of thrombus. Normal compressibility, respiratory phasicity and response to augmentation. Popliteal Vein: No evidence of thrombus. Normal compressibility, respiratory phasicity and response to augmentation. Calf Veins: No evidence of thrombus. Normal compressibility and flow on color Doppler imaging. Superficial Great Saphenous Vein: No evidence of thrombus. Normal compressibility. Other Findings:  None. LEFT LOWER EXTREMITY Common Femoral Vein: No evidence of thrombus. Normal compressibility, respiratory phasicity and response to augmentation. Saphenofemoral Junction: No evidence of thrombus. Normal compressibility and flow on color Doppler imaging. Profunda Femoral Vein: No evidence of thrombus. Normal compressibility and flow on color Doppler imaging. Femoral Vein: No evidence of thrombus. Normal compressibility, respiratory phasicity and response to augmentation. Popliteal Vein: No evidence of thrombus. Normal compressibility, respiratory phasicity and response to augmentation. Calf Veins: No evidence of thrombus. Normal compressibility and flow  on color Doppler imaging. Superficial Great Saphenous Vein: No evidence of thrombus. Normal compressibility. Other Findings:  None. IMPRESSION: No evidence of  DVT within either lower extremity. Electronically Signed   By: Simonne Come M.D.   On: 10/30/2022 17:27   CT Angio Chest PE W and/or Wo Contrast  Result Date: 10/30/2022 CLINICAL DATA:  Cachectic with history of 60 pound weight loss EXAM: CT ANGIOGRAPHY CHEST CT ABDOMEN AND PELVIS WITH CONTRAST TECHNIQUE: Multidetector CT imaging of the chest was performed using the standard protocol during bolus administration of intravenous contrast. Multiplanar CT image reconstructions and MIPs were obtained to evaluate the vascular anatomy. Multidetector CT imaging of the abdomen and pelvis was performed using the standard protocol during bolus administration of intravenous contrast. RADIATION DOSE REDUCTION: This exam was performed according to the departmental dose-optimization program which includes automated exposure control, adjustment of the mA and/or kV according to patient size and/or use of iterative reconstruction technique. CONTRAST:  75mL OMNIPAQUE IOHEXOL 350 MG/ML SOLN COMPARISON:  CT chest, abdomen and pelvis dated Aug 05, 2022 FINDINGS: CTA CHEST FINDINGS: Cardiovascular: Scattered subsegmental pulmonary emboli are seen in the bilateral lower lobes. Normal heart size. No CT evidence of right heart strain (0.7 ratio). No pericardial effusion. Normal caliber thoracic aorta with severe calcified plaque. Severe coronary artery calcifications status post CABG. Dilated main pulmonary artery, measuring up to 3.4 cm. Mediastinum/Nodes: Esophagus and thyroid are unremarkable. Enlarged mediastinal and right hilar nodes, increased in size. Reference right hilar lymph node measures 11 mm short axis, previously 11 mm. Precarinal lymph node measures 14 mm, previously 10 mm. Lungs/Pleura: Central airways are patent. Diffuse bronchial wall thickening with scattered  areas of mucous plugging. Numerous pulmonary nodules some with cavitation, are seen throughout the lungs, but most severe in the upper lobes. New cavitary mass of the lingula measuring 6.0 x 3.6 cm on series 5, image 89, findings are otherwise similar. No pleural effusion. Musculoskeletal: Prior median sternotomy. No acute or significant osseous findings. Review of the MIP images confirms the above findings. CT ABDOMEN and PELVIS FINDINGS Hepatobiliary: Unchanged low-attenuation lesion of the central liver measuring 13 mm on series 2, image 18. No gallstones, gallbladder wall thickening, or biliary dilatation. Pancreas: Unremarkable. No pancreatic ductal dilatation or surrounding inflammatory changes. Spleen: Normal in size without focal abnormality. Adrenals/Urinary Tract: Bilateral adrenal glands are unremarkable. No hydronephrosis. Punctate nonobstructing stone of the left kidney. Thick-walled urinary bladder. Stomach/Bowel: Stomach is within normal limits. Appendix appears normal. No evidence of bowel wall thickening, distention, or inflammatory changes. Vascular/Lymphatic: Abdominal aortic aneurysm measuring 4.3 by 3.8 cm, unchanged when compared with the prior exam. 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 No enlarged lymph nodes seen in the chest. Reproductive: Heterogeneous and enlarged prostate, similar to prior exam Other: No abdominal wall hernia or abnormality. No abdominopelvic ascites. Musculoskeletal: No acute or significant osseous findings. Review of the MIP images confirms the above findings. IMPRESSION: Chest CTA: 1. Scattered subsegmental pulmonary emboli in the bilateral lower lobes. 2. 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 Finding may be due to acute worsening of underlying infection, although neoplasm can not be excluded. Short-term follow-up chest CT in 1-2 months is recommended to  ensure resolution. 3. Enlarged mediastinal and right hilar nodes, increased in size, possibly reactive. Recommend attention on follow-up. Abdomen and pelvis CT: 1. No acute findings in the abdomen or pelvis. 2. Unchanged indeterminate low-attenuation lesion of the central liver measuring 13 mm. Could be completely characterized with abdominal MRI, this can be non emergently. 3. Stable abdominal aortic aneurysm measuring to  4.3 cm. Recommend follow-up every 12 months and vascular consultation. This recommendation follows ACR consensus guidelines: White Paper of the ACR Incidental Findings Committee II on Vascular Findings. J Am Coll Radiol 2013; 10:789-794. 4. 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 Thick-walled urinary bladder, likely due to chronic outlet obstruction given associated prostatomegaly. 5. 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 Critical Value/emergent results were called by telephone at the time of interpretation on 10/30/2022 at 3:08 pm to provider DAVID WELLS , who verbally acknowledged these results. Electronically Signed   By: Allegra Lai M.D.   On: 10/30/2022 15:16   CT ABDOMEN PELVIS W CONTRAST  Result Date: 10/30/2022 CLINICAL DATA:  Cachectic with history of 60 pound weight loss EXAM: CT ANGIOGRAPHY CHEST CT ABDOMEN AND PELVIS WITH CONTRAST TECHNIQUE: Multidetector CT imaging of the chest was performed using the standard protocol during bolus administration of intravenous contrast. Multiplanar CT image reconstructions and MIPs were obtained to evaluate the vascular anatomy. Multidetector CT imaging of the abdomen and pelvis was performed using the standard protocol during bolus administration of intravenous contrast. RADIATION DOSE REDUCTION: This exam was performed according to the departmental dose-optimization program which includes automated exposure control, adjustment of the mA and/or kV according to patient size and/or use of iterative reconstruction  technique. CONTRAST:  75mL OMNIPAQUE IOHEXOL 350 MG/ML SOLN COMPARISON:  CT chest, abdomen and pelvis dated Aug 05, 2022 FINDINGS: CTA CHEST FINDINGS: Cardiovascular: Scattered subsegmental pulmonary emboli are seen in the bilateral lower lobes. Normal heart size. No CT evidence of right heart strain (0.7 ratio). No pericardial effusion. Normal caliber thoracic aorta with severe calcified plaque. Severe coronary artery calcifications status post CABG. Dilated main pulmonary artery, measuring up to 3.4 cm. Mediastinum/Nodes: Esophagus and thyroid are unremarkable. Enlarged mediastinal and right hilar nodes, increased in size. Reference right hilar lymph node measures 11 mm short axis, previously 11 mm. Precarinal lymph node measures 14 mm, previously 10 mm. Lungs/Pleura: Central airways are patent. Diffuse bronchial wall thickening with scattered areas of mucous plugging. Numerous pulmonary nodules some with cavitation, are seen throughout the lungs, but most severe in the upper lobes. New cavitary mass of the lingula measuring 6.0 x 3.6 cm on series 5, image 89, findings are otherwise similar. No pleural effusion. Musculoskeletal: Prior median sternotomy. No acute or significant osseous findings. Review of the MIP images confirms the above findings. CT ABDOMEN and PELVIS FINDINGS Hepatobiliary: Unchanged low-attenuation lesion of the central liver measuring 13 mm on series 2, image 18. No gallstones, gallbladder wall thickening, or biliary dilatation. Pancreas: Unremarkable. No pancreatic ductal dilatation or surrounding inflammatory changes. Spleen: Normal in size without focal abnormality. Adrenals/Urinary Tract: Bilateral adrenal glands are unremarkable. No hydronephrosis. Punctate nonobstructing stone of the left kidney. Thick-walled urinary bladder. Stomach/Bowel: Stomach is within normal limits. Appendix appears normal. No evidence of bowel wall thickening, distention, or inflammatory changes.  Vascular/Lymphatic: Abdominal aortic aneurysm measuring 4.3 by 3.8 cm, unchanged when compared with the prior exam. 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 No enlarged lymph nodes seen in the chest. Reproductive: Heterogeneous and enlarged prostate, similar to prior exam Other: No abdominal wall hernia or abnormality. No abdominopelvic ascites. Musculoskeletal: No acute or significant osseous findings. Review of the MIP images confirms the above findings. IMPRESSION: Chest CTA: 1. Scattered subsegmental pulmonary emboli in the bilateral lower lobes. 2. 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 Finding may be due to acute worsening of  underlying infection, although neoplasm can not be excluded. Short-term follow-up chest CT in 1-2 months is recommended to ensure resolution. 3. Enlarged mediastinal and right hilar nodes, increased in size, possibly reactive. Recommend attention on follow-up. Abdomen and pelvis CT: 1. No acute findings in the abdomen or pelvis. 2. Unchanged indeterminate low-attenuation lesion of the central liver measuring 13 mm. Could be completely characterized with abdominal MRI, this can be non emergently. 3. Stable abdominal aortic aneurysm measuring to 4.3 cm. Recommend follow-up every 12 months and vascular consultation. This recommendation follows ACR consensus guidelines: White Paper of the ACR Incidental Findings Committee II on Vascular Findings. J Am Coll Radiol 2013; 10:789-794. 4. 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 Thick-walled urinary bladder, likely due to chronic outlet obstruction given associated prostatomegaly. 5. 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 Critical Value/emergent results were called by telephone at the time of interpretation on 10/30/2022 at 3:08 pm to provider DAVID WELLS , who verbally acknowledged these results. Electronically  Signed   By: Allegra Lai M.D.   On: 10/30/2022 15:16   DG Chest Port 1 View  Result Date: 10/30/2022 CLINICAL DATA:  Sepsis. EXAM: PORTABLE CHEST 1 VIEW COMPARISON:  X-ray 02/10/2022.  CT 08/05/2022 FINDINGS: Status post median sternotomy. Normal cardiopericardial silhouette. Overlapping cardiac leads. Hyperinflation. Once again there is diffuse interstitial changes of the lungs with some patchy areas as well as bronchiectasis. The extent and distribution is similar to the prior x-ray in the right lung. There is slightly increasing opacity left midlung. Superimposed infiltrates possible. IMPRESSION: Hyperinflation with known chronic lung changes with interstitial changes and bronchiectasis. There is slightly more opacity in the left midlung. Please correlate for developing infiltrate and recommend follow-up Electronically Signed   By: Karen Kays M.D.   On: 10/30/2022 12:44             LOS: 1 day    Time spent: 50 min     Sunnie Nielsen, DO [facility] 10/31/2022, 1:29 PM    Dictation software may have been used to generate the above note. Typos may occur and escape review in typed/dictated notes. Please contact Dr Lyn Hollingshead directly for clarity if needed.  Staff may message me via secure chat in Epic  but this may not receive an immediate response,  please page me for urgent matters!  If 7PM-7AM, please contact night coverage [email or web address]

## 2022-10-31 NOTE — Progress Notes (Signed)
       CROSS COVER NOTE  NAME: Nicholas Alvarez MRN: [id number] DOB : January 15, 1941    Concern as stated by nurse / staff    Patient hemoglobin dropped from 9.1-7.2, on heparin gtt for Pe's. No signs of bleeding on assessment. Do you want a repeat h/h in a few hours?     Pertinent findings on chart review:   Assessment and  Interventions   Assessment: No active bleeding    10/31/2022    3:34 AM 10/31/2022   12:01 AM 10/30/2022   11:16 PM  Vitals with BMI  Systolic 112 110 119  Diastolic 67 59 50  Pulse 81 92 93    Plan: Repeat H & H 1100       Donnie Mesa NP [facility] Cross Cover 7pm-7am - check amion for availability Pager [phone]

## 2022-10-31 NOTE — Hospital Course (Addendum)
Nicholas Alvarez is a 82 y.o. 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 He was sent from ID office for worsening of leukocytosis, cough and weight loss. 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  Starting about 6 months ago, patient started to develop a cough, intermittently he will cough up some scanty phlegm and since then has had worsening of exertional dyspnea and lost of appetite and collectively he lost about 60 pounds.  2 weeks ago patient underwent a urology procedure, subsequently stopped taking almost all of his medications "feeling tired to take 100 pills a day" including the 3 antibiotics.   Yesterday, patient went to see  ID. WBC> 20,000 and was sent him to ED for further evaluation.  08/23: in ED, CTA incidental finding of bilateral PE. CT chest showed worsening and new cavity lesions likely recurrent MAI vs malignancy.  Pulmonary consulted, holding plavix in case needs bronch.  Palliative care consulted. Echo pending. DVT US pending. Neg COVID/Flu. Started on vanc + cefepime, heparin gtt.  08/24: Palliative following. Lactate, WBC, Plt improved. Hgb drop, transfusing 1 unit PRBC. D/w patient re: advanced care planning. DNR status, updated in chart. DVT neg. Echo pending. Per pulmonary, CT more c/w infection more so than cancer.  08/25: WBC trending down, Hgb improved. BCx NGx2d. Sputum Cx pending - never collected.  08/26: Echocardiogram: LVEF 30-35%, (+)RWMA, no evidence for R heart strain. Will d/c heparin gtt. Start eliquis. Continue IV abx, await pulm recs since sputum cx never sent.  08/27: doing well, abx plan as below, prednisone, hold cellcept, ok to go home. Pt wishes to follow outpatient w/ PCP to discuss CHF, he was instructed on s/s  exacerbation and lasix Rx sent.   Consultants:  Palliative care Pulmonology   Procedures: none      ASSESSMENT & PLAN:   Principal Problem:   PNA (pneumonia) Active Problems:   Sepsis (HCC)   Bronchiectasis (HCC)   MAI (mycobacterium avium-intracellulare) (HCC)   Sepsis d/t multifocal pneumonia Multifocal pneumonia Question of recurrent MAI pneumonia Restart MAI rx - zithromax, rifampin, ethambutol Hold cellcept  Prednisone 50 mg decrease by 5 mg weekly Follow w/ pulmonary and ID outpatient   Incidental bilateral subsegmental PE, unprovoked SIRS d/t PE vs sepsis d/t pneumonia but likely both - resolved Likely d/t ILD/Sjogren's DVT study negative Echocardiogram: LVEF 30-35%, (+)RWMA, no evidence for R heart strain  heparin drip d/c Continue w/ ELiquis D/c ASA  Chronic HFrEF New diagnosis on echo w/ LVEF 30-35%, (+)RWMA Does not appear to be in acute exacerbation, likely new dx of chronic problem D/w patient - he would like to discuss more w/ PCP Lasix prn ordered see med rec Strongly consider cardiology follow up to dicsuss further intervention vs prognosis / palliative measures  Acute on chronic hypoxemic respiratory failure d/t 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  No history of GI bleed  FOBT w/ next BM Consider outpatient follow-up with GI for colonoscopy  if desired    BPH Recent HoLEP study, and pathology showed benign tissue. Admitting hospitalist d/w urology Dr. Apolinar Junes, who is okay with anticoagulation and resume Plavix (holding plavix for  now in case needing bronchoscopy)  Finasteride, tamsulosin scheduled Oxybutinin prn    CAD Denied any chest pains Restart Plavix  aspirin stopped since he will be on eliquis dor PE  Holding beta blocker d/t borderline BP Holding statin    Severe protein calorie malnutrition Likely secondary to consuming conditions of chronic infection, management as above No history of seizure or intracranial disease, will start trial of Marinol  Regular diet  Start Ensure   Anxiety/depression Continue Remeron  Advanced care planning Detailed discussion 10/31/22 see hospitalist progress note Pt has capacity to understand his situation and give informed consent/refusal

## 2022-10-31 NOTE — Consult Note (Signed)
ANTICOAGULATION CONSULT NOTE   Pharmacy Consult for heparin infusion Indication: pulmonary embolus  Allergies  Allergen Reactions   Ace Inhibitors Cough    Patient Measurements: Height: 5\' 10"  (177.8 cm) Weight: 59.1 kg (130 lb 4.7 oz) IBW/kg (Calculated) : 73 Heparin Dosing Weight: 59.1 kg  Vital Signs: Temp: 97.7 F (36.5 C) (08/24 0935) Temp Source: Oral (08/24 0001) BP: 101/60 (08/24 0935) Pulse Rate: 92 (08/24 0935)  Labs: Recent Labs    10/30/22 1208 10/30/22 2322 10/31/22 0456 10/31/22 0908  HGB 9.1*  --  7.2*  --   HCT 30.2*  --  23.1*  --   PLT 518*  --  449*  --   APTT 35  --   --   --   LABPROT 14.9  --   --   --   INR 1.2  --   --   --   HEPARINUNFRC  --  <0.10*  --  <0.10*  CREATININE 1.03  --  0.82  --     Estimated Creatinine Clearance: 60.1 mL/min (by C-G formula based on SCr of 0.82 mg/dL).   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    Chronic anemia    COVID-19    Depression    Dyslipidemia    Dyspnea    ED (erectile dysfunction)    Elevated PSA    Falls    Gross hematuria    History of methicillin resistant staphylococcus aureus (MRSA) 02/2022   + PCR nasal swab   Hx of hypotension    was placed on midodrine and then taken off   Hypertension    Interstitial lung disease (HCC)    Ischemic cardiomyopathy    with left ventricular ejection fraction of 35%   Left ventricular aneurysm    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   Myocardial infarction (HCC) 2009   Pneumonia    Protein-calorie malnutrition, severe (HCC)    S/P CABG x 5 2010   Sepsis due to pneumonia (HCC)    Sjogren's syndrome (HCC)    lung involvment    Medications:  No home anticoagulation per pharmacist  review  Assessment: 82 yo male presented to ED due to abnormal labs (elevated WBC).  Patient found to have PE on CT.  Pharmacy consulted to initiate heparin infusion.  08/23 2322 HL <0.10, subtherapeutic  8/24  0908 HL <0.10,  subtherapeutic  Goal of Therapy:  Heparin level 0.3-0.7 units/ml Monitor platelets by anticoagulation protocol: Yes   Plan:  8/24  0908 HL <0.10,  subtherapeutic Per MAR it looks like drip was stopped ~0300 but per RN today 8/24 the drip has been running during his shift at 1150 units/hr but he did switch IV sites. Patient did have drop in Hgb yesterday 9.1>>7.2 Will Increase Heparin infusion to 1250 units/hr Hgb recheck ordered for 1100 Next HL in 8 hours Daily CBC while on Heparin drip  Bari Mantis PharmD Clinical Pharmacist 10/31/2022

## 2022-10-31 NOTE — Consult Note (Signed)
ANTICOAGULATION CONSULT NOTE   Pharmacy Consult for heparin infusion Indication: pulmonary embolus  Allergies  Allergen Reactions   Ace Inhibitors Cough    Patient Measurements: Height: 5\' 10"  (177.8 cm) Weight: 59.1 kg (130 lb 4.7 oz) IBW/kg (Calculated) : 73 Heparin Dosing Weight: 59.1 kg  Vital Signs: Temp: 98.5 F (36.9 C) (08/24 0001) Temp Source: Oral (08/24 0001) BP: 110/59 (08/24 0001) Pulse Rate: 92 (08/24 0001)  Labs: Recent Labs    10/30/22 1208 10/30/22 2322  HGB 9.1*  --   HCT 30.2*  --   PLT 518*  --   APTT 35  --   LABPROT 14.9  --   INR 1.2  --   HEPARINUNFRC  --  <0.10*  CREATININE 1.03  --     Estimated Creatinine Clearance: 47.8 mL/min (by C-G formula based on SCr of 1.03 mg/dL).   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    Chronic anemia    COVID-19    Depression    Dyslipidemia    Dyspnea    ED (erectile dysfunction)    Elevated PSA    Falls    Gross hematuria    History of methicillin resistant staphylococcus aureus (MRSA) 02/2022   + PCR nasal swab   Hx of hypotension    was placed on midodrine and then taken off   Hypertension    Interstitial lung disease (HCC)    Ischemic cardiomyopathy    with left ventricular ejection fraction of 35%   Left ventricular aneurysm    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   Myocardial infarction (HCC) 2009   Pneumonia    Protein-calorie malnutrition, severe (HCC)    S/P CABG x 5 2010   Sepsis due to pneumonia (HCC)    Sjogren's syndrome (HCC)    lung involvment    Medications:  No home anticoagulation per pharmacist review  Assessment: 82 yo male presented to ED due to abnormal labs (elevated WBC).  Patient found to have PE on CT.   Pharmacy consulted to initiate heparin infusion.  08/23 2322 HL <0.10, subtherapeutic  Goal of Therapy:  Heparin level 0.3-0.7 units/ml Monitor platelets by anticoagulation protocol: Yes   Plan:  Heparin bolus of 1800 units Increase Heparin infusion to 1150 units/hr Next HL in 8 hours Daily CBC while on Heparin drip  Clovia Cuff, PharmD, BCPS 10/31/2022 12:53 AM

## 2022-10-31 NOTE — Progress Notes (Signed)
Pharmacy Antibiotic Note  Nicholas Alvarez is a 82 y.o. male admitted on 10/30/2022 with severe cavitary pneumonia .  Pharmacy has been consulted for Cefepime dosing.  Plan: Will start cefepime 2g IV every 8 hours for CrCl >60  Pharmacy will continue to monitor and adjust as needed.   Height: 5\' 10"  (177.8 cm) Weight: 59.1 kg (130 lb 4.7 oz) IBW/kg (Calculated) : 73  Temp (24hrs), Avg:98.1 F (36.7 C), Min:97.7 F (36.5 C), Max:98.5 F (36.9 C)  Recent Labs  Lab 10/30/22 1208 10/30/22 1516 10/31/22 0456  WBC 18.8*  --  13.3*  CREATININE 1.03  --  0.82  LATICACIDVEN 2.3* 1.6  --     Estimated Creatinine Clearance: 60.1 mL/min (by C-G formula based on SCr of 0.82 mg/dL).    Allergies  Allergen Reactions   Ace Inhibitors Cough   Antimicrobials this admission: 8/24 Cefepime>>  8/23 Zosyn >> 8/24 8/23 vancomycin >> x1   Microbiology results: 8/23 BCx: pending 8/24 MRSA PCR: pending 8/24 AFB: pending   Thank you for allowing pharmacy to be a part of this patient's care.  Gardner Candle, PharmD, BCPS Clinical Pharmacist 10/31/2022 12:55 PM

## 2022-11-01 DIAGNOSIS — I2694 Multiple subsegmental pulmonary emboli without acute cor pulmonale: Secondary | ICD-10-CM | POA: Diagnosis not present

## 2022-11-01 DIAGNOSIS — A310 Pulmonary mycobacterial infection: Secondary | ICD-10-CM | POA: Diagnosis not present

## 2022-11-01 DIAGNOSIS — J189 Pneumonia, unspecified organism: Secondary | ICD-10-CM | POA: Diagnosis not present

## 2022-11-01 DIAGNOSIS — A419 Sepsis, unspecified organism: Secondary | ICD-10-CM | POA: Diagnosis not present

## 2022-11-01 DIAGNOSIS — J470 Bronchiectasis with acute lower respiratory infection: Secondary | ICD-10-CM | POA: Diagnosis not present

## 2022-11-01 LAB — CBC
HCT: 26.5 % — ABNORMAL LOW (ref 39.0–52.0)
Hemoglobin: 8.5 g/dL — ABNORMAL LOW (ref 13.0–17.0)
MCH: 26.6 pg (ref 26.0–34.0)
MCHC: 32.1 g/dL (ref 30.0–36.0)
MCV: 83.1 fL (ref 80.0–100.0)
Platelets: 416 10*3/uL — ABNORMAL HIGH (ref 150–400)
RBC: 3.19 MIL/uL — ABNORMAL LOW (ref 4.22–5.81)
RDW: 14.9 % (ref 11.5–15.5)
WBC: 12.1 10*3/uL — ABNORMAL HIGH (ref 4.0–10.5)
nRBC: 0 % (ref 0.0–0.2)

## 2022-11-01 LAB — TYPE AND SCREEN
ABO/RH(D): O POS
Antibody Screen: NEGATIVE
Unit division: 0

## 2022-11-01 LAB — HEPARIN LEVEL (UNFRACTIONATED)
Heparin Unfractionated: <0.1 [IU]/mL — ABNORMAL LOW (ref 0.30–0.70)
Heparin Unfractionated: 0.1 [IU]/mL — ABNORMAL LOW (ref 0.30–0.70)

## 2022-11-01 LAB — BPAM RBC
Blood Product Expiration Date: 202409162359
ISSUE DATE / TIME: 202408241608
Unit Type and Rh: 5100

## 2022-11-01 MED ORDER — HEPARIN BOLUS VIA INFUSION
1750.0000 [IU] | Freq: Once | INTRAVENOUS | Status: AC
  Administered 2022-11-01: 1750 [IU] via INTRAVENOUS
  Filled 2022-11-01: qty 1750

## 2022-11-01 MED ORDER — HEPARIN BOLUS VIA INFUSION
1800.0000 [IU] | Freq: Once | INTRAVENOUS | Status: AC
  Administered 2022-11-01: 1800 [IU] via INTRAVENOUS
  Filled 2022-11-01: qty 1800

## 2022-11-01 NOTE — Progress Notes (Incomplete)
PROGRESS NOTE    Nicholas Alvarez   HWE:[id number] DOB: 1940/06/08  DOA: 10/30/2022 Date of Service: 11/01/22 PCP: Kandyce Rud, MD     Brief Narrative / Hospital Course:  Nicholas Alvarez is a 82 y.o. 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 He was sent from ID office for worsening of leukocytosis, cough and weight loss. 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  Starting about 6 months ago, patient started to develop a cough, intermittently he will cough up some scanty phlegm and since then has had worsening of exertional dyspnea and lost of appetite and collectively he lost about 60 pounds.  2 weeks ago patient underwent a urology procedure, subsequently stopped taking almost all of his medications "feeling tired to take 100 pills a day" including the 3 antibiotics.   Yesterday, patient went to see  ID. WBC> 20,000 and was sent him to ED for further evaluation.  08/23: in ED, CTA incidental finding of bilateral PE. CT chest showed worsening and new cavity lesions likely recurrent MAI infection versus underlying malignancy.  Pulmonary consulted, holding plavix in case needs bronch.  Palliative care consulted. Echo pending. DVT US pending. Neg COVID/Flu. Started on vanc + cefepime, heparin gtt.  08/24: Palliative following. Lactate, WBC, Plt improved. Hgb drop probably some hemodilutional but following HH closely and transfusing 1 unit PRBC. D/w patient re: advanced care planning - he'd like to pursue diagnosis/treatment but would NOT want resuscitative measures in the event of cardiac/respiratory arrest. DNR status updated in chart. DVT neg. Echo pending. Per pulmonary, CT more c/w infection more so than cancer.  08/25: WBC trending down, Hgb improved.  BCx NGx2d. Sputum Cx pending. Per Pulm ***. PT/OT ***.  Consultants:  Palliative care Pulmonology   Procedures: none      ASSESSMENT & PLAN:   Principal Problem:   PNA (pneumonia) Active Problems:   Sepsis (HCC)   Bronchiectasis (HCC)   MAI (mycobacterium avium-intracellulare) (HCC)   Sepsis d/t multifocal pneumonia Multifocal pneumonia Question of recurrent MAI pneumonia Cefepime + Zosyn Sputum culture  Pulmonary following here  Per ID (admitting hospitalist spoke to pt's ID team see H&P), hold off anti-MAI treatment for now (hold home ethambutol, azithromycin, rifampin), patient has been followed since last year and the most recent sputum culture was negative for MAI sputum culture pending AFB culture pending ID consult ***   Acute on chronic hypoxemic respiratory failure d/t 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, multifocal pneumonia, pulmonary embolus  Supplemental O2  NO intubation/mechanical ventilation  Supportive care including breathing treatment bronchodilator, guaifenesin, flutter valve  Chronic bronchiectasis  Mucomyst 20% nebulized 4 mL twice daily   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  No history of GI bleed  FOBT w/ next BM Consider outpatient follow-up with GI for colonoscopy   BPH Recent HoLEP study, and pathology showed benign tissue. Admitting hospitalist d/w urology Dr. Apolinar Junes, who is okay with anticoagulation and resume Plavix (  holding plavix for now in case needing bronchoscopy)  Finasteride, tamsulosin scheduled Oxybutinin prn     CAD Denied any chest pains hold off Plavix for possible incoming bronchoscopy aspirin for now Holding beta blocker d/t borderline BP Holding statin    Severe protein calorie malnutrition Likely secondary to consuming conditions of chronic infection, management as above As per recommendation from ID physician, will consult palliative care No history of seizure or intracranial disease, will start trial of Marinol  Regular diet  Start Ensure   Anxiety/depression Continue Remeron  Advanced care planning Detailed discussion w/ patient at bedside, palliative care team member Ladona Ridgel was present as well, appreciate her help. Decision made for continue current plan of care - he is open to invasive testing such as bronchoscopy to help diagnose and guide treatment, however pt would not want CPR/intubation.  DNR status updated in orders and communicated to RN, will d/w family as well later today if needed.  Pt has capacity to understand his situation and give informed consent/refusal   DVT prophylaxis: on heparin gtt  Pertinent IV fluids/nutrition: regular diet, d/c continuous IV fluids  Central lines / invasive devices: none  Code Status: DNR ACP documentation reviewed: none on file   Current Admission Status: inpatient  TOC needs / Dispo plan: TBD Barriers to discharge / significant pending items: clinical improvement +.- bronchoscopy anticipate will be here another 3-4 days at least.              Subjective / Brief ROS:  Patient reports breathing is ok today, his ribs hurt some from coughing, cough is bothersome but not intolerable  Denies SOB.  Pain controlled.  Denies new weakness.  Tolerating diet but pleasantly notes that his food at home is better .  Reports no concerns w/ urination/defecation.   Family Communication: will d/w family later today     Objective Findings:  Vitals:   10/31/22 2018 10/31/22 2338 11/01/22 0359 11/01/22 0731  BP: 92/64 104/61  126/79 119/70  Pulse: 94 76 94 77  Resp: 18 19 19 15   Temp: 99 F (37.2 C) 99.1 F (37.3 C) 98.8 F (37.1 C) 98.3 F (36.8 C)  TempSrc: Oral   Oral  SpO2: 97% 93% 92% 96%  Weight:      Height:        Intake/Output Summary (Last 24 hours) at 11/01/2022 0830 Last data filed at 11/01/2022 0981 Gross per 24 hour  Intake 1092.25 ml  Output --  Net 1092.25 ml   Filed Weights   10/30/22 1141  Weight: 59.1 kg    Examination:  Physical Exam Constitutional:      General: He is not in acute distress.    Appearance: He is underweight. He is not toxic-appearing.  Cardiovascular:     Rate and Rhythm: Normal rate and regular rhythm.  Pulmonary:     Effort: Pulmonary effort is normal. No respiratory distress.     Comments: Coarse sounds bilaterally but no severe wheezing/rhonchi  Abdominal:     Palpations: Abdomen is soft.  Musculoskeletal:     Right lower leg: No edema.     Left lower leg: No edema.  Neurological:     General: No focal deficit present.     Mental Status: He is alert and oriented to person, place, and time. Mental status is at baseline.  Psychiatric:        Mood and Affect: Mood normal.        Behavior: Behavior normal.  Thought Content: Thought content normal.        Judgment: Judgment normal.          Scheduled Medications:   acetylcysteine  3 mL Nebulization TID   aspirin EC  81 mg Oral Daily   dronabinol  2.5 mg Oral BID AC   feeding supplement  237 mL Oral BID BM   finasteride  5 mg Oral BH-q7a   guaiFENesin  600 mg Oral BID   ipratropium-albuterol  3 mL Nebulization TID   mirtazapine  15 mg Oral QHS   mycophenolate  500 mg Oral BH-q7a   tamsulosin  0.4 mg Oral QPC breakfast    Continuous Infusions:  ceFEPime (MAXIPIME) IV 200 mL/hr at 11/01/22 0625   heparin 1,450 Units/hr (11/01/22 0625)    PRN Medications:  albuterol, HYDROcodone-acetaminophen, oxybutynin  Antimicrobials from admission:  Anti-infectives (From admission,  onward)    Start     Dose/Rate Route Frequency Ordered Stop   10/31/22 1400  ceFEPIme (MAXIPIME) 2 g in sodium chloride 0.9 % 100 mL IVPB        2 g 200 mL/hr over 30 Minutes Intravenous Every 8 hours 10/31/22 1252     10/30/22 2200  piperacillin-tazobactam (ZOSYN) IVPB 3.375 g  Status:  Discontinued        3.375 g 12.5 mL/hr over 240 Minutes Intravenous Every 8 hours 10/30/22 1630 10/31/22 1234   10/30/22 1245  vancomycin (VANCOREADY) IVPB 1250 mg/250 mL        1,250 mg 166.7 mL/hr over 90 Minutes Intravenous  Once 10/30/22 1239 10/30/22 1554   10/30/22 1245  ceFEPIme (MAXIPIME) 2 g in sodium chloride 0.9 % 100 mL IVPB        2 g 200 mL/hr over 30 Minutes Intravenous  Once 10/30/22 1239 10/30/22 1355           Data Reviewed:  I have personally reviewed the following...  CBC: Recent Labs  Lab 10/30/22 1208 10/31/22 0456 10/31/22 1242 10/31/22 2055 11/01/22 0635  WBC 18.8* 13.3*  --   --  12.1*  NEUTROABS 16.7*  --   --   --   --   HGB 9.1* 7.2* 6.6* 7.7* 8.5*  HCT 30.2* 23.1* 21.2* 24.2* 26.5*  MCV 86.3 83.7  --   --  83.1  PLT 518* 449*  --   --  416*   Basic Metabolic Panel: Recent Labs  Lab 10/30/22 1208 10/31/22 0456  NA 137 139  K 3.9 3.5  CL 104 108  CO2 23 24  GLUCOSE 143* 99  BUN 28* 22  CREATININE 1.03 0.82  CALCIUM 9.4 8.5*  MG 2.2  --    GFR: Estimated Creatinine Clearance: 60.1 mL/min (by C-G formula based on SCr of 0.82 mg/dL). Liver Function Tests: Recent Labs  Lab 10/30/22 1208  AST 23  ALT 11  ALKPHOS 67  BILITOT 0.3  PROT 7.3  ALBUMIN 2.7*   No results for input(s): "LIPASE", "AMYLASE" in the last 168 hours. No results for input(s): "AMMONIA" in the last 168 hours. Coagulation Profile: Recent Labs  Lab 10/30/22 1208  INR 1.2   Cardiac Enzymes: No results for input(s): "CKTOTAL", "CKMB", "CKMBINDEX", "TROPONINI" in the last 168 hours. BNP (last 3 results) No results for input(s): "PROBNP" in the last 8760  hours. HbA1C: No results for input(s): "HGBA1C" in the last 72 hours. CBG: No results for input(s): "GLUCAP" in the last 168 hours. Lipid Profile: No results for input(s): "CHOL", "HDL", "LDLCALC", "TRIG", "CHOLHDL", "  LDLDIRECT" in the last 72 hours. Thyroid Function Tests: Recent Labs    10/30/22 1206  TSH 2.161   Anemia Panel: Recent Labs    10/30/22 1208 10/30/22 1630  TIBC  --  134*  IRON  --  15*  RETICCTPCT 1.2  --    Most Recent Urinalysis On File:     Component Value Date/Time   COLORURINE YELLOW (A) 02/11/2022 0258   APPEARANCEUR Cloudy (A) 10/01/2022 1414   LABSPEC 1.028 02/11/2022 0258   LABSPEC 1.006 12/16/2012 0322   PHURINE 5.0 02/11/2022 0258   GLUCOSEU Negative 10/01/2022 1414   GLUCOSEU Negative 12/16/2012 0322   HGBUR NEGATIVE 02/11/2022 0258   BILIRUBINUR Negative 10/01/2022 1414   BILIRUBINUR Negative 12/16/2012 0322   KETONESUR 20 (A) 02/11/2022 0258   PROTEINUR 3+ (A) 10/01/2022 1414   PROTEINUR 30 (A) 02/11/2022 0258   UROBILINOGEN 0.2 03/15/2008 1651   NITRITE Positive (A) 10/01/2022 1414   NITRITE NEGATIVE 02/11/2022 0258   LEUKOCYTESUR 2+ (A) 10/01/2022 1414   LEUKOCYTESUR NEGATIVE 02/11/2022 0258   LEUKOCYTESUR Trace 12/16/2012 0322   Sepsis Labs: @LABRCNTIP (procalcitonin:4,lacticidven:4) Microbiology: Recent Results (from the past 240 hour(s))  Resp panel by RT-PCR (RSV, Flu A&B, Covid) Anterior Nasal Swab     Status: None   Collection Time: 10/30/22 12:08 PM   Specimen: Anterior Nasal Swab  Result Value Ref Range Status   SARS Coronavirus 2 by RT PCR NEGATIVE NEGATIVE Final    Comment: (NOTE) SARS-CoV-2 target nucleic acids are NOT DETECTED.  The SARS-CoV-2 RNA is generally detectable in upper respiratory specimens during the acute phase of infection. The lowest concentration of SARS-CoV-2 viral copies this assay can detect is 138 copies/mL. A negative result does not preclude SARS-Cov-2 infection and should not be used as the  sole basis for treatment or other patient management decisions. A negative result may occur with  improper specimen collection/handling, submission of specimen other than nasopharyngeal swab, presence of viral mutation(s) within the areas targeted by this assay, and inadequate number of viral copies(<138 copies/mL). A negative result must be combined with clinical observations, patient history, and epidemiological information. The expected result is Negative.  Fact Sheet for Patients:  BloggerCourse.com  Fact Sheet for Healthcare Providers:  SeriousBroker.it  This test is no t yet approved or cleared by the [place] FDA and  has been authorized for detection and/or diagnosis of SARS-CoV-2 by FDA under an Emergency Use Authorization (EUA). This EUA will remain  in effect (meaning this test can be used) for the duration of the COVID-19 declaration under Section 564(b)(1) of the Act, 21 U.S.C.section 360bbb-3(b)(1), unless the authorization is terminated  or revoked sooner.       Influenza A by PCR NEGATIVE NEGATIVE Final   Influenza B by PCR NEGATIVE NEGATIVE Final    Comment: (NOTE) The Xpert Xpress SARS-CoV-2/FLU/RSV plus assay is intended as an aid in the diagnosis of influenza from Nasopharyngeal swab specimens and should not be used as a sole basis for treatment. Nasal washings and aspirates are unacceptable for Xpert Xpress SARS-CoV-2/FLU/RSV testing.  Fact Sheet for Patients: BloggerCourse.com  Fact Sheet for Healthcare Providers: SeriousBroker.it  This test is not yet approved or cleared by the [place] FDA and has been authorized for detection and/or diagnosis of SARS-CoV-2 by FDA under an Emergency Use Authorization (EUA). This EUA will remain in effect (meaning this test can be used) for the duration of the COVID-19 declaration under Section 564(b)(1) of the  Act, 21 U.S.C. section 360bbb-3(b)(1), unless  the authorization is terminated or revoked.     Resp Syncytial Virus by PCR NEGATIVE NEGATIVE Final    Comment: (NOTE) Fact Sheet for Patients: BloggerCourse.com  Fact Sheet for Healthcare Providers: SeriousBroker.it  This test is not yet approved or cleared by the [place] FDA and has been authorized for detection and/or diagnosis of SARS-CoV-2 by FDA under an Emergency Use Authorization (EUA). This EUA will remain in effect (meaning this test can be used) for the duration of the COVID-19 declaration under Section 564(b)(1) of the Act, 21 U.S.C. section 360bbb-3(b)(1), unless the authorization is terminated or revoked.  Performed at [facility], [address] [place], [place] [address]   Blood Culture (routine x 2)     Status: None (Preliminary result)   Collection Time: 10/30/22 12:08 PM   Specimen: BLOOD  Result Value Ref Range Status   Specimen Description BLOOD BLOOD LEFT ARM  Final   Special Requests   Final    BOTTLES DRAWN AEROBIC AND ANAEROBIC Blood Culture adequate volume   Culture   Final    NO GROWTH 2 DAYS Performed at [facility], [address] [place], [place] [address]    Report Status PENDING  Incomplete  Blood Culture (routine x 2)     Status: None (Preliminary result)   Collection Time: 10/30/22 12:08 PM   Specimen: BLOOD  Result Value Ref Range Status   Specimen Description BLOOD BLOOD RIGHT ARM  Final   Special Requests   Final    BOTTLES DRAWN AEROBIC AND ANAEROBIC Blood Culture adequate volume   Culture   Final    NO GROWTH 2 DAYS Performed at [facility], [address], [place], [place] [address]    Report Status PENDING  Incomplete      Radiology Studies last 3 days: US Venous Img Lower Bilateral (DVT)  Result Date: 10/30/2022 CLINICAL DATA:  Pulmonary embolism. Evaluate for lower extremity DVT. EXAM:  BILATERAL LOWER EXTREMITY VENOUS DOPPLER ULTRASOUND TECHNIQUE: Gray-scale sonography with graded compression, as well as color Doppler and duplex ultrasound were performed to evaluate the lower extremity deep venous systems from the level of the common femoral vein and including the common femoral, femoral, profunda femoral, popliteal and calf veins including the posterior tibial, peroneal and gastrocnemius veins when visible. The superficial great saphenous vein was also interrogated. Spectral Doppler was utilized to evaluate flow at rest and with distal augmentation maneuvers in the common femoral, femoral and popliteal veins. COMPARISON:  None Available. FINDINGS: RIGHT LOWER EXTREMITY Common Femoral Vein: No evidence of thrombus. Normal compressibility, respiratory phasicity and response to augmentation. Saphenofemoral Junction: No evidence of thrombus. Normal compressibility and flow on color Doppler imaging. Profunda Femoral Vein: No evidence of thrombus. Normal compressibility and flow on color Doppler imaging. Femoral Vein: No evidence of thrombus. Normal compressibility, respiratory phasicity and response to augmentation. Popliteal Vein: No evidence of thrombus. Normal compressibility, respiratory phasicity and response to augmentation. Calf Veins: No evidence of thrombus. Normal compressibility and flow on color Doppler imaging. Superficial Great Saphenous Vein: No evidence of thrombus. Normal compressibility. Other Findings:  None. LEFT LOWER EXTREMITY Common Femoral Vein: No evidence of thrombus. Normal compressibility, respiratory phasicity and response to augmentation. Saphenofemoral Junction: No evidence of thrombus. Normal compressibility and flow on color Doppler imaging. Profunda Femoral Vein: No evidence of thrombus. Normal compressibility and flow on color Doppler imaging. Femoral Vein: No evidence of thrombus. Normal compressibility, respiratory phasicity and response to augmentation. Popliteal  Vein: No evidence of  thrombus. Normal compressibility, respiratory phasicity and response to augmentation. Calf Veins: No evidence of thrombus. Normal compressibility and flow on color Doppler imaging. Superficial Great Saphenous Vein: No evidence of thrombus. Normal compressibility. Other Findings:  None. IMPRESSION: No evidence of DVT within either lower extremity. Electronically Signed   By: Simonne Come M.D.   On: 10/30/2022 17:27   CT Angio Chest PE W and/or Wo Contrast  Result Date: 10/30/2022 CLINICAL DATA:  Cachectic with history of 60 pound weight loss EXAM: CT ANGIOGRAPHY CHEST CT ABDOMEN AND PELVIS WITH CONTRAST TECHNIQUE: Multidetector CT imaging of the chest was performed using the standard protocol during bolus administration of intravenous contrast. Multiplanar CT image reconstructions and MIPs were obtained to evaluate the vascular anatomy. Multidetector CT imaging of the abdomen and pelvis was performed using the standard protocol during bolus administration of intravenous contrast. RADIATION DOSE REDUCTION: This exam was performed according to the departmental dose-optimization program which includes automated exposure control, adjustment of the mA and/or kV according to patient size and/or use of iterative reconstruction technique. CONTRAST:  75mL OMNIPAQUE IOHEXOL 350 MG/ML SOLN COMPARISON:  CT chest, abdomen and pelvis dated Aug 05, 2022 FINDINGS: CTA CHEST FINDINGS: Cardiovascular: Scattered subsegmental pulmonary emboli are seen in the bilateral lower lobes. Normal heart size. No CT evidence of right heart strain (0.7 ratio). No pericardial effusion. Normal caliber thoracic aorta with severe calcified plaque. Severe coronary artery calcifications status post CABG. Dilated main pulmonary artery, measuring up to 3.4 cm. Mediastinum/Nodes: Esophagus and thyroid are unremarkable. Enlarged mediastinal and right hilar nodes, increased in size. Reference right hilar lymph node measures 11 mm short  axis, previously 11 mm. Precarinal lymph node measures 14 mm, previously 10 mm. Lungs/Pleura: Central airways are patent. Diffuse bronchial wall thickening with scattered areas of mucous plugging. Numerous pulmonary nodules some with cavitation, are seen throughout the lungs, but most severe in the upper lobes. New cavitary mass of the lingula measuring 6.0 x 3.6 cm on series 5, image 89, findings are otherwise similar. No pleural effusion. Musculoskeletal: Prior median sternotomy. No acute or significant osseous findings. Review of the MIP images confirms the above findings. CT ABDOMEN and PELVIS FINDINGS Hepatobiliary: Unchanged low-attenuation lesion of the central liver measuring 13 mm on series 2, image 18. No gallstones, gallbladder wall thickening, or biliary dilatation. Pancreas: Unremarkable. No pancreatic ductal dilatation or surrounding inflammatory changes. Spleen: Normal in size without focal abnormality. Adrenals/Urinary Tract: Bilateral adrenal glands are unremarkable. No hydronephrosis. Punctate nonobstructing stone of the left kidney. Thick-walled urinary bladder. Stomach/Bowel: Stomach is within normal limits. Appendix appears normal. No evidence of bowel wall thickening, distention, or inflammatory changes. Vascular/Lymphatic: Abdominal aortic aneurysm measuring 4.3 by 3.8 cm, unchanged when compared with the prior exam. 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 No enlarged lymph nodes seen in the chest. Reproductive: Heterogeneous and enlarged prostate, similar to prior exam Other: No abdominal wall hernia or abnormality. No abdominopelvic ascites. Musculoskeletal: No acute or significant osseous findings. Review of the MIP images confirms the above findings. IMPRESSION: Chest CTA: 1. Scattered subsegmental pulmonary emboli in the bilateral lower lobes. 2. New cavitary mass of the lingula with similar background findings of consistent with chronic atypical infection (likely  MAI). Finding may be due to acute worsening of underlying infection, although neoplasm can not be excluded. Short-term follow-up chest CT in 1-2 months is recommended to ensure resolution. 3. Enlarged mediastinal and right hilar nodes, increased in size, possibly reactive. Recommend attention on follow-up. Abdomen and pelvis  CT: 1. No acute findings in the abdomen or pelvis. 2. Unchanged indeterminate low-attenuation lesion of the central liver measuring 13 mm. Could be completely characterized with abdominal MRI, this can be non emergently. 3. Stable abdominal aortic aneurysm measuring to 4.3 cm. Recommend follow-up every 12 months and vascular consultation. This recommendation follows ACR consensus guidelines: White Paper of the ACR Incidental Findings Committee II on Vascular Findings. J Am Coll Radiol 2013; 10:789-794. 4. 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 Thick-walled urinary bladder, likely due to chronic outlet obstruction given associated prostatomegaly. 5. 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 Critical Value/emergent results were called by telephone at the time of interpretation on 10/30/2022 at 3:08 pm to provider DAVID WELLS , who verbally acknowledged these results. Electronically Signed   By: Allegra Lai M.D.   On: 10/30/2022 15:16   CT ABDOMEN PELVIS W CONTRAST  Result Date: 10/30/2022 CLINICAL DATA:  Cachectic with history of 60 pound weight loss EXAM: CT ANGIOGRAPHY CHEST CT ABDOMEN AND PELVIS WITH CONTRAST TECHNIQUE: Multidetector CT imaging of the chest was performed using the standard protocol during bolus administration of intravenous contrast. Multiplanar CT image reconstructions and MIPs were obtained to evaluate the vascular anatomy. Multidetector CT imaging of the abdomen and pelvis was performed using the standard protocol during bolus administration of intravenous contrast. RADIATION DOSE REDUCTION: This exam was performed according to the  departmental dose-optimization program which includes automated exposure control, adjustment of the mA and/or kV according to patient size and/or use of iterative reconstruction technique. CONTRAST:  75mL OMNIPAQUE IOHEXOL 350 MG/ML SOLN COMPARISON:  CT chest, abdomen and pelvis dated Aug 05, 2022 FINDINGS: CTA CHEST FINDINGS: Cardiovascular: Scattered subsegmental pulmonary emboli are seen in the bilateral lower lobes. Normal heart size. No CT evidence of right heart strain (0.7 ratio). No pericardial effusion. Normal caliber thoracic aorta with severe calcified plaque. Severe coronary artery calcifications status post CABG. Dilated main pulmonary artery, measuring up to 3.4 cm. Mediastinum/Nodes: Esophagus and thyroid are unremarkable. Enlarged mediastinal and right hilar nodes, increased in size. Reference right hilar lymph node measures 11 mm short axis, previously 11 mm. Precarinal lymph node measures 14 mm, previously 10 mm. Lungs/Pleura: Central airways are patent. Diffuse bronchial wall thickening with scattered areas of mucous plugging. Numerous pulmonary nodules some with cavitation, are seen throughout the lungs, but most severe in the upper lobes. New cavitary mass of the lingula measuring 6.0 x 3.6 cm on series 5, image 89, findings are otherwise similar. No pleural effusion. Musculoskeletal: Prior median sternotomy. No acute or significant osseous findings. Review of the MIP images confirms the above findings. CT ABDOMEN and PELVIS FINDINGS Hepatobiliary: Unchanged low-attenuation lesion of the central liver measuring 13 mm on series 2, image 18. No gallstones, gallbladder wall thickening, or biliary dilatation. Pancreas: Unremarkable. No pancreatic ductal dilatation or surrounding inflammatory changes. Spleen: Normal in size without focal abnormality. Adrenals/Urinary Tract: Bilateral adrenal glands are unremarkable. No hydronephrosis. Punctate nonobstructing stone of the left kidney. Thick-walled  urinary bladder. Stomach/Bowel: Stomach is within normal limits. Appendix appears normal. No evidence of bowel wall thickening, distention, or inflammatory changes. Vascular/Lymphatic: Abdominal aortic aneurysm measuring 4.3 by 3.8 cm, unchanged when compared with the prior exam. 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 No enlarged lymph nodes seen in the chest. Reproductive: Heterogeneous and enlarged prostate, similar to prior exam Other: No abdominal wall hernia or abnormality. No abdominopelvic ascites. Musculoskeletal: No acute or significant osseous findings. Review of the MIP images confirms the above  findings. IMPRESSION: Chest CTA: 1. Scattered subsegmental pulmonary emboli in the bilateral lower lobes. 2. 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 Finding may be due to acute worsening of underlying infection, although neoplasm can not be excluded. Short-term follow-up chest CT in 1-2 months is recommended to ensure resolution. 3. Enlarged mediastinal and right hilar nodes, increased in size, possibly reactive. Recommend attention on follow-up. Abdomen and pelvis CT: 1. No acute findings in the abdomen or pelvis. 2. Unchanged indeterminate low-attenuation lesion of the central liver measuring 13 mm. Could be completely characterized with abdominal MRI, this can be non emergently. 3. Stable abdominal aortic aneurysm measuring to 4.3 cm. Recommend follow-up every 12 months and vascular consultation. This recommendation follows ACR consensus guidelines: White Paper of the ACR Incidental Findings Committee II on Vascular Findings. J Am Coll Radiol 2013; 10:789-794. 4. 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 Thick-walled urinary bladder, likely due to chronic outlet obstruction given associated prostatomegaly. 5. 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 Critical Value/emergent results  were called by telephone at the time of interpretation on 10/30/2022 at 3:08 pm to provider DAVID WELLS , who verbally acknowledged these results. Electronically Signed   By: Allegra Lai M.D.   On: 10/30/2022 15:16   DG Chest Port 1 View  Result Date: 10/30/2022 CLINICAL DATA:  Sepsis. EXAM: PORTABLE CHEST 1 VIEW COMPARISON:  X-ray 02/10/2022.  CT 08/05/2022 FINDINGS: Status post median sternotomy. Normal cardiopericardial silhouette. Overlapping cardiac leads. Hyperinflation. Once again there is diffuse interstitial changes of the lungs with some patchy areas as well as bronchiectasis. The extent and distribution is similar to the prior x-ray in the right lung. There is slightly increasing opacity left midlung. Superimposed infiltrates possible. IMPRESSION: Hyperinflation with known chronic lung changes with interstitial changes and bronchiectasis. There is slightly more opacity in the left midlung. Please correlate for developing infiltrate and recommend follow-up Electronically Signed   By: Karen Kays M.D.   On: 10/30/2022 12:44             LOS: 2 days    Time spent: 50 min     Sunnie Nielsen, DO [facility] 11/01/2022, 8:30 AM    Dictation software may have been used to generate the above note. Typos may occur and escape review in typed/dictated notes. Please contact Dr Lyn Hollingshead directly for clarity if needed.  Staff may message me via secure chat in Epic  but this may not receive an immediate response,  please page me for urgent matters!  If 7PM-7AM, please contact night coverage [email or web address]

## 2022-11-01 NOTE — Consult Note (Signed)
ANTICOAGULATION CONSULT NOTE   Pharmacy Consult for heparin infusion Indication: pulmonary embolus  Allergies  Allergen Reactions   Ace Inhibitors Cough    Patient Measurements: Height: 5\' 10"  (177.8 cm) Weight: 59.1 kg (130 lb 4.7 oz) IBW/kg (Calculated) : 73 Heparin Dosing Weight: 59.1 kg  Vital Signs: Temp: 98.3 F (36.8 C) (08/25 0731) Temp Source: Oral (08/25 0731) BP: 119/70 (08/25 0731) Pulse Rate: 77 (08/25 0731)  Labs: Recent Labs    10/30/22 1208 10/30/22 2322 10/31/22 0456 10/31/22 0908 10/31/22 1242 10/31/22 2055 11/01/22 0635  HGB 9.1*  --  7.2*  --  6.6* 7.7* 8.5*  HCT 30.2*  --  23.1*  --  21.2* 24.2* 26.5*  PLT 518*  --  449*  --   --   --  416*  APTT 35  --   --   --   --   --   --   LABPROT 14.9  --   --   --   --   --   --   INR 1.2  --   --   --   --   --   --   HEPARINUNFRC  --  <0.10*  --  <0.10*  --  <0.10*  --   CREATININE 1.03  --  0.82  --   --   --   --     Estimated Creatinine Clearance: 60.1 mL/min (by C-G formula based on SCr of 0.82 mg/dL).   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    Chronic anemia    COVID-19    Depression    Dyslipidemia    Dyspnea    ED (erectile dysfunction)    Elevated PSA    Falls    Gross hematuria    History of methicillin resistant staphylococcus aureus (MRSA) 02/2022   + PCR nasal swab   Hx of hypotension    was placed on midodrine and then taken off   Hypertension    Interstitial lung disease (HCC)    Ischemic cardiomyopathy    with left ventricular ejection fraction of 35%   Left ventricular aneurysm    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  Patient found to have PE on CT.  Pharmacy consulted to initiate heparin infusion.  8/23  2322 HL <0.10, subtherapeutic  8/24  0908 HL <0.10,  subtherapeutic 8/24  2055 HL <0.10,  SUBtherapeutic  8/25  0817 HL <0.10,  subtherapeutic  (note there was a delay in lab reporting-received result at ~1315)  Goal of Therapy:  Heparin level 0.3-0.7 units/ml Monitor platelets by anticoagulation protocol: Yes   8/25:  HL @ 0817= < 0.1, SUBtherapeutic again? -had RN go into room to verify drip is running. Will change to new bag at this time - Will order heparin 1800 units IV X 1 bolus and increase drip rate to 1700 units/hr  Next HL in 8 hours Daily CBC while on Heparin drip  Amedee Cerrone A Clinical Pharmacist 11/01/2022

## 2022-11-01 NOTE — Progress Notes (Signed)
PROGRESS NOTE    Nicholas Alvarez   WUJ:[id number] DOB: [birth year]  DOA: 10/30/2022 Date of Service: 11/01/22 PCP: Kandyce Rud, MD     Brief Narrative / Hospital Course:  Nicholas Alvarez is a 82 y.o. 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 He was sent from ID office for worsening of leukocytosis, cough and weight loss. 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  Starting about 6 months ago, patient started to develop a cough, intermittently he will cough up some scanty phlegm and since then has had worsening of exertional dyspnea and lost of appetite and collectively he lost about 60 pounds.  2 weeks ago patient underwent a urology procedure, subsequently stopped taking almost all of his medications "feeling tired to take 100 pills a day" including the 3 antibiotics.   Yesterday, patient went to see  ID. WBC> 20,000 and was sent him to ED for further evaluation.  08/23: in ED, CTA incidental finding of bilateral PE. CT chest showed worsening and new cavity lesions likely recurrent MAI infection versus underlying malignancy.  Pulmonary consulted, holding plavix in case needs bronch.  Palliative care consulted. Echo pending. DVT US pending. Neg COVID/Flu. Started on vanc + cefepime, heparin gtt.  08/24: Palliative following. Lactate, WBC, Plt improved. Hgb drop probably some hemodilutional but following HH closely and transfusing 1 unit PRBC. D/w patient re: advanced care planning - he'd like to pursue diagnosis/treatment but would NOT want resuscitative measures in the event of cardiac/respiratory arrest. DNR status updated in chart. DVT neg. Echo pending. Per pulmonary, CT more c/w infection more so than cancer.  08/25: WBC trending down, Hgb improved.  BCx NGx2d. Sputum Cx pending. Pulm recs pending. PT/OT to see.  Consultants:  Palliative care Pulmonology   Procedures: none      ASSESSMENT & PLAN:   Principal Problem:   PNA (pneumonia) Active Problems:   Sepsis (HCC)   Bronchiectasis (HCC)   MAI (mycobacterium avium-intracellulare) (HCC)   Sepsis d/t multifocal pneumonia Multifocal pneumonia Question of recurrent MAI pneumonia Cefepime + Zosyn per pulmonary Sputum culture  Pulmonary following here  Per patient's ID physician (admitting hospitalist spoke to pt's ID team see H&P), hold off anti-MAI treatment for now (hold home ethambutol, azithromycin, rifampin), patient has been followed since last year and the most recent sputum culture was negative for MAI sputum culture pending AFB culture pending Consider ID consult    Acute on chronic hypoxemic respiratory failure d/t 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, multifocal pneumonia, pulmonary embolus  Supplemental O2 as needed but he has been on room air  NO intubation/mechanical ventilation  Supportive care including breathing treatment bronchodilator, guaifenesin, flutter valve  Chronic bronchiectasis  Mucomyst 20% nebulized 4 mL twice daily   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  No history of GI bleed  FOBT w/ next BM Consider outpatient follow-up with GI for colonoscopy    BPH Recent HoLEP study, and pathology showed benign tissue. Admitting hospitalist d/w urology Dr. Apolinar Junes, who is okay with anticoagulation and resume Plavix (holding plavix for now in case needing bronchoscopy)  Finasteride, tamsulosin scheduled Oxybutinin prn    CAD Denied any chest pains hold off Plavix for possible incoming bronchoscopy aspirin for now Holding beta blocker d/t borderline BP Holding statin    Severe protein calorie malnutrition Likely secondary to consuming conditions of chronic infection, management as above As per recommendation from ID physician, will consult palliative care No history of seizure or intracranial disease, will start trial of Marinol  Regular diet  Start Ensure   Anxiety/depression Continue Remeron  Advanced care planning Detailed discussion 10/31/22 see hospitalist progress note Pt has capacity to understand his situation and give informed consent/refusal   DVT prophylaxis: on heparin gtt  Pertinent IV fluids/nutrition: regular diet, d/c continuous IV fluids  Central lines / invasive devices: none  Code Status: DNR ACP documentation reviewed: none on file   Current Admission Status: inpatient  TOC needs / Dispo plan: TBD Barriers to discharge / significant pending items: clinical improvement +.- culture results, anticipate will be here another 3-4 days at least.              Subjective / Brief ROS:  Patient reports breathing is ok today Not needing O2 Cough is bothersome but not intolerable  Denies SOB.  Pain controlled.  Denies new weakness.    Family Communication: will d/w family later today     Objective Findings:  Vitals:   10/31/22 2338 11/01/22 0359 11/01/22 0731 11/01/22 1100  BP: 104/61 126/79 119/70 117/77  Pulse: 76 94 77 81  Resp: 19 19 15 18   Temp: 99.1 F (37.3 C) 98.8 F (37.1 C) 98.3 F (36.8 C) 98.3 F (36.8 C)  TempSrc:   Oral Oral  SpO2: 93% 92% 96% 98%  Weight:      Height:         Intake/Output Summary (Last 24 hours) at 11/01/2022 1419 Last data filed at 11/01/2022 2956 Gross per 24 hour  Intake 1092.25 ml  Output --  Net 1092.25 ml   Filed Weights   10/30/22 1141  Weight: 59.1 kg    Examination:  Physical Exam Constitutional:      General: He is not in acute distress.    Appearance: He is underweight. He is not toxic-appearing.  Cardiovascular:     Rate and Rhythm: Normal rate and regular rhythm.  Pulmonary:     Effort: Pulmonary effort is normal. No respiratory distress.     Comments: Coarse sounds bilaterally but no severe wheezing/rhonchi  Abdominal:     Palpations: Abdomen is soft.  Musculoskeletal:     Right lower leg: No edema.     Left lower leg: No edema.  Neurological:     General: No focal deficit present.     Mental Status: He is alert and oriented to person, place, and time. Mental status is at baseline.  Psychiatric:        Mood and Affect: Mood normal.        Behavior: Behavior normal.        Thought Content: Thought content normal.        Judgment: Judgment normal.          Scheduled Medications:   acetylcysteine  3 mL Nebulization TID   aspirin EC  81 mg Oral Daily   dronabinol  2.5 mg Oral BID AC   feeding supplement  237 mL Oral BID BM   finasteride  5 mg Oral BH-q7a   guaiFENesin  600 mg Oral BID   ipratropium-albuterol  3 mL Nebulization TID   mirtazapine  15 mg Oral QHS   mycophenolate  500 mg Oral BH-q7a   tamsulosin  0.4 mg Oral QPC breakfast    Continuous Infusions:  ceFEPime (MAXIPIME) IV 2 g (11/01/22 1333)   heparin 1,700 Units/hr (11/01/22 1339)    PRN Medications:  albuterol, HYDROcodone-acetaminophen, oxybutynin  Antimicrobials from admission:  Anti-infectives (From admission, onward)    Start     Dose/Rate Route Frequency Ordered Stop   10/31/22 1400  ceFEPIme (MAXIPIME) 2 g in sodium chloride 0.9 % 100 mL IVPB        2 g 200 mL/hr over 30 Minutes Intravenous Every 8 hours 10/31/22 1252      10/30/22 2200  piperacillin-tazobactam (ZOSYN) IVPB 3.375 g  Status:  Discontinued        3.375 g 12.5 mL/hr over 240 Minutes Intravenous Every 8 hours 10/30/22 1630 10/31/22 1234   10/30/22 1245  vancomycin (VANCOREADY) IVPB 1250 mg/250 mL        1,250 mg 166.7 mL/hr over 90 Minutes Intravenous  Once 10/30/22 1239 10/30/22 1554   10/30/22 1245  ceFEPIme (MAXIPIME) 2 g in sodium chloride 0.9 % 100 mL IVPB        2 g 200 mL/hr over 30 Minutes Intravenous  Once 10/30/22 1239 10/30/22 1355           Data Reviewed:  I have personally reviewed the following...  CBC: Recent Labs  Lab 10/30/22 1208 10/31/22 0456 10/31/22 1242 10/31/22 2055 11/01/22 0635  WBC 18.8* 13.3*  --   --  12.1*  NEUTROABS 16.7*  --   --   --   --   HGB 9.1* 7.2* 6.6* 7.7* 8.5*  HCT 30.2* 23.1* 21.2* 24.2* 26.5*  MCV 86.3 83.7  --   --  83.1  PLT 518* 449*  --   --  416*   Basic Metabolic Panel: Recent Labs  Lab 10/30/22 1208 10/31/22 0456  NA 137 139  K 3.9 3.5  CL 104 108  CO2 23 24  GLUCOSE 143* 99  BUN 28* 22  CREATININE 1.03 0.82  CALCIUM 9.4 8.5*  MG 2.2  --    GFR: Estimated Creatinine Clearance: 60.1 mL/min (by C-G formula based on SCr of 0.82 mg/dL). Liver Function Tests: Recent Labs  Lab 10/30/22 1208  AST 23  ALT 11  ALKPHOS 67  BILITOT 0.3  PROT 7.3  ALBUMIN 2.7*   No results for input(s): "LIPASE", "AMYLASE" in the last 168 hours. No results for input(s): "AMMONIA" in the last 168 hours. Coagulation Profile: Recent Labs  Lab 10/30/22 1208  INR 1.2   Cardiac Enzymes: No results for input(s): "CKTOTAL", "CKMB", "CKMBINDEX", "TROPONINI" in the last 168 hours. BNP (last 3 results) No results for input(s): "PROBNP" in the last 8760 hours. HbA1C: No results for input(s): "HGBA1C" in the last 72 hours. CBG: No results for input(s): "GLUCAP" in the last 168 hours. Lipid Profile: No results for input(s): "CHOL", "HDL", "LDLCALC", "TRIG", "CHOLHDL", "LDLDIRECT" in  the last 72 hours. Thyroid Function Tests: Recent Labs    10/30/22 1206  TSH 2.161   Anemia Panel: Recent Labs    10/30/22 1208 10/30/22  1630  TIBC  --  134*  IRON  --  15*  RETICCTPCT 1.2  --    Most Recent Urinalysis On File:     Component Value Date/Time   COLORURINE YELLOW (A) 02/11/2022 0258   APPEARANCEUR Cloudy (A) 10/01/2022 1414   LABSPEC 1.028 02/11/2022 0258   LABSPEC 1.006 12/16/2012 0322   PHURINE 5.0 02/11/2022 0258   GLUCOSEU Negative 10/01/2022 1414   GLUCOSEU Negative 12/16/2012 0322   HGBUR NEGATIVE 02/11/2022 0258   BILIRUBINUR Negative 10/01/2022 1414   BILIRUBINUR Negative 12/16/2012 0322   KETONESUR 20 (A) 02/11/2022 0258   PROTEINUR 3+ (A) 10/01/2022 1414   PROTEINUR 30 (A) 02/11/2022 0258   UROBILINOGEN 0.2 03/15/2008 1651   NITRITE Positive (A) 10/01/2022 1414   NITRITE NEGATIVE 02/11/2022 0258   LEUKOCYTESUR 2+ (A) 10/01/2022 1414   LEUKOCYTESUR NEGATIVE 02/11/2022 0258   LEUKOCYTESUR Trace 12/16/2012 0322   Sepsis Labs: @LABRCNTIP (procalcitonin:4,lacticidven:4) Microbiology: Recent Results (from the past 240 hour(s))  Resp panel by RT-PCR (RSV, Flu A&B, Covid) Anterior Nasal Swab     Status: None   Collection Time: 10/30/22 12:08 PM   Specimen: Anterior Nasal Swab  Result Value Ref Range Status   SARS Coronavirus 2 by RT PCR NEGATIVE NEGATIVE Final    Comment: (NOTE) SARS-CoV-2 target nucleic acids are NOT DETECTED.  The SARS-CoV-2 RNA is generally detectable in upper respiratory specimens during the acute phase of infection. The lowest concentration of SARS-CoV-2 viral copies this assay can detect is 138 copies/mL. A negative result does not preclude SARS-Cov-2 infection and should not be used as the sole basis for treatment or other patient management decisions. A negative result may occur with  improper specimen collection/handling, submission of specimen other than nasopharyngeal swab, presence of viral mutation(s) within  the areas targeted by this assay, and inadequate number of viral copies(<138 copies/mL). A negative result must be combined with clinical observations, patient history, and epidemiological information. The expected result is Negative.  Fact Sheet for Patients:  BloggerCourse.com  Fact Sheet for Healthcare Providers:  SeriousBroker.it  This test is no t yet approved or cleared by the [place] FDA and  has been authorized for detection and/or diagnosis of SARS-CoV-2 by FDA under an Emergency Use Authorization (EUA). This EUA will remain  in effect (meaning this test can be used) for the duration of the COVID-19 declaration under Section 564(b)(1) of the Act, 21 U.S.C.section 360bbb-3(b)(1), unless the authorization is terminated  or revoked sooner.       Influenza A by PCR NEGATIVE NEGATIVE Final   Influenza B by PCR NEGATIVE NEGATIVE Final    Comment: (NOTE) The Xpert Xpress SARS-CoV-2/FLU/RSV plus assay is intended as an aid in the diagnosis of influenza from Nasopharyngeal swab specimens and should not be used as a sole basis for treatment. Nasal washings and aspirates are unacceptable for Xpert Xpress SARS-CoV-2/FLU/RSV testing.  Fact Sheet for Patients: BloggerCourse.com  Fact Sheet for Healthcare Providers: SeriousBroker.it  This test is not yet approved or cleared by the [place] FDA and has been authorized for detection and/or diagnosis of SARS-CoV-2 by FDA under an Emergency Use Authorization (EUA). This EUA will remain in effect (meaning this test can be used) for the duration of the COVID-19 declaration under Section 564(b)(1) of the Act, 21 U.S.C. section 360bbb-3(b)(1), unless the authorization is terminated or revoked.     Resp Syncytial Virus by PCR NEGATIVE NEGATIVE Final    Comment: (NOTE) Fact Sheet for  Patients: BloggerCourse.com  Fact Sheet  for Healthcare Providers: SeriousBroker.it  This test is not yet approved or cleared by the [place] and has been authorized for detection and/or diagnosis of SARS-CoV-2 by FDA under an Emergency Use Authorization (EUA). This EUA will remain in effect (meaning this test can be used) for the duration of the COVID-19 declaration under Section 564(b)(1) of the Act, 21 U.S.C. section 360bbb-3(b)(1), unless the authorization is terminated or revoked.  Performed at [facility], [address] [place], [place] [address]   Blood Culture (routine x 2)     Status: None (Preliminary result)   Collection Time: 10/30/22 12:08 PM   Specimen: BLOOD  Result Value Ref Range Status   Specimen Description BLOOD BLOOD LEFT ARM  Final   Special Requests   Final    BOTTLES DRAWN AEROBIC AND ANAEROBIC Blood Culture adequate volume   Culture   Final    NO GROWTH 2 DAYS Performed at [facility], [address], [place], [place] [address]    Report Status PENDING  Incomplete  Blood Culture (routine x 2)     Status: None (Preliminary result)   Collection Time: 10/30/22 12:08 PM   Specimen: BLOOD  Result Value Ref Range Status   Specimen Description BLOOD BLOOD RIGHT ARM  Final   Special Requests   Final    BOTTLES DRAWN AEROBIC AND ANAEROBIC Blood Culture adequate volume   Culture   Final    NO GROWTH 2 DAYS Performed at [facility], [address] [place], [place] [address]    Report Status PENDING  Incomplete      Radiology Studies last 3 days: US Venous Img Lower Bilateral (DVT)  Result Date: 10/30/2022 CLINICAL DATA:  Pulmonary embolism. Evaluate for lower extremity DVT. EXAM: BILATERAL LOWER EXTREMITY VENOUS DOPPLER ULTRASOUND TECHNIQUE: Gray-scale sonography with graded compression, as well as color Doppler and duplex ultrasound were performed to evaluate the  lower extremity deep venous systems from the level of the common femoral vein and including the common femoral, femoral, profunda femoral, popliteal and calf veins including the posterior tibial, peroneal and gastrocnemius veins when visible. The superficial great saphenous vein was also interrogated. Spectral Doppler was utilized to evaluate flow at rest and with distal augmentation maneuvers in the common femoral, femoral and popliteal veins. COMPARISON:  None Available. FINDINGS: RIGHT LOWER EXTREMITY Common Femoral Vein: No evidence of thrombus. Normal compressibility, respiratory phasicity and response to augmentation. Saphenofemoral Junction: No evidence of thrombus. Normal compressibility and flow on color Doppler imaging. Profunda Femoral Vein: No evidence of thrombus. Normal compressibility and flow on color Doppler imaging. Femoral Vein: No evidence of thrombus. Normal compressibility, respiratory phasicity and response to augmentation. Popliteal Vein: No evidence of thrombus. Normal compressibility, respiratory phasicity and response to augmentation. Calf Veins: No evidence of thrombus. Normal compressibility and flow on color Doppler imaging. Superficial Great Saphenous Vein: No evidence of thrombus. Normal compressibility. Other Findings:  None. LEFT LOWER EXTREMITY Common Femoral Vein: No evidence of thrombus. Normal compressibility, respiratory phasicity and response to augmentation. Saphenofemoral Junction: No evidence of thrombus. Normal compressibility and flow on color Doppler imaging. Profunda Femoral Vein: No evidence of thrombus. Normal compressibility and flow on color Doppler imaging. Femoral Vein: No evidence of thrombus. Normal compressibility, respiratory phasicity and response to augmentation. Popliteal Vein: No evidence of thrombus. Normal compressibility, respiratory phasicity and response to augmentation. Calf Veins: No evidence of thrombus. Normal compressibility and flow on color  Doppler imaging. Superficial Great Saphenous Vein: No evidence of thrombus.  Normal compressibility. Other Findings:  None. IMPRESSION: No evidence of DVT within either lower extremity. Electronically Signed   By: Simonne Come M.D.   On: 10/30/2022 17:27   CT Angio Chest PE W and/or Wo Contrast  Result Date: 10/30/2022 CLINICAL DATA:  Cachectic with history of 60 pound weight loss EXAM: CT ANGIOGRAPHY CHEST CT ABDOMEN AND PELVIS WITH CONTRAST TECHNIQUE: Multidetector CT imaging of the chest was performed using the standard protocol during bolus administration of intravenous contrast. Multiplanar CT image reconstructions and MIPs were obtained to evaluate the vascular anatomy. Multidetector CT imaging of the abdomen and pelvis was performed using the standard protocol during bolus administration of intravenous contrast. RADIATION DOSE REDUCTION: This exam was performed according to the departmental dose-optimization program which includes automated exposure control, adjustment of the mA and/or kV according to patient size and/or use of iterative reconstruction technique. CONTRAST:  75mL OMNIPAQUE IOHEXOL 350 MG/ML SOLN COMPARISON:  CT chest, abdomen and pelvis dated Aug 05, 2022 FINDINGS: CTA CHEST FINDINGS: Cardiovascular: Scattered subsegmental pulmonary emboli are seen in the bilateral lower lobes. Normal heart size. No CT evidence of right heart strain (0.7 ratio). No pericardial effusion. Normal caliber thoracic aorta with severe calcified plaque. Severe coronary artery calcifications status post CABG. Dilated main pulmonary artery, measuring up to 3.4 cm. Mediastinum/Nodes: Esophagus and thyroid are unremarkable. Enlarged mediastinal and right hilar nodes, increased in size. Reference right hilar lymph node measures 11 mm short axis, previously 11 mm. Precarinal lymph node measures 14 mm, previously 10 mm. Lungs/Pleura: Central airways are patent. Diffuse bronchial wall thickening with scattered areas of  mucous plugging. Numerous pulmonary nodules some with cavitation, are seen throughout the lungs, but most severe in the upper lobes. New cavitary mass of the lingula measuring 6.0 x 3.6 cm on series 5, image 89, findings are otherwise similar. No pleural effusion. Musculoskeletal: Prior median sternotomy. No acute or significant osseous findings. Review of the MIP images confirms the above findings. CT ABDOMEN and PELVIS FINDINGS Hepatobiliary: Unchanged low-attenuation lesion of the central liver measuring 13 mm on series 2, image 18. No gallstones, gallbladder wall thickening, or biliary dilatation. Pancreas: Unremarkable. No pancreatic ductal dilatation or surrounding inflammatory changes. Spleen: Normal in size without focal abnormality. Adrenals/Urinary Tract: Bilateral adrenal glands are unremarkable. No hydronephrosis. Punctate nonobstructing stone of the left kidney. Thick-walled urinary bladder. Stomach/Bowel: Stomach is within normal limits. Appendix appears normal. No evidence of bowel wall thickening, distention, or inflammatory changes. Vascular/Lymphatic: Abdominal aortic aneurysm measuring 4.3 by 3.8 cm, unchanged when compared with the prior exam. 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 No enlarged lymph nodes seen in the chest. Reproductive: Heterogeneous and enlarged prostate, similar to prior exam Other: No abdominal wall hernia or abnormality. No abdominopelvic ascites. Musculoskeletal: No acute or significant osseous findings. Review of the MIP images confirms the above findings. IMPRESSION: Chest CTA: 1. Scattered subsegmental pulmonary emboli in the bilateral lower lobes. 2. 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 Finding may be due to acute worsening of underlying infection, although neoplasm can not be excluded. Short-term follow-up chest CT in 1-2 months is recommended to ensure  resolution. 3. Enlarged mediastinal and right hilar nodes, increased in size, possibly reactive. Recommend attention on follow-up. Abdomen and pelvis CT: 1. No acute findings in the abdomen or pelvis. 2. Unchanged indeterminate low-attenuation lesion of the central liver measuring 13 mm. Could be completely characterized with abdominal MRI, this can  be non emergently. 3. Stable abdominal aortic aneurysm measuring to 4.3 cm. Recommend follow-up every 12 months and vascular consultation. This recommendation follows ACR consensus guidelines: White Paper of the ACR Incidental Findings Committee II on Vascular Findings. J Am Coll Radiol 2013; 10:789-794. 4. 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 Thick-walled urinary bladder, likely due to chronic outlet obstruction given associated prostatomegaly. 5. 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 Critical Value/emergent results were called by telephone at the time of interpretation on 10/30/2022 at 3:08 pm to provider DAVID WELLS , who verbally acknowledged these results. Electronically Signed   By: Allegra Lai M.D.   On: 10/30/2022 15:16   CT ABDOMEN PELVIS W CONTRAST  Result Date: 10/30/2022 CLINICAL DATA:  Cachectic with history of 60 pound weight loss EXAM: CT ANGIOGRAPHY CHEST CT ABDOMEN AND PELVIS WITH CONTRAST TECHNIQUE: Multidetector CT imaging of the chest was performed using the standard protocol during bolus administration of intravenous contrast. Multiplanar CT image reconstructions and MIPs were obtained to evaluate the vascular anatomy. Multidetector CT imaging of the abdomen and pelvis was performed using the standard protocol during bolus administration of intravenous contrast. RADIATION DOSE REDUCTION: This exam was performed according to the departmental dose-optimization program which includes automated exposure control, adjustment of the mA and/or kV according to patient size and/or use of iterative reconstruction  technique. CONTRAST:  75mL OMNIPAQUE IOHEXOL 350 MG/ML SOLN COMPARISON:  CT chest, abdomen and pelvis dated Aug 05, 2022 FINDINGS: CTA CHEST FINDINGS: Cardiovascular: Scattered subsegmental pulmonary emboli are seen in the bilateral lower lobes. Normal heart size. No CT evidence of right heart strain (0.7 ratio). No pericardial effusion. Normal caliber thoracic aorta with severe calcified plaque. Severe coronary artery calcifications status post CABG. Dilated main pulmonary artery, measuring up to 3.4 cm. Mediastinum/Nodes: Esophagus and thyroid are unremarkable. Enlarged mediastinal and right hilar nodes, increased in size. Reference right hilar lymph node measures 11 mm short axis, previously 11 mm. Precarinal lymph node measures 14 mm, previously 10 mm. Lungs/Pleura: Central airways are patent. Diffuse bronchial wall thickening with scattered areas of mucous plugging. Numerous pulmonary nodules some with cavitation, are seen throughout the lungs, but most severe in the upper lobes. New cavitary mass of the lingula measuring 6.0 x 3.6 cm on series 5, image 89, findings are otherwise similar. No pleural effusion. Musculoskeletal: Prior median sternotomy. No acute or significant osseous findings. Review of the MIP images confirms the above findings. CT ABDOMEN and PELVIS FINDINGS Hepatobiliary: Unchanged low-attenuation lesion of the central liver measuring 13 mm on series 2, image 18. No gallstones, gallbladder wall thickening, or biliary dilatation. Pancreas: Unremarkable. No pancreatic ductal dilatation or surrounding inflammatory changes. Spleen: Normal in size without focal abnormality. Adrenals/Urinary Tract: Bilateral adrenal glands are unremarkable. No hydronephrosis. Punctate nonobstructing stone of the left kidney. Thick-walled urinary bladder. Stomach/Bowel: Stomach is within normal limits. Appendix appears normal. No evidence of bowel wall thickening, distention, or inflammatory changes.  Vascular/Lymphatic: Abdominal aortic aneurysm measuring 4.3 by 3.8 cm, unchanged when compared with the prior exam. 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 No enlarged lymph nodes seen in the chest. Reproductive: Heterogeneous and enlarged prostate, similar to prior exam Other: No abdominal wall hernia or abnormality. No abdominopelvic ascites. Musculoskeletal: No acute or significant osseous findings. Review of the MIP images confirms the above findings. IMPRESSION: Chest CTA: 1. Scattered subsegmental pulmonary emboli in the bilateral lower lobes. 2. 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 Finding may be due to acute worsening of underlying infection, although neoplasm can not be excluded. Short-term follow-up chest CT in 1-2 months is recommended to ensure resolution. 3. Enlarged mediastinal and right hilar nodes, increased in size, possibly reactive. Recommend attention on follow-up. Abdomen and pelvis CT: 1. No acute findings in the abdomen or pelvis. 2. Unchanged indeterminate low-attenuation lesion of the central liver measuring 13 mm. Could be completely characterized with abdominal MRI, this can be non emergently. 3. Stable abdominal aortic aneurysm measuring to 4.3 cm. Recommend follow-up every 12 months and vascular consultation. This recommendation follows ACR consensus guidelines: White Paper of the ACR Incidental Findings Committee II on Vascular Findings. J Am Coll Radiol 2013; 10:789-794. 4. 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 Thick-walled urinary bladder, likely due to chronic outlet obstruction given associated prostatomegaly. 5. 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 Critical Value/emergent results were called by telephone at the time of interpretation on 10/30/2022 at 3:08 pm to provider DAVID WELLS , who verbally acknowledged these results. Electronically  Signed   By: Allegra Lai M.D.   On: 10/30/2022 15:16   DG Chest Port 1 View  Result Date: 10/30/2022 CLINICAL DATA:  Sepsis. EXAM: PORTABLE CHEST 1 VIEW COMPARISON:  X-ray 02/10/2022.  CT 08/05/2022 FINDINGS: Status post median sternotomy. Normal cardiopericardial silhouette. Overlapping cardiac leads. Hyperinflation. Once again there is diffuse interstitial changes of the lungs with some patchy areas as well as bronchiectasis. The extent and distribution is similar to the prior x-ray in the right lung. There is slightly increasing opacity left midlung. Superimposed infiltrates possible. IMPRESSION: Hyperinflation with known chronic lung changes with interstitial changes and bronchiectasis. There is slightly more opacity in the left midlung. Please correlate for developing infiltrate and recommend follow-up Electronically Signed   By: Karen Kays M.D.   On: 10/30/2022 12:44             LOS: 2 days       Sunnie Nielsen, DO [facility] 11/01/2022, 2:19 PM    Dictation software may have been used to generate the above note. Typos may occur and escape review in typed/dictated notes. Please contact Dr Lyn Hollingshead directly for clarity if needed.  Staff may message me via secure chat in Epic  but this may not receive an immediate response,  please page me for urgent matters!  If 7PM-7AM, please contact night coverage [email or web address]

## 2022-11-01 NOTE — Consult Note (Signed)
ANTICOAGULATION CONSULT NOTE   Pharmacy Consult for heparin infusion Indication: pulmonary embolus  Allergies  Allergen Reactions   Ace Inhibitors Cough    Patient Measurements: Height: 5\' 10"  (177.8 cm) Weight: 59.1 kg (130 lb 4.7 oz) IBW/kg (Calculated) : 73 Heparin Dosing Weight: 59.1 kg  Vital Signs: Temp: 97.4 F (36.3 C) (08/25 1536) Temp Source: Oral (08/25 1536) BP: 112/69 (08/25 1536) Pulse Rate: 86 (08/25 1536)  Labs: Recent Labs    10/30/22 1208 10/30/22 2322 10/31/22 0456 10/31/22 0908 10/31/22 1242 10/31/22 2055 11/01/22 0635 11/01/22 0817 11/01/22 2103  HGB 9.1*  --  7.2*  --  6.6* 7.7* 8.5*  --   --   HCT 30.2*  --  23.1*  --  21.2* 24.2* 26.5*  --   --   PLT 518*  --  449*  --   --   --  416*  --   --   APTT 35  --   --   --   --   --   --   --   --   LABPROT 14.9  --   --   --   --   --   --   --   --   INR 1.2  --   --   --   --   --   --   --   --   HEPARINUNFRC  --    < >  --    < >  --  <0.10*  --  <0.10* 0.10*  CREATININE 1.03  --  0.82  --   --   --   --   --   --    < > = values in this interval not displayed.    Estimated Creatinine Clearance: 60.1 mL/min (by C-G formula based on SCr of 0.82 mg/dL).   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    Chronic anemia    COVID-19    Depression    Dyslipidemia    Dyspnea    ED (erectile dysfunction)    Elevated PSA    Falls    Gross hematuria    History of methicillin resistant staphylococcus aureus (MRSA) 02/2022   + PCR nasal swab   Hx of hypotension    was placed on midodrine and then taken off   Hypertension    Interstitial lung disease (HCC)    Ischemic cardiomyopathy    with left ventricular ejection fraction of 35%   Left ventricular aneurysm    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  Patient found to have PE on CT.  Pharmacy consulted to initiate heparin infusion.  8/23  2322 HL <0.10, subtherapeutic  8/24  0908 HL <0.10,  subtherapeutic 8/24  2055 HL <0.10,  SUBtherapeutic  8/25  0817 HL <0.10,  subtherapeutic  (note there was a delay in lab  reporting-received result at ~1315) 8/25  2111 HL   0.10,  SUBtherapeutic   Goal of Therapy:  Heparin level 0.3-0.7 units/ml Monitor platelets by anticoagulation protocol: Yes   8/25:  HL @ 2111 = 0.10, SUBtherapeutic - Will order heparin 1750 units IV X 1 bolus and increase drip rate to 1900 units/hr Next HL in 8 hours Daily CBC while on Heparin drip  Nochum Fenter D Clinical Pharmacist 11/01/2022

## 2022-11-01 NOTE — Progress Notes (Signed)
Daily Progress Note   Patient Name: Nicholas Alvarez       Date: 11/01/2022 DOB: 20-May-1940  Age: 82 y.o. MRN#: [id number] Attending Physician: Sunnie Nielsen, DO Primary Care Physician: Kandyce Rud, MD Admit Date: 10/30/2022  Reason for Consultation/Follow-up: Establishing goals of care  HPI/Brief Hospital Review: 82 y.o. 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   Reportedly stopped taking all medications including suppressive triple antibiotic therapy after a recent urology procedure. Developed worsening productive cough and worsening leukocytosis found during ID follow-up.   ED course CTA incidental finding of bilateral PE-started on Heparin gtt IMPRESSION: Chest CTA: 1. Scattered subsegmental pulmonary emboli in the bilateral lower lobes. 2. 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 Finding may be due to acute worsening of underlying infection, although neoplasm can not be excluded. Short-term follow-up chest CT in 1-2 months is recommended to ensure resolution. 3. Enlarged mediastinal and right hilar nodes, increased in size, possibly reactive. Recommend attention on follow-up.   Admitted and being treated with antibiotics for suspected worsening infection-pending pulmonology referral for recommendations-possible bronchoscopy   Palliative medicine was consulted for assisting with goals of care conversations.  Subjective: Extensive chart review has been completed prior to  meeting patient including labs, vital signs, imaging, progress notes, orders, and available advanced directive documents from current and previous encounters.    Visited with Nicholas Alvarez at his bedside. Awake, alert, finishing breakfast. He complains of overall not feeling well, cannot point out one specific complaint. He denies pain. Cough is about the same. He was unable to sleep well last night due to distractions.  He can recall visit from pulmonology yesterday but he is unclear of the plan moving forward. Shared plan remains at this time to treat his infection with current antibiotic therapy. He agrees with continuing with current plan. Goals remain clear at this time-high risk for decompensation.  Answered and addressed all questions and concerns. PMT to continue to follow as needed for support and ongoing goals of care conversations.   Thank you for allowing the Palliative Medicine Team to  assist in the care of this patient.  Total time:  25 minutes  Time spent includes: Detailed review of medical records (labs, imaging, vital signs), medically appropriate exam (mental status, respiratory, cardiac, skin), discussed with treatment team, counseling and educating patient, family and staff, documenting clinical information, medication management and coordination of care.  Leeanne Deed, DNP, AGNP-C Palliative Medicine   Please contact Palliative Medicine Team phone at [phone] for questions and concerns.

## 2022-11-01 NOTE — Evaluation (Signed)
Occupational Therapy Evaluation Patient Details Name: Nicholas Alvarez MRN: [id number] DOB: December 09, 1940 Today's Date: 11/01/2022   History of Present Illness Pt is an 82 year old male admitted from  ID office on 10/30/2022 with worsening leukocytosis, worsening cough and ongoing weight loss. 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 Nurse cleared pt for evaluation. Pt is alert and oriented x4, increased time for processing noted. PTA pt report he amb with SPC PRN household/community distances, requires assist for LB dressing from wife due to incontinence, UB dressing, grooming, feeding with MOD I. Pt has assist for IADLs from wife, does not drive, cook, or clean. Pt performs bed mobility with supervision, short amb transfer to bedside chair without AD with CGA. MAX A required for LB dressing including donning/doffing underwear. MOD A required for toileting with use of urinal.  Pt endorses fear of falling at home, educated on use of AD for further distances as needed. Pt presents with deficits in strength, endurance, activity tolerance, balance affecting safe and optimal ADL completion. Pt will benefit from acute OT to address functional deficits and to facilitate optimal safe and effective ADL/functional mobility.       If plan is discharge home, recommend the following: A little help with walking and/or transfers;A little help with bathing/dressing/bathroom;Assistance with cooking/housework;Assist for transportation;Help with stairs or ramp for entrance    Functional Status Assessment  Patient has had a recent decline in their functional status and demonstrates the ability to make significant improvements in function in a  reasonable and predictable amount of time.  Equipment Recommendations  BSC/3in1;Other (comment)    Recommendations for Other Services       Precautions / Restrictions Precautions Precautions: Fall Restrictions Weight Bearing Restrictions: No      Mobility Bed Mobility Overal bed mobility: Needs Assistance Bed Mobility: Supine to Sit     Supine to sit: Supervision, HOB elevated          Transfers Overall transfer level: Needs assistance Equipment used: None Transfers: Sit to/from Stand Sit to Stand: Contact guard assist                  Balance Overall balance assessment: Needs assistance Sitting-balance support: Feet supported Sitting balance-Leahy Scale: Good     Standing balance support: No upper extremity supported Standing balance-Leahy Scale: Fair                             ADL either performed or assessed with clinical judgement   ADL Overall ADL's : Needs assistance/impaired Eating/Feeding: Set up;Sitting   Grooming: Set up;Sitting               Lower Body Dressing: Maximal assistance;Sit to/from stand Lower Body Dressing Details (indicate cue type and reason): doff/donn underwear Toilet Transfer: Contact guard assist Toilet Transfer Details (indicate cue type and reason): short ambulatory transfer, simulated to bedside chair Toileting- Clothing Manipulation and Hygiene: Sit to/from stand;Moderate assistance Toileting - Clothing Manipulation Details (indicate cue type and reason): with urinal     Functional mobility during ADLs: Contact guard assist;Cueing for safety (approx 4' in room no AD)       Vision Patient Visual Report: No change  from baseline       Perception         Praxis         Pertinent Vitals/Pain Pain Assessment Pain Assessment: No/denies pain     Extremity/Trunk Assessment Upper Extremity Assessment Upper Extremity Assessment: Generalized weakness   Lower Extremity Assessment Lower  Extremity Assessment: Generalized weakness   Cervical / Trunk Assessment Cervical / Trunk Assessment: Kyphotic   Communication Communication Communication: Difficulty following commands/understanding Following commands: Follows one step commands with increased time Cueing Techniques: Verbal cues;Tactile cues   Cognition Arousal: Alert Behavior During Therapy: WFL for tasks assessed/performed Overall Cognitive Status: No family/caregiver present to determine baseline cognitive functioning Area of Impairment: Problem solving, Safety/judgement, Awareness                         Safety/Judgement: Decreased awareness of deficits Awareness: Emergent Problem Solving: Slow processing       General Comments  vss throughout    Exercises Other Exercises Other Exercises: edu re: role of OT, role of rehab, discharge recommendations   Shoulder Instructions      Home Living Family/patient expects to be discharged to:: Private residence Living Arrangements: Spouse/significant other Available Help at Discharge: Family;Available 24 hours/day Type of Home:  (camper) Home Access: Stairs to enter Entrance Stairs-Number of Steps: 3 Entrance Stairs-Rails: Right;Left;Can reach both Home Layout: One level     Bathroom Shower/Tub: Producer, television/film/video: Standard Bathroom Accessibility: Yes   Home Equipment: Cane - single point;Shower seat   Additional Comments: wears depends baseline incontinent      Prior Functioning/Environment Prior Level of Function : Needs assist       Physical Assist : ADLs (physical)   ADLs (physical): Dressing;IADLs Mobility Comments: prn use of spc ADLs Comments: wife helps with IADLs, will assist with LB dressing as needed        OT Problem List: Impaired balance (sitting and/or standing);Decreased strength;Decreased activity tolerance;Decreased knowledge of use of DME or AE      OT Treatment/Interventions: Self-care/ADL  training;Patient/family education;Therapeutic exercise;Balance training;Energy conservation;Therapeutic activities;DME and/or AE instruction    OT Goals(Current goals can be found in the care plan section) Acute Rehab OT Goals Patient Stated Goal: improve strength OT Goal Formulation: With patient Time For Goal Achievement: 11/15/22 Potential to Achieve Goals: Good ADL Goals Pt Will Perform Grooming: with modified independence;standing Pt Will Perform Lower Body Dressing: with modified independence;sit to/from stand Pt Will Transfer to Toilet: with modified independence;ambulating Pt Will Perform Toileting - Clothing Manipulation and hygiene: with modified independence;sit to/from stand  OT Frequency: Min 1X/week    Co-evaluation              AM-PAC OT "6 Clicks" Daily Activity     Outcome Measure Help from another person eating meals?: None Help from another person taking care of personal grooming?: None Help from another person toileting, which includes using toliet, bedpan, or urinal?: A Lot Help from another person bathing (including washing, rinsing, drying)?: A Lot Help from another person to put on and taking off regular upper body clothing?: A Little Help from another person to put on and taking off regular lower body clothing?: A Lot 6 Click Score: 17   End of Session Nurse Communication: Mobility status (bed wed, bed stripped)  Activity Tolerance: Patient tolerated treatment well Patient left: in chair;with call bell/phone within reach;with chair alarm set  OT Visit Diagnosis: Other abnormalities of gait and mobility (R26.89);Muscle weakness (  generalized) (M62.81)                Time: 4098-1191 OT Time Calculation (min): 25 min Charges:  OT General Charges $OT Visit: 1 Visit OT Evaluation $OT Eval Moderate Complexity: 1 Mod  Oleta Mouse, OTD OTR/L  11/01/22, 4:16 PM

## 2022-11-02 ENCOUNTER — Other Ambulatory Visit (HOSPITAL_COMMUNITY): Payer: Self-pay

## 2022-11-02 ENCOUNTER — Inpatient Hospital Stay (HOSPITAL_COMMUNITY)
Admit: 2022-11-02 | Discharge: 2022-11-02 | Disposition: A | Discharge status: Home / Self Care | Payer: Medicare Other | Attending: Internal Medicine | Admitting: Internal Medicine

## 2022-11-02 DIAGNOSIS — A310 Pulmonary mycobacterial infection: Secondary | ICD-10-CM | POA: Diagnosis not present

## 2022-11-02 DIAGNOSIS — I2609 Other pulmonary embolism with acute cor pulmonale: Secondary | ICD-10-CM

## 2022-11-02 DIAGNOSIS — J189 Pneumonia, unspecified organism: Secondary | ICD-10-CM | POA: Diagnosis not present

## 2022-11-02 DIAGNOSIS — A419 Sepsis, unspecified organism: Secondary | ICD-10-CM | POA: Diagnosis not present

## 2022-11-02 DIAGNOSIS — J470 Bronchiectasis with acute lower respiratory infection: Secondary | ICD-10-CM | POA: Diagnosis not present

## 2022-11-02 LAB — CBC
HCT: 27.3 % — ABNORMAL LOW (ref 39.0–52.0)
Hemoglobin: 8.8 g/dL — ABNORMAL LOW (ref 13.0–17.0)
MCH: 26.9 pg (ref 26.0–34.0)
MCHC: 32.2 g/dL (ref 30.0–36.0)
MCV: 83.5 fL (ref 80.0–100.0)
Platelets: 441 10*3/uL — ABNORMAL HIGH (ref 150–400)
RBC: 3.27 MIL/uL — ABNORMAL LOW (ref 4.22–5.81)
RDW: 15.2 % (ref 11.5–15.5)
WBC: 10.5 10*3/uL (ref 4.0–10.5)
nRBC: 0 % (ref 0.0–0.2)

## 2022-11-02 LAB — HEPARIN LEVEL (UNFRACTIONATED): Heparin Unfractionated: 0.16 [IU]/mL — ABNORMAL LOW (ref 0.30–0.70)

## 2022-11-02 LAB — ECHOCARDIOGRAM COMPLETE
AR max vel: 2.68 cm2
AV Area VTI: 2.63 cm2
AV Area mean vel: 2.37 cm2
AV Mean grad: 3.5 mmHg
AV Peak grad: 5.5 mmHg
Ao pk vel: 1.17 m/s
Area-P 1/2: 11.16 cm2
Calc EF: 26.9 %
Height: 70 in
S' Lateral: 4 cm
Single Plane A2C EF: 26.8 %
Single Plane A4C EF: 30.7 %
Weight: 2084.67 [oz_av]

## 2022-11-02 LAB — BASIC METABOLIC PANEL
Anion gap: 7 (ref 5–15)
BUN: 18 mg/dL (ref 8–23)
CO2: 24 mmol/L (ref 22–32)
Calcium: 8.9 mg/dL (ref 8.9–10.3)
Chloride: 108 mmol/L (ref 98–111)
Creatinine, Ser: 0.81 mg/dL (ref 0.61–1.24)
GFR, Estimated: >60 mL/min (ref >60)
Glucose, Bld: 98 mg/dL (ref 70–99)
Potassium: 3.8 mmol/L (ref 3.5–5.1)
Sodium: 139 mmol/L (ref 135–145)

## 2022-11-02 MED ORDER — IPRATROPIUM-ALBUTEROL 0.5-2.5 (3) MG/3ML IN SOLN
3.0000 mL | Freq: Two times a day (BID) | RESPIRATORY_TRACT | Status: DC
  Administered 2022-11-02 – 2022-11-03 (×2): 3 mL via RESPIRATORY_TRACT
  Filled 2022-11-02 (×2): qty 3

## 2022-11-02 MED ORDER — HEPARIN BOLUS VIA INFUSION
1750.0000 [IU] | Freq: Once | INTRAVENOUS | Status: DC
  Filled 2022-11-02: qty 1750

## 2022-11-02 MED ORDER — ACETYLCYSTEINE 20 % IN SOLN
3.0000 mL | Freq: Two times a day (BID) | RESPIRATORY_TRACT | Status: DC
  Administered 2022-11-02 – 2022-11-03 (×2): 3 mL via RESPIRATORY_TRACT
  Filled 2022-11-02 (×3): qty 4

## 2022-11-02 MED ORDER — HEPARIN BOLUS VIA INFUSION
3000.0000 [IU] | Freq: Once | INTRAVENOUS | Status: AC
  Administered 2022-11-02: 3000 [IU] via INTRAVENOUS
  Filled 2022-11-02: qty 3000

## 2022-11-02 MED ORDER — APIXABAN 5 MG PO TABS: 5.0000 mg | ORAL_TABLET | Freq: Two times a day (BID) | ORAL | Status: DC

## 2022-11-02 MED ORDER — METHYLPREDNISOLONE SODIUM SUCC 40 MG IJ SOLR
40.0000 mg | INTRAMUSCULAR | Status: DC
  Administered 2022-11-02: 40 mg via INTRAVENOUS
  Filled 2022-11-02: qty 1

## 2022-11-02 MED ORDER — ENSURE ENLIVE PO LIQD
237.0000 mL | Freq: Three times a day (TID) | ORAL | Status: DC
  Administered 2022-11-03 (×2): 237 mL via ORAL

## 2022-11-02 MED ORDER — APIXABAN 5 MG PO TABS
10.0000 mg | ORAL_TABLET | Freq: Two times a day (BID) | ORAL | Status: DC
  Administered 2022-11-02 – 2022-11-03 (×2): 10 mg via ORAL
  Filled 2022-11-02 (×2): qty 2

## 2022-11-02 MED ORDER — ADULT MULTIVITAMIN W/MINERALS CH
1.0000 | ORAL_TABLET | Freq: Every day | ORAL | Status: DC
  Administered 2022-11-02 – 2022-11-03 (×2): 1 via ORAL
  Filled 2022-11-02 (×2): qty 1

## 2022-11-02 NOTE — Plan of Care (Signed)

## 2022-11-02 NOTE — Progress Notes (Signed)
PROGRESS NOTE    Danieljames Halfpenny Schuld   ZOX:[id number] DOB: [birth year]  DOA: 10/30/2022 Date of Service: 11/02/22 PCP: Kandyce Rud, MD     Brief Narrative / Hospital Course:  Brailen Braxton Freid is a 82 y.o. 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 He was sent from ID office for worsening of leukocytosis, cough and weight loss. 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  Starting about 6 months ago, patient started to develop a cough, intermittently he will cough up some scanty phlegm and since then has had worsening of exertional dyspnea and lost of appetite and collectively he lost about 60 pounds.  2 weeks ago patient underwent a urology procedure, subsequently stopped taking almost all of his medications "feeling tired to take 100 pills a day" including the 3 antibiotics.   Yesterday, patient went to see  ID. WBC> 20,000 and was sent him to ED for further evaluation.  08/23: in ED, CTA incidental finding of bilateral PE. CT chest showed worsening and new cavity lesions likely recurrent MAI vs malignancy.  Pulmonary consulted, holding plavix in case needs bronch.  Palliative care consulted. Echo pending. DVT US pending. Neg COVID/Flu. Started on vanc + cefepime, heparin gtt.  08/24: Palliative following. Lactate, WBC, Plt improved. Hgb drop, transfusing 1 unit PRBC. D/w patient re: advanced care planning. DNR status, updated in chart. DVT neg. Echo pending. Per pulmonary, CT more c/w infection more so than cancer.  08/25: WBC trending down, Hgb improved. BCx NGx2d. Sputum Cx pending - never collected.  08/26: Echocardiogram: LVEF 30-35%, (+)RWMA, no evidence for R heart strain. Will d/c heparin gtt. Start eliquis. Continue IV abx, await pulm recs since sputum  cx never sent.   Consultants:  Palliative care Pulmonology   Procedures: none      ASSESSMENT & PLAN:   Principal Problem:   PNA (pneumonia) Active Problems:   Sepsis (HCC)   Bronchiectasis (HCC)   MAI (mycobacterium avium-intracellulare) (HCC)   Sepsis d/t multifocal pneumonia Multifocal pneumonia Question of recurrent MAI pneumonia Cefepime + Zosyn per pulmonary Sputum culture  Pulmonary following here  Per patient's ID physician (admitting hospitalist spoke to pt's ID team see H&P), hold off anti-MAI treatment for now (hold home ethambutol, azithromycin, rifampin), patient has been followed since last year and the most recent sputum culture was negative for MAI sputum culture pending AFB culture pending Consider ID consult    Incidental bilateral subsegmental PE, unprovoked SIRS d/t PE vs sepsis d/t pneumonia but likely both - resolved Likely d/t ILD/Sjogren's DVT study negative Echocardiogram: LVEF 30-35%, (+)RWMA, no evidence for R heart strain  heparin drip d/c  Chronic HFrEF New diagnosis on echo w/ LVEF 30-35%, (+)RWMA Does not appear to be in acute exacerbation, likely new dx of chronic problem Will d/w patient, if he is amenable to aggressive w/u will consult cardiology tomorrow vs f/u outpatient  Acute on chronic hypoxemic respiratory failure d/t 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  No history of GI bleed  FOBT w/ next BM Consider  outpatient follow-up with GI for colonoscopy   BPH Recent HoLEP study, and pathology showed benign tissue. Admitting hospitalist d/w urology Dr. Apolinar Junes, who is okay with anticoagulation and resume Plavix (holding plavix for now in case needing bronchoscopy)  Finasteride, tamsulosin scheduled Oxybutinin prn    CAD Denied any chest pains hold off Plavix for possible incoming bronchoscopy aspirin for now Holding beta blocker d/t borderline BP Holding statin    Severe protein calorie malnutrition Likely secondary to consuming conditions of chronic infection, management as above As per recommendation from ID physician, will consult palliative care No history of seizure or intracranial disease, will start trial of Marinol  Regular diet  Start Ensure   Anxiety/depression Continue Remeron  Advanced care planning Detailed discussion 10/31/22 see hospitalist progress note Pt has capacity to understand his situation and give informed consent/refusal   DVT prophylaxis: on heparin gtt  Pertinent IV fluids/nutrition: regular diet, d/c continuous IV fluids  Central lines / invasive devices: none  Code Status: DNR ACP documentation reviewed: none on file   Current Admission Status: inpatient  TOC needs / Dispo plan: TBD Barriers to discharge / significant pending items: clinical improvement +.- culture results, anticipate will be here another 3-4 days at least.              Subjective / Brief ROS:  Patient reports breathing is ok today Up w/ PT Not needing O2 Cough is bothersome but not intolerable  Denies SOB.  Pain controlled.  Denies new weakness.    Family Communication: spoke w/ wife at bedside on rounds     Objective Findings:  Vitals:   11/01/22 1942 11/01/22 2010 11/02/22 0731 11/02/22 0829  BP:  116/71  128/77  Pulse:  85  82  Resp:    (!) 1  Temp:  98.1 F (36.7 C)  98.5 F (36.9 C)  TempSrc:  Oral  Oral  SpO2: 94% 96% 95% 97%  Weight:       Height:        Intake/Output Summary (Last 24 hours) at 11/02/2022 1556 Last data filed at 11/02/2022 1446 Gross per 24 hour  Intake 1195.3 ml  Output 450 ml  Net 745.3 ml   Filed Weights   10/30/22 1141  Weight: 59.1 kg    Examination:  Physical Exam Constitutional:      General: He is not in acute distress.    Appearance: He is underweight. He is not toxic-appearing.  Cardiovascular:     Rate and Rhythm: Normal rate and regular rhythm.  Pulmonary:     Effort: Pulmonary effort is normal. No respiratory distress.     Comments: Coarse sounds bilaterally but no severe wheezing/rhonchi  Abdominal:     Palpations: Abdomen is soft.  Musculoskeletal:     Right lower leg: No edema.     Left lower leg: No edema.  Neurological:     General: No focal deficit present.     Mental Status: He is alert and oriented to person, place, and time. Mental status is at baseline.  Psychiatric:        Mood and Affect: Mood normal.        Behavior: Behavior normal.        Thought Content: Thought content normal.        Judgment: Judgment normal.  Scheduled Medications:   acetylcysteine  3 mL Nebulization BID   aspirin EC  81 mg Oral Daily   dronabinol  2.5 mg Oral BID AC   feeding supplement  237 mL Oral BID BM   finasteride  5 mg Oral BH-q7a   guaiFENesin  600 mg Oral BID   ipratropium-albuterol  3 mL Nebulization BID   mirtazapine  15 mg Oral QHS   mycophenolate  500 mg Oral BH-q7a   tamsulosin  0.4 mg Oral QPC breakfast    Continuous Infusions:  ceFEPime (MAXIPIME) IV 2 g (11/02/22 0544)    PRN Medications:  albuterol, HYDROcodone-acetaminophen, oxybutynin  Antimicrobials from admission:  Anti-infectives (From admission, onward)    Start     Dose/Rate Route Frequency Ordered Stop   10/31/22 1400  ceFEPIme (MAXIPIME) 2 g in sodium chloride 0.9 % 100 mL IVPB        2 g 200 mL/hr over 30 Minutes Intravenous Every 8 hours 10/31/22 1252     10/30/22 2200   piperacillin-tazobactam (ZOSYN) IVPB 3.375 g  Status:  Discontinued        3.375 g 12.5 mL/hr over 240 Minutes Intravenous Every 8 hours 10/30/22 1630 10/31/22 1234   10/30/22 1245  vancomycin (VANCOREADY) IVPB 1250 mg/250 mL        1,250 mg 166.7 mL/hr over 90 Minutes Intravenous  Once 10/30/22 1239 10/30/22 1554   10/30/22 1245  ceFEPIme (MAXIPIME) 2 g in sodium chloride 0.9 % 100 mL IVPB        2 g 200 mL/hr over 30 Minutes Intravenous  Once 10/30/22 1239 10/30/22 1355           Data Reviewed:  I have personally reviewed the following...  CBC: Recent Labs  Lab 10/30/22 1208 10/31/22 0456 10/31/22 1242 10/31/22 2055 11/01/22 0635 11/02/22 0655  WBC 18.8* 13.3*  --   --  12.1* 10.5  NEUTROABS 16.7*  --   --   --   --   --   HGB 9.1* 7.2* 6.6* 7.7* 8.5* 8.8*  HCT 30.2* 23.1* 21.2* 24.2* 26.5* 27.3*  MCV 86.3 83.7  --   --  83.1 83.5  PLT 518* 449*  --   --  416* 441*   Basic Metabolic Panel: Recent Labs  Lab 10/30/22 1208 10/31/22 0456 11/02/22 0655  NA 137 139 139  K 3.9 3.5 3.8  CL 104 108 108  CO2 23 24 24   GLUCOSE 143* 99 98  BUN 28* 22 18  CREATININE 1.03 0.82 0.81  CALCIUM 9.4 8.5* 8.9  MG 2.2  --   --    GFR: Estimated Creatinine Clearance: 60.8 mL/min (by C-G formula based on SCr of 0.81 mg/dL). Liver Function Tests: Recent Labs  Lab 10/30/22 1208  AST 23  ALT 11  ALKPHOS 67  BILITOT 0.3  PROT 7.3  ALBUMIN 2.7*   No results for input(s): "LIPASE", "AMYLASE" in the last 168 hours. No results for input(s): "AMMONIA" in the last 168 hours. Coagulation Profile: Recent Labs  Lab 10/30/22 1208  INR 1.2   Cardiac Enzymes: No results for input(s): "CKTOTAL", "CKMB", "CKMBINDEX", "TROPONINI" in the last 168 hours. BNP (last 3 results) No results for input(s): "PROBNP" in the last 8760 hours. HbA1C: No results for input(s): "HGBA1C" in the last 72 hours. CBG: No results for input(s): "GLUCAP" in the last 168 hours. Lipid Profile: No  results for input(s): "CHOL", "HDL", "LDLCALC", "TRIG", "CHOLHDL", "LDLDIRECT" in the last 72 hours. Thyroid Function Tests:  No results for input(s): "TSH", "T4TOTAL", "FREET4", "T3FREE", "THYROIDAB" in the last 72 hours.  Anemia Panel: Recent Labs    10/30/22 1630  TIBC 134*  IRON 15*   Most Recent Urinalysis On File:     Component Value Date/Time   COLORURINE YELLOW (A) 02/11/2022 0258   APPEARANCEUR Cloudy (A) 10/01/2022 1414   LABSPEC 1.028 02/11/2022 0258   LABSPEC 1.006 12/16/2012 0322   PHURINE 5.0 02/11/2022 0258   GLUCOSEU Negative 10/01/2022 1414   GLUCOSEU Negative 12/16/2012 0322   HGBUR NEGATIVE 02/11/2022 0258   BILIRUBINUR Negative 10/01/2022 1414   BILIRUBINUR Negative 12/16/2012 0322   KETONESUR 20 (A) 02/11/2022 0258   PROTEINUR 3+ (A) 10/01/2022 1414   PROTEINUR 30 (A) 02/11/2022 0258   UROBILINOGEN 0.2 03/15/2008 1651   NITRITE Positive (A) 10/01/2022 1414   NITRITE NEGATIVE 02/11/2022 0258   LEUKOCYTESUR 2+ (A) 10/01/2022 1414   LEUKOCYTESUR NEGATIVE 02/11/2022 0258   LEUKOCYTESUR Trace 12/16/2012 0322   Sepsis Labs: @LABRCNTIP (procalcitonin:4,lacticidven:4) Microbiology: Recent Results (from the past 240 hour(s))  Resp panel by RT-PCR (RSV, Flu A&B, Covid) Anterior Nasal Swab     Status: None   Collection Time: 10/30/22 12:08 PM   Specimen: Anterior Nasal Swab  Result Value Ref Range Status   SARS Coronavirus 2 by RT PCR NEGATIVE NEGATIVE Final    Comment: (NOTE) SARS-CoV-2 target nucleic acids are NOT DETECTED.  The SARS-CoV-2 RNA is generally detectable in upper respiratory specimens during the acute phase of infection. The lowest concentration of SARS-CoV-2 viral copies this assay can detect is 138 copies/mL. A negative result does not preclude SARS-Cov-2 infection and should not be used as the sole basis for treatment or other patient management decisions. A negative result may occur with  improper specimen collection/handling, submission  of specimen other than nasopharyngeal swab, presence of viral mutation(s) within the areas targeted by this assay, and inadequate number of viral copies(<138 copies/mL). A negative result must be combined with clinical observations, patient history, and epidemiological information. The expected result is Negative.  Fact Sheet for Patients:  BloggerCourse.com  Fact Sheet for Healthcare Providers:  SeriousBroker.it  This test is no t yet approved or cleared by the [place] FDA and  has been authorized for detection and/or diagnosis of SARS-CoV-2 by FDA under an Emergency Use Authorization (EUA). This EUA will remain  in effect (meaning this test can be used) for the duration of the COVID-19 declaration under Section 564(b)(1) of the Act, 21 U.S.C.section 360bbb-3(b)(1), unless the authorization is terminated  or revoked sooner.       Influenza A by PCR NEGATIVE NEGATIVE Final   Influenza B by PCR NEGATIVE NEGATIVE Final    Comment: (NOTE) The Xpert Xpress SARS-CoV-2/FLU/RSV plus assay is intended as an aid in the diagnosis of influenza from Nasopharyngeal swab specimens and should not be used as a sole basis for treatment. Nasal washings and aspirates are unacceptable for Xpert Xpress SARS-CoV-2/FLU/RSV testing.  Fact Sheet for Patients: BloggerCourse.com  Fact Sheet for Healthcare Providers: SeriousBroker.it  This test is not yet approved or cleared by the [place] FDA and has been authorized for detection and/or diagnosis of SARS-CoV-2 by FDA under an Emergency Use Authorization (EUA). This EUA will remain in effect (meaning this test can be used) for the duration of the COVID-19 declaration under Section 564(b)(1) of the Act, 21 U.S.C. section 360bbb-3(b)(1), unless the authorization is terminated or revoked.     Resp Syncytial Virus by PCR NEGATIVE NEGATIVE  Final  Comment: (NOTE) Fact Sheet for Patients: BloggerCourse.com  Fact Sheet for Healthcare Providers: SeriousBroker.it  This test is not yet approved or cleared by the [place] FDA and has been authorized for detection and/or diagnosis of SARS-CoV-2 by FDA under an Emergency Use Authorization (EUA). This EUA will remain in effect (meaning this test can be used) for the duration of the COVID-19 declaration under Section 564(b)(1) of the Act, 21 U.S.C. section 360bbb-3(b)(1), unless the authorization is terminated or revoked.  Performed at [facility], [address] [place], [place] [address]   Blood Culture (routine x 2)     Status: None (Preliminary result)   Collection Time: 10/30/22 12:08 PM   Specimen: BLOOD  Result Value Ref Range Status   Specimen Description BLOOD BLOOD LEFT ARM  Final   Special Requests   Final    BOTTLES DRAWN AEROBIC AND ANAEROBIC Blood Culture adequate volume   Culture   Final    NO GROWTH 3 DAYS Performed at [facility], [address] [place], [place] [address]    Report Status PENDING  Incomplete  Blood Culture (routine x 2)     Status: None (Preliminary result)   Collection Time: 10/30/22 12:08 PM   Specimen: BLOOD  Result Value Ref Range Status   Specimen Description BLOOD BLOOD RIGHT ARM  Final   Special Requests   Final    BOTTLES DRAWN AEROBIC AND ANAEROBIC Blood Culture adequate volume   Culture   Final    NO GROWTH 3 DAYS Performed at [facility], [address] [place], [place] [address]    Report Status PENDING  Incomplete      Radiology Studies last 3 days: ECHOCARDIOGRAM COMPLETE  Result Date: 11/02/2022    ECHOCARDIOGRAM REPORT   Patient Name:   SAMULE UGALDE [facility] Date of Exam: 11/02/2022 Medical Rec #:  [id number]      Height:       70.0 in Accession #:    [id number]     Weight:       130.3 lb Date of Birth:  September 08, 1940      BSA:           1.740 m Patient Age:    82 years       BP:           128/77 mmHg Patient Gender: M              HR:           82 bpm. Exam Location:  [facility] Procedure: Cardiac Doppler, Color Doppler and Strain Analysis Indications:     Pulmonary Embolus I26.09  History:         Patient has no prior history of Echocardiogram examinations.                  CAD and Previous Myocardial Infarction; Risk                  Factors:Hypertension. S/P CABG x5.  Sonographer:     Cristela Blue Referring Phys:  [id number] Emeline General Diagnosing Phys: Lorine Bears MD  Sonographer Comments: Global longitudinal strain was attempted. IMPRESSIONS  1. Left ventricular ejection fraction, by estimation, is 30 to 35%. The left ventricle has moderately decreased function. The left ventricle demonstrates regional wall motion abnormalities (see scoring diagram/findings for description). There is moderate asymmetric left ventricular hypertrophy of the basal-septal segment. Left ventricular diastolic parameters are indeterminate. There is akinesis of the left ventricular, entire  anteroseptal wall, anterior wall and apical segment. The average left  ventricular global longitudinal strain is -9.1 %. The global longitudinal strain is abnormal.  2. Right ventricular systolic function is normal. The right ventricular size is normal. Tricuspid regurgitation signal is inadequate for assessing PA pressure.  3. The mitral valve is normal in structure. Mild mitral valve regurgitation. No evidence of mitral stenosis.  4. The aortic valve is normal in structure. Aortic valve regurgitation is not visualized. Aortic valve sclerosis is present, with no evidence of aortic valve stenosis. FINDINGS  Left Ventricle: Left ventricular ejection fraction, by estimation, is 30 to 35%. The left ventricle has moderately decreased function. The left ventricle demonstrates regional wall motion abnormalities. The average left ventricular global longitudinal strain is -9.1 %. The global  longitudinal strain is abnormal. The left ventricular internal cavity size was normal in size. There is moderate asymmetric left ventricular hypertrophy of the basal-septal segment. Left ventricular diastolic parameters are indeterminate. Right Ventricle: The right ventricular size is normal. No increase in right ventricular wall thickness. Right ventricular systolic function is normal. Tricuspid regurgitation signal is inadequate for assessing PA pressure. Left Atrium: Left atrial size was normal in size. Right Atrium: Right atrial size was normal in size. Pericardium: There is no evidence of pericardial effusion. Mitral Valve: The mitral valve is normal in structure. Mild mitral valve regurgitation. No evidence of mitral valve stenosis. Tricuspid Valve: The tricuspid valve is normal in structure. Tricuspid valve regurgitation is not demonstrated. No evidence of tricuspid stenosis. Aortic Valve: The aortic valve is normal in structure. Aortic valve regurgitation is not visualized. Aortic valve sclerosis is present, with no evidence of aortic valve stenosis. Aortic valve mean gradient measures 3.5 mmHg. Aortic valve peak gradient measures 5.5 mmHg. Aortic valve area, by VTI measures 2.63 cm. Pulmonic Valve: The pulmonic valve was normal in structure. Pulmonic valve regurgitation is not visualized. No evidence of pulmonic stenosis. Aorta: The aortic root is normal in size and structure. Venous: The inferior vena cava was not well visualized. IAS/Shunts: No atrial level shunt detected by color flow Doppler.  LEFT VENTRICLE PLAX 2D LVIDd:         5.30 cm      Diastology LVIDs:         4.00 cm      LV e' medial:    5.98 cm/s LV PW:         0.70 cm      LV E/e' medial:  9.3 LV IVS:        1.40 cm      LV e' lateral:   8.59 cm/s LVOT diam:     2.00 cm      LV E/e' lateral: 6.5 LV SV:         45 LV SV Index:   26           2D Longitudinal Strain LVOT Area:     3.14 cm     2D Strain GLS Avg:     -9.1 %  LV Volumes (MOD)  LV vol d, MOD A2C: 138.0 ml LV vol d, MOD A4C: 107.0 ml LV vol s, MOD A2C: 101.0 ml LV vol s, MOD A4C: 74.2 ml LV SV MOD A2C:     37.0 ml LV SV MOD A4C:     107.0 ml LV SV MOD BP:      33.6 ml RIGHT VENTRICLE RV Basal diam:  4.50 cm RV Mid diam:    3.80 cm LEFT ATRIUM  Index        RIGHT ATRIUM           Index LA diam:      4.00 cm 2.30 cm/m   RA Area:     12.60 cm LA Vol (A2C): 48.3 ml 27.76 ml/m  RA Volume:   23.00 ml  13.22 ml/m LA Vol (A4C): 11.2 ml 6.44 ml/m  AORTIC VALVE AV Area (Vmax):    2.68 cm AV Area (Vmean):   2.37 cm AV Area (VTI):     2.63 cm AV Vmax:           117.00 cm/s AV Vmean:          85.650 cm/s AV VTI:            0.171 m AV Peak Grad:      5.5 mmHg AV Mean Grad:      3.5 mmHg LVOT Vmax:         99.80 cm/s LVOT Vmean:        64.600 cm/s LVOT VTI:          0.143 m LVOT/AV VTI ratio: 0.84  AORTA Ao Root diam: 3.10 cm MITRAL VALVE                TRICUSPID VALVE MV Area (PHT): 11.16 cm    TR Peak grad:   10.8 mmHg MV Decel Time: 68 msec      TR Vmax:        164.00 cm/s MV E velocity: 55.50 cm/s MV A velocity: 110.00 cm/s  SHUNTS MV E/A ratio:  0.50         Systemic VTI:  0.14 m                             Systemic Diam: 2.00 cm Lorine Bears MD Electronically signed by Lorine Bears MD Signature Date/Time: 11/02/2022/11:01:39 AM    Final    US Venous Img Lower Bilateral (DVT)  Result Date: 10/30/2022 CLINICAL DATA:  Pulmonary embolism. Evaluate for lower extremity DVT. EXAM: BILATERAL LOWER EXTREMITY VENOUS DOPPLER ULTRASOUND TECHNIQUE: Gray-scale sonography with graded compression, as well as color Doppler and duplex ultrasound were performed to evaluate the lower extremity deep venous systems from the level of the common femoral vein and including the common femoral, femoral, profunda femoral, popliteal and calf veins including the posterior tibial, peroneal and gastrocnemius veins when visible. The superficial great saphenous vein was also interrogated. Spectral Doppler  was utilized to evaluate flow at rest and with distal augmentation maneuvers in the common femoral, femoral and popliteal veins. COMPARISON:  None Available. FINDINGS: RIGHT LOWER EXTREMITY Common Femoral Vein: No evidence of thrombus. Normal compressibility, respiratory phasicity and response to augmentation. Saphenofemoral Junction: No evidence of thrombus. Normal compressibility and flow on color Doppler imaging. Profunda Femoral Vein: No evidence of thrombus. Normal compressibility and flow on color Doppler imaging. Femoral Vein: No evidence of thrombus. Normal compressibility, respiratory phasicity and response to augmentation. Popliteal Vein: No evidence of thrombus. Normal compressibility, respiratory phasicity and response to augmentation. Calf Veins: No evidence of thrombus. Normal compressibility and flow on color Doppler imaging. Superficial Great Saphenous Vein: No evidence of thrombus. Normal compressibility. Other Findings:  None. LEFT LOWER EXTREMITY Common Femoral Vein: No evidence of thrombus. Normal compressibility, respiratory phasicity and response to augmentation. Saphenofemoral Junction: No evidence of thrombus. Normal compressibility and flow on color Doppler imaging. Profunda Femoral Vein: No evidence of thrombus. Normal compressibility  and flow on color Doppler imaging. Femoral Vein: No evidence of thrombus. Normal compressibility, respiratory phasicity and response to augmentation. Popliteal Vein: No evidence of thrombus. Normal compressibility, respiratory phasicity and response to augmentation. Calf Veins: No evidence of thrombus. Normal compressibility and flow on color Doppler imaging. Superficial Great Saphenous Vein: No evidence of thrombus. Normal compressibility. Other Findings:  None. IMPRESSION: No evidence of DVT within either lower extremity. Electronically Signed   By: Simonne Come M.D.   On: 10/30/2022 17:27   CT Angio Chest PE W and/or Wo Contrast  Result Date:  10/30/2022 CLINICAL DATA:  Cachectic with history of 60 pound weight loss EXAM: CT ANGIOGRAPHY CHEST CT ABDOMEN AND PELVIS WITH CONTRAST TECHNIQUE: Multidetector CT imaging of the chest was performed using the standard protocol during bolus administration of intravenous contrast. Multiplanar CT image reconstructions and MIPs were obtained to evaluate the vascular anatomy. Multidetector CT imaging of the abdomen and pelvis was performed using the standard protocol during bolus administration of intravenous contrast. RADIATION DOSE REDUCTION: This exam was performed according to the departmental dose-optimization program which includes automated exposure control, adjustment of the mA and/or kV according to patient size and/or use of iterative reconstruction technique. CONTRAST:  75mL OMNIPAQUE IOHEXOL 350 MG/ML SOLN COMPARISON:  CT chest, abdomen and pelvis dated Aug 05, 2022 FINDINGS: CTA CHEST FINDINGS: Cardiovascular: Scattered subsegmental pulmonary emboli are seen in the bilateral lower lobes. Normal heart size. No CT evidence of right heart strain (0.7 ratio). No pericardial effusion. Normal caliber thoracic aorta with severe calcified plaque. Severe coronary artery calcifications status post CABG. Dilated main pulmonary artery, measuring up to 3.4 cm. Mediastinum/Nodes: Esophagus and thyroid are unremarkable. Enlarged mediastinal and right hilar nodes, increased in size. Reference right hilar lymph node measures 11 mm short axis, previously 11 mm. Precarinal lymph node measures 14 mm, previously 10 mm. Lungs/Pleura: Central airways are patent. Diffuse bronchial wall thickening with scattered areas of mucous plugging. Numerous pulmonary nodules some with cavitation, are seen throughout the lungs, but most severe in the upper lobes. New cavitary mass of the lingula measuring 6.0 x 3.6 cm on series 5, image 89, findings are otherwise similar. No pleural effusion. Musculoskeletal: Prior median sternotomy. No acute  or significant osseous findings. Review of the MIP images confirms the above findings. CT ABDOMEN and PELVIS FINDINGS Hepatobiliary: Unchanged low-attenuation lesion of the central liver measuring 13 mm on series 2, image 18. No gallstones, gallbladder wall thickening, or biliary dilatation. Pancreas: Unremarkable. No pancreatic ductal dilatation or surrounding inflammatory changes. Spleen: Normal in size without focal abnormality. Adrenals/Urinary Tract: Bilateral adrenal glands are unremarkable. No hydronephrosis. Punctate nonobstructing stone of the left kidney. Thick-walled urinary bladder. Stomach/Bowel: Stomach is within normal limits. Appendix appears normal. No evidence of bowel wall thickening, distention, or inflammatory changes. Vascular/Lymphatic: Abdominal aortic aneurysm measuring 4.3 by 3.8 cm, unchanged when compared with the prior exam. 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 No enlarged lymph nodes seen in the chest. Reproductive: Heterogeneous and enlarged prostate, similar to prior exam Other: No abdominal wall hernia or abnormality. No abdominopelvic ascites. Musculoskeletal: No acute or significant osseous findings. Review of the MIP images confirms the above findings. IMPRESSION: Chest CTA: 1. Scattered subsegmental pulmonary emboli in the bilateral lower lobes. 2. 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 Finding may be due to acute worsening of underlying infection, although neoplasm can not be excluded. Short-term follow-up chest CT in 1-2  months is recommended to ensure resolution. 3. Enlarged mediastinal and right hilar nodes, increased in size, possibly reactive. Recommend attention on follow-up. Abdomen and pelvis CT: 1. No acute findings in the abdomen or pelvis. 2. Unchanged indeterminate low-attenuation lesion of the central liver measuring 13 mm. Could be completely  characterized with abdominal MRI, this can be non emergently. 3. Stable abdominal aortic aneurysm measuring to 4.3 cm. Recommend follow-up every 12 months and vascular consultation. This recommendation follows ACR consensus guidelines: White Paper of the ACR Incidental Findings Committee II on Vascular Findings. J Am Coll Radiol 2013; 10:789-794. 4. 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 Thick-walled urinary bladder, likely due to chronic outlet obstruction given associated prostatomegaly. 5. 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 Critical Value/emergent results were called by telephone at the time of interpretation on 10/30/2022 at 3:08 pm to provider DAVID WELLS , who verbally acknowledged these results. Electronically Signed   By: Allegra Lai M.D.   On: 10/30/2022 15:16   CT ABDOMEN PELVIS W CONTRAST  Result Date: 10/30/2022 CLINICAL DATA:  Cachectic with history of 60 pound weight loss EXAM: CT ANGIOGRAPHY CHEST CT ABDOMEN AND PELVIS WITH CONTRAST TECHNIQUE: Multidetector CT imaging of the chest was performed using the standard protocol during bolus administration of intravenous contrast. Multiplanar CT image reconstructions and MIPs were obtained to evaluate the vascular anatomy. Multidetector CT imaging of the abdomen and pelvis was performed using the standard protocol during bolus administration of intravenous contrast. RADIATION DOSE REDUCTION: This exam was performed according to the departmental dose-optimization program which includes automated exposure control, adjustment of the mA and/or kV according to patient size and/or use of iterative reconstruction technique. CONTRAST:  75mL OMNIPAQUE IOHEXOL 350 MG/ML SOLN COMPARISON:  CT chest, abdomen and pelvis dated Aug 05, 2022 FINDINGS: CTA CHEST FINDINGS: Cardiovascular: Scattered subsegmental pulmonary emboli are seen in the bilateral lower lobes. Normal heart size. No CT evidence of right heart strain (0.7 ratio). No  pericardial effusion. Normal caliber thoracic aorta with severe calcified plaque. Severe coronary artery calcifications status post CABG. Dilated main pulmonary artery, measuring up to 3.4 cm. Mediastinum/Nodes: Esophagus and thyroid are unremarkable. Enlarged mediastinal and right hilar nodes, increased in size. Reference right hilar lymph node measures 11 mm short axis, previously 11 mm. Precarinal lymph node measures 14 mm, previously 10 mm. Lungs/Pleura: Central airways are patent. Diffuse bronchial wall thickening with scattered areas of mucous plugging. Numerous pulmonary nodules some with cavitation, are seen throughout the lungs, but most severe in the upper lobes. New cavitary mass of the lingula measuring 6.0 x 3.6 cm on series 5, image 89, findings are otherwise similar. No pleural effusion. Musculoskeletal: Prior median sternotomy. No acute or significant osseous findings. Review of the MIP images confirms the above findings. CT ABDOMEN and PELVIS FINDINGS Hepatobiliary: Unchanged low-attenuation lesion of the central liver measuring 13 mm on series 2, image 18. No gallstones, gallbladder wall thickening, or biliary dilatation. Pancreas: Unremarkable. No pancreatic ductal dilatation or surrounding inflammatory changes. Spleen: Normal in size without focal abnormality. Adrenals/Urinary Tract: Bilateral adrenal glands are unremarkable. No hydronephrosis. Punctate nonobstructing stone of the left kidney. Thick-walled urinary bladder. Stomach/Bowel: Stomach is within normal limits. Appendix appears normal. No evidence of bowel wall thickening, distention, or inflammatory changes. Vascular/Lymphatic: Abdominal aortic aneurysm measuring 4.3 by 3.8 cm, unchanged when compared with the prior exam. 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 No enlarged lymph nodes seen in the chest. Reproductive: Heterogeneous and enlarged prostate, similar to prior exam  Other: No abdominal wall hernia or  abnormality. No abdominopelvic ascites. Musculoskeletal: No acute or significant osseous findings. Review of the MIP images confirms the above findings. IMPRESSION: Chest CTA: 1. Scattered subsegmental pulmonary emboli in the bilateral lower lobes. 2. 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 Finding may be due to acute worsening of underlying infection, although neoplasm can not be excluded. Short-term follow-up chest CT in 1-2 months is recommended to ensure resolution. 3. Enlarged mediastinal and right hilar nodes, increased in size, possibly reactive. Recommend attention on follow-up. Abdomen and pelvis CT: 1. No acute findings in the abdomen or pelvis. 2. Unchanged indeterminate low-attenuation lesion of the central liver measuring 13 mm. Could be completely characterized with abdominal MRI, this can be non emergently. 3. Stable abdominal aortic aneurysm measuring to 4.3 cm. Recommend follow-up every 12 months and vascular consultation. This recommendation follows ACR consensus guidelines: White Paper of the ACR Incidental Findings Committee II on Vascular Findings. J Am Coll Radiol 2013; 10:789-794. 4. 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 Thick-walled urinary bladder, likely due to chronic outlet obstruction given associated prostatomegaly. 5. 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 Critical Value/emergent results were called by telephone at the time of interpretation on 10/30/2022 at 3:08 pm to provider DAVID WELLS , who verbally acknowledged these results. Electronically Signed   By: Allegra Lai M.D.   On: 10/30/2022 15:16   DG Chest Port 1 View  Result Date: 10/30/2022 CLINICAL DATA:  Sepsis. EXAM: PORTABLE CHEST 1 VIEW COMPARISON:  X-ray 02/10/2022.  CT 08/05/2022 FINDINGS: Status post median sternotomy. Normal cardiopericardial silhouette. Overlapping cardiac leads. Hyperinflation.  Once again there is diffuse interstitial changes of the lungs with some patchy areas as well as bronchiectasis. The extent and distribution is similar to the prior x-ray in the right lung. There is slightly increasing opacity left midlung. Superimposed infiltrates possible. IMPRESSION: Hyperinflation with known chronic lung changes with interstitial changes and bronchiectasis. There is slightly more opacity in the left midlung. Please correlate for developing infiltrate and recommend follow-up Electronically Signed   By: Karen Kays M.D.   On: 10/30/2022 12:44             LOS: 3 days       Sunnie Nielsen, DO [facility] 11/02/2022, 3:56 PM    Dictation software may have been used to generate the above note. Typos may occur and escape review in typed/dictated notes. Please contact Dr Lyn Hollingshead directly for clarity if needed.  Staff may message me via secure chat in Epic  but this may not receive an immediate response,  please page me for urgent matters!  If 7PM-7AM, please contact night coverage [email or web address]

## 2022-11-02 NOTE — Progress Notes (Signed)
*  PRELIMINARY RESULTS* Echocardiogram 2D Echocardiogram has been performed.  Cristela Blue 11/02/2022, 10:40 AM

## 2022-11-02 NOTE — TOC Benefit Eligibility Note (Signed)
Patient Product/process development scientist completed.    The patient is insured through [facility]. Patient has Medicare and is not eligible for a copay card, but may be able to apply for patient assistance, if available.    Ran test claim for Eliquis Starter Pack and the current 30 day co-pay is $47.00.   This test claim was processed through [facility]- copay amounts may vary at other pharmacies due to pharmacy/plan contracts, or as the patient moves through the different stages of their insurance plan.     Roland Earl, CPHT Pharmacy Technician III Certified Patient Advocate [facility] Patient Advocate Team Direct Number: [phone]  Fax: [phone]

## 2022-11-02 NOTE — Progress Notes (Signed)
Transition of Care [facility]) - Inpatient Brief Assessment   Patient Details  Name: Nicholas Alvarez MRN: [id number] Date of Birth: 1940-12-12  Transition of Care [facility]) CM/SW Contact:    Truddie Hidden, RN Phone Number: 11/02/2022, 11:20 AM   Clinical Narrative: Patient admits from home with [facility] and hospice .    Transition of Care Asessment: Insurance and Status: Insurance coverage has been reviewed Patient has primary care physician: Yes Home environment has been reviewed: Return to home Prior level of function:: Assisted by [facility] Prior/Current Home Services: Current home services [facility]) Social Determinants of Health Reivew: SDOH reviewed no interventions necessary Readmission risk has been reviewed: Yes Transition of care needs: transition of care needs identified, TOC will continue to follow

## 2022-11-02 NOTE — Evaluation (Signed)
Physical Therapy Evaluation Patient Details Name: Nicholas Alvarez MRN: [id number] DOB: 1940/10/27 Today's Date: 11/02/2022  History of Present Illness  Pt is an 82 year old male admitted from  ID office on 10/30/2022 with worsening leukocytosis, worsening cough and ongoing weight loss. Admitted with sepsis due to multi focal pneumonia, incidental bilateral subsegmental PE. 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  Clinical Impression  Pt was pleasant and motivated to participate during the session and put forth good effort throughout. Pt found supine in bed with wife in room. Pt able to sit EOB at supervision with consistent cues for clarification on task, cues for hand placement and sequencing. Pt able to walk 6 feet to recliner with RW at [facility] and sit down. With further encouragement pt willing to walk more; ambulated additional 40 feet past door with same AD and assist. Pt will benefit from continued PT services upon discharge to safely address deficits listed in patient problem list for decreased caregiver assistance and eventual return to PLOF.          If plan is discharge home, recommend the following: A little help with walking and/or transfers;A little help with bathing/dressing/bathroom;Assistance with cooking/housework;Assist for transportation;Help with stairs or ramp for entrance   Can travel by private vehicle        Equipment Recommendations Rolling walker (2 wheels)  Recommendations for Other Services       Functional Status Assessment Patient has had a recent decline in their functional status and demonstrates the ability to make significant improvements in function in a reasonable and predictable amount of time.     Precautions / Restrictions Precautions Precautions: Fall Restrictions Weight Bearing Restrictions: No      Mobility  Bed Mobility Overal bed mobility:  Needs Assistance Bed Mobility: Supine to Sit     Supine to sit: HOB elevated, Supervision     General bed mobility comments: Cues for hand placement and sequencing, cues for task at hand.    Transfers Overall transfer level: Needs assistance Equipment used: Rolling walker (2 wheels) Transfers: Sit to/from Stand Sit to Stand: Contact guard assist           General transfer comment: cues for hand placement ans sequencing.    Ambulation/Gait Ambulation/Gait assistance: Contact guard assist Gait Distance (Feet): 40 Feet Assistive device: Rolling walker (2 wheels) Gait Pattern/deviations: Step-through pattern, Decreased step length - right, Decreased step length - left, Decreased stride length Gait velocity: decreased     General Gait Details: able to walk from recliner to just outside door and back  Stairs            Wheelchair Mobility     Tilt Bed    Modified Rankin (Stroke Patients Only)       Balance Overall balance assessment: Needs assistance Sitting-balance support: Feet supported, No upper extremity supported Sitting balance-Leahy Scale: Good     Standing balance support: During functional activity, No upper extremity supported Standing balance-Leahy Scale: Fair Standing balance comment: Static standing at RW, holding on                             Pertinent Vitals/Pain Pain Assessment Pain Assessment: Faces Faces Pain Scale: Hurts a little bit Pain Location: Tailbone/waist Pain Descriptors / Indicators: Sore Pain Intervention(s): Monitored during session, Repositioned, Limited activity within patient's tolerance    Home Living  Family/patient expects to be discharged to:: Private residence Living Arrangements: Spouse/significant other Available Help at Discharge: Family;Available 24 hours/day Type of Home: Other(Comment) (camper) Home Access: Stairs to enter Entrance Stairs-Rails: Right;Left;Can reach both Entrance  Stairs-Number of Steps: 3   Home Layout: One level Home Equipment: Cane - single point;Shower seat Additional Comments: wears depends baseline incontinent    Prior Function Prior Level of Function : Needs assist             Mobility Comments: prn use of spc, limited community ambulator. Had 3 falls, stumbling/had LoB for each  ADLs Comments: wife helps with IADLs, will assist with LB dressing as needed     Extremity/Trunk Assessment   Upper Extremity Assessment Upper Extremity Assessment: Defer to OT evaluation    Lower Extremity Assessment Lower Extremity Assessment: Generalized weakness       Communication   Communication Communication: Difficulty following commands/understanding Following commands: Follows one step commands with increased time Cueing Techniques: Verbal cues  Cognition Arousal: Alert Behavior During Therapy: WFL for tasks assessed/performed Overall Cognitive Status: Within Functional Limits for tasks assessed                                          General Comments      Exercises     Assessment/Plan    PT Assessment Patient needs continued PT services  PT Problem List Decreased strength;Decreased range of motion;Decreased activity tolerance;Decreased balance;Decreased mobility;Decreased coordination       PT Treatment Interventions DME instruction;Gait training;Stair training;Functional mobility training;Therapeutic activities;Therapeutic exercise;Balance training    PT Goals (Current goals can be found in the Care Plan section)  Acute Rehab PT Goals Patient Stated Goal: get like I was before PT Goal Formulation: With patient Time For Goal Achievement: 11/15/22 Potential to Achieve Goals: Fair    Frequency Min 1X/week     Co-evaluation               AM-PAC PT "6 Clicks" Mobility  Outcome Measure Help needed turning from your back to your side while in a flat bed without using bedrails?: A Little Help  needed moving from lying on your back to sitting on the side of a flat bed without using bedrails?: A Little Help needed moving to and from a bed to a chair (including a wheelchair)?: A Little Help needed standing up from a chair using your arms (e.g., wheelchair or bedside chair)?: A Little Help needed to walk in hospital room?: A Little Help needed climbing 3-5 steps with a railing? : A Little 6 Click Score: 18    End of Session Equipment Utilized During Treatment: Gait belt Activity Tolerance: Patient tolerated treatment well Patient left: in chair;with call bell/phone within reach;with chair alarm set;with family/visitor present Nurse Communication: Mobility status PT Visit Diagnosis: Other abnormalities of gait and mobility (R26.89);Muscle weakness (generalized) (M62.81);Unsteadiness on feet (R26.81)    Time: 3244-0102 PT Time Calculation (min) (ACUTE ONLY): 40 min   Charges:                 Cecile Sheerer, SPT 11/02/22, 1:20 PM

## 2022-11-02 NOTE — Progress Notes (Signed)
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  Pt admitted with sepsis secondary to multifocal pneumonia.   Reviewed I/O's: +505 ml x 24 hours and +5.5 L since admission  Spoke with pt, who was sitting in recliner chair at time of visit. Pt sleeping but easily woke to tough. Pt reports feeling better, but complains of feeling weak and sleepy. Pt shares that he has had decreased appetite and oral intake over the past few years due to residual side effect of COVID. He initially was unable to taste, but sense of taste has gradually improved. Per pt, he estimates he has lost about 100# in the past year due to poor oral intake. He shares that the weight loss concerns him as he feels weak and unsteady on his feet.   Observed lunch tray. Pt consumed 75% of meal. Pt's only complaint is that he does not get enough seasoning packets for his foods as he prefers his food to be highly seasoned secondary to taste changes. Pt currently on a regular diet; noted meal completions 50%. Pt consumed 100% of Ensure supplement and enjoys these; he reports he just started drinking them this admission. He tried another brand of protein  shake PTA (does not remember brand name) but did not like the flavor.   Reviewed wt hx; pt has experienced a 6.9% wt loss over the past 4 months, which is not significant for time frame.   Discussed importance of good meal and supplement intake to promote healing. Pt amenable to continue supplements.   Medications reviewed and include marinol.   Lab Results  Component Value Date   HGBA1C  03/07/2008    5.9 (NOTE)   The ADA recommends the following therapeutic goal for glycemic   control related to Hgb A1C measurement:   Goal of Therapy:   < 7.0% Hgb A1C   Reference: American Diabetes Association: Clinical Practice   Recommendations 2008, Diabetes Care,  2008, 31:(Suppl 1).   PTA DM medications are none.   Labs reviewed: CBGS: 122 (inpatient orders for glycemic control are none).    NUTRITION - FOCUSED PHYSICAL EXAM:  Flowsheet Row Most Recent Value  Orbital Region Severe depletion  Upper Arm Region Severe depletion  Thoracic and Lumbar Region Severe depletion  Buccal Region Severe depletion  Temple Region Severe depletion  Clavicle Bone Region Severe depletion  Clavicle and Acromion Bone Region Severe depletion  Scapular Bone Region Severe depletion  Dorsal Hand Severe depletion  Patellar Region Severe depletion  Anterior Thigh Region Severe depletion  Posterior Calf Region Severe depletion  Edema (RD Assessment) None  Hair Reviewed  Eyes Reviewed  Mouth Reviewed  Skin Reviewed  Nails Reviewed       Diet Order:   Diet Order  Diet regular Room service appropriate? Yes; Fluid consistency: Thin  Diet effective now                   EDUCATION NEEDS:   Education needs have been addressed  Skin:  Skin Assessment: Reviewed RN Assessment  Last BM:  Unknown  Height:   Ht Readings from Last 1 Encounters:  10/30/22 5\' 10"  (1.778 m)    Weight:   Wt Readings from Last 1 Encounters:  10/30/22 59.1 kg    Ideal Body Weight:  75.5 kg  BMI:  Body  mass index is 18.69 kg/m.  Estimated Nutritional Needs:   Kcal:  1850-2050  Protein:  105-120 grams  Fluid:  > 1.8 L    Levada Schilling, RD, LDN, CDCES Registered Dietitian II Certified Diabetes Care and Education Specialist Please refer to [facility] for RD and/or RD on-call/weekend/after hours pager

## 2022-11-02 NOTE — Progress Notes (Signed)
PULMONOLOGY         Date: 11/02/2022,   MRN# [id number] ROI HJORT December 25, 1940     Admission                  Current   Referring provider: DrAlexander   CHIEF COMPLAINT:    Multifocal pneumonia with failure of outpatient therapy.    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  Starting about 6 months ago, patient started to develop a cough, intermittently he will cough up some scanty phlegm and since then has had worsening of exertional dyspnea and lost of appetite and collectively he lost about 60 pounds.  2 weeks ago patient underwent a urology procedure, subsequently stopped taking almost all of his medications including the 3 antibiotics.    He has had copious amounts of phlegm with forceful cough that is severe in intensity and lasting day and night.  He has been progressively more weak and has also lost weight due to diminished appetite. He had CT PE done which I reviewed with findings showing bilateral cavitary lung disease which is difficult to tell from infection vs cancer vs fibronadular cavitary inflammaty lesions from ILD. He is empirically being treated for infection.  PCCM consultation for additional evaluation management of this complex respiratory patient.   11/02/22- patient s/p TTE with HFrEF, complains of bladder incontinence.  He is improved from respiratory perspective. He should start PT/OT with dc planning and outpatient follow up.   PAST MEDICAL HISTORY   Past Medical History:  Diagnosis Date   Acute hypoxemic respiratory failure (HCC)    BPH (benign prostatic hyperplasia)    Bronchiectasis (HCC)    CAD  (coronary artery disease)    Post anterior wall myocardial infarction and 5-vessel coronary bypass    Chronic anemia    COVID-19    Depression    Dyslipidemia    Dyspnea    ED (erectile dysfunction)    Elevated PSA    Falls    Gross hematuria    History of methicillin resistant staphylococcus aureus (MRSA) 02/2022   + PCR nasal swab   Hx of hypotension    was placed on midodrine and then taken off   Hypertension    Interstitial lung disease (HCC)    Ischemic cardiomyopathy    with left ventricular ejection fraction of 35%   Left ventricular aneurysm    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   Myocardial infarction (HCC) 2009   Pneumonia    Protein-calorie malnutrition, severe (HCC)    S/P CABG x 5 2010   Sepsis due to pneumonia (HCC)    Sjogren's syndrome (HCC)    lung involvment     SURGICAL HISTORY   Past Surgical History:  Procedure Laterality Date   BRONCHIAL WASHINGS N/A 01/16/2022   Procedure: BRONCHIAL WASHINGS;  Surgeon: Vida Rigger, MD;  Location: [facility] ORS;  Service: Thoracic;  Laterality: N/A;   CARDIAC CATHETERIZATION  03/2008   COLONOSCOPY     CORONARY ARTERY BYPASS GRAFT  03/12/2008   HOLEP-LASER ENUCLEATION OF THE PROSTATE WITH MORCELLATION N/A 10/12/2022   Procedure: HOLEP-LASER ENUCLEATION OF THE PROSTATE WITH MORCELLATION;  Surgeon: Vanna Scotland, MD;  Location:  [facility] OR[facility];  Service: Urology;  Laterality: N/A;   PROSTATE BIOPSY  11/2009   RIGID BRONCHOSCOPY N/A 01/16/2022   Procedure: RIGID BRONCHOSCOPY;  Surgeon: Vida Rigger, MD;  Location: [facility] OR[facility];  Service: Thoracic;  Laterality: N/A;     FAMILY HISTORY   Family History  Problem Relation Age of Onset   Coronary artery disease Other    Heart attack Sister 27   Heart attack Maternal Grandfather 40   Heart attack Paternal Grandfather 82   COPD Mother    Pneumonia Father    Cancer Sister      SOCIAL HISTORY   Social History   Tobacco Use   Smoking  status: Former    Types: Cigarettes    Passive exposure: Past   Smokeless tobacco: Never   Tobacco comments:    Quit around age 86  Vaping Use   Vaping status: Never Used  Substance Use Topics   Alcohol use: Yes    Alcohol/week: 12.0 standard drinks of alcohol    Types: 12 Shots of liquor per week    Comment: occ   Drug use: No     MEDICATIONS    Home Medication:     Current Medication: No current facility-administered medications for this encounter. No current outpatient medications on file.  Facility-Administered Medications Ordered in Other Encounters:    acetylcysteine (MUCOMYST) 20 % nebulizer / oral solution 3 mL, 3 mL, Nebulization, TID, Karna Christmas, Claryssa Sandner, MD, 3 mL at 11/02/22 0729   albuterol (PROVENTIL) (2.5 MG/3ML) 0.083% nebulizer solution 2.5 mg, 2.5 mg, Inhalation, Q6H PRN, Mikey College T, MD   aspirin EC tablet 81 mg, 81 mg, Oral, Daily, Chipper Herb, Ping T, MD, 81 mg at 11/01/22 1022   ceFEPIme (MAXIPIME) 2 g in sodium chloride 0.9 % 100 mL IVPB, 2 g, Intravenous, Q8H, Hallaji, Sheema M, RPH, Last Rate: 200 mL/hr at 11/02/22 0544, 2 g at 11/02/22 0544   dronabinol (MARINOL) capsule 2.5 mg, 2.5 mg, Oral, BID AC, Zhang, Ping T, MD, 2.5 mg at 11/01/22 1739   feeding supplement (ENSURE ENLIVE / ENSURE PLUS) liquid 237 mL, 237 mL, Oral, BID BM, Mikey College T, MD, 237 mL at 11/01/22 1743   finasteride (PROSCAR) tablet 5 mg, 5 mg, Oral, BH-q7a, Zhang, Ping T, MD, 5 mg at 11/01/22 0601   guaiFENesin (MUCINEX) 12 hr tablet 600 mg, 600 mg, Oral, BID, Chipper Herb, Ping T, MD, 600 mg at 11/01/22 2146   heparin ADULT infusion 100 units/mL (25000 units/213mL), 2,100 Units/hr, Intravenous, Continuous, Hunt, Madison H, RPH, Last Rate: 19 mL/hr at 11/02/22 0600, 1,900 Units/hr at 11/02/22 0600   heparin bolus via infusion 1,750 Units, 1,750 Units, Intravenous, Once, Hunt, Madison H, RPH   HYDROcodone-acetaminophen (NORCO/VICODIN) 5-325 MG per tablet 1-2 tablet, 1-2 tablet, Oral, Q6H PRN, Mikey College T, MD   ipratropium-albuterol (DUONEB) 0.5-2.5 (3) MG/3ML nebulizer solution 3 mL, 3 mL, Nebulization, TID, Mikey College T, MD, 3 mL at 11/02/22 0729   mirtazapine (REMERON) tablet 15 mg, 15 mg, Oral, QHS, Mikey College T, MD, 15 mg at 11/01/22 2146   mycophenolate (CELLCEPT) capsule 500 mg, 500 mg, Oral, Charolette Forward, Ping T, MD, 500 mg at 11/01/22 0601   oxybutynin (DITROPAN) tablet 5 mg, 5 mg, Oral, Q8H PRN, Mikey College T, MD   tamsulosin [facility]) capsule 0.4 mg, 0.4 mg, Oral, QPC breakfast, Mikey College T, MD, 0.4 mg at 11/01/22 1022    ALLERGIES   Ace inhibitors     REVIEW OF SYSTEMS  Review of Systems:  Gen:  Denies  fever, sweats, chills weigh loss  HEENT: Denies blurred vision, double vision, ear pain, eye pain, hearing loss, nose bleeds, sore throat Cardiac:  No dizziness, chest pain or heaviness, chest tightness,edema Resp:   reports dyspnea chronically  Gi: Denies swallowing difficulty, stomach pain, nausea or vomiting, diarrhea, constipation, bowel incontinence Gu:  Denies bladder incontinence, burning urine Ext:   Denies Joint pain, stiffness or swelling Skin: Denies  skin rash, easy bruising or bleeding or hives Endoc:  Denies polyuria, polydipsia , polyphagia or weight change Psych:   Denies depression, insomnia or hallucinations   Other:  All other systems negative   VS: There were no vitals taken for this visit.     PHYSICAL EXAM    GENERAL:NAD, no fevers, chills, no weakness no fatigue HEAD: Normocephalic, atraumatic.  EYES: Pupils equal, round, reactive to light. Extraocular muscles intact. No scleral icterus.  MOUTH: Moist mucosal membrane. Dentition intact. No abscess noted.  EAR, NOSE, THROAT: Clear without exudates. No external lesions.  NECK: Supple. No thyromegaly. No nodules. No JVD.  PULMONARY: decreased breath sounds with mild rhonchi worse at bases bilaterally.  CARDIOVASCULAR: S1 and S2. Regular rate and rhythm. No murmurs, rubs, or  gallops. No edema. Pedal pulses 2+ bilaterally.  GASTROINTESTINAL: Soft, nontender, nondistended. No masses. Positive bowel sounds. No hepatosplenomegaly.  MUSCULOSKELETAL: No swelling, clubbing, or edema. Range of motion full in all extremities.  NEUROLOGIC: Cranial nerves II through XII are intact. No gross focal neurological deficits. Sensation intact. Reflexes intact.  SKIN: No ulceration, lesions, rashes, or cyanosis. Skin warm and dry. Turgor intact.  PSYCHIATRIC: Mood, affect within normal limits. The patient is awake, alert and oriented x 3. Insight, judgment intact.       IMAGING    arrative  CLINICAL DATA:  Cachectic with history of 60 pound weight loss  EXAM: CT ANGIOGRAPHY CHEST  CT ABDOMEN AND PELVIS WITH CONTRAST  TECHNIQUE: Multidetector CT imaging of the chest was performed using the standard protocol during bolus administration of intravenous ...        Study Result  Narrative & Impression  CLINICAL DATA:  Cachectic with history of 60 pound weight loss   EXAM: CT ANGIOGRAPHY CHEST   CT ABDOMEN AND PELVIS WITH CONTRAST   TECHNIQUE: Multidetector CT imaging of the chest was performed using the standard protocol during bolus administration of intravenous contrast. Multiplanar CT image reconstructions and MIPs were obtained to evaluate the vascular anatomy. Multidetector CT imaging of the abdomen and pelvis was performed using the standard protocol during bolus administration of intravenous contrast.   RADIATION DOSE REDUCTION: This exam was performed according to the departmental dose-optimization program which includes automated exposure control, adjustment of the mA and/or kV according to patient size and/or use of iterative reconstruction technique.   CONTRAST:  75mL OMNIPAQUE IOHEXOL 350 MG/ML SOLN   COMPARISON:  CT chest, abdomen and pelvis dated Aug 05, 2022   FINDINGS: CTA CHEST FINDINGS:   Cardiovascular: Scattered subsegmental pulmonary  emboli are seen in the bilateral lower lobes. Normal heart size. No CT evidence of right heart strain (0.7 ratio). No pericardial effusion. Normal caliber thoracic aorta with severe calcified plaque. Severe coronary artery calcifications status post CABG. Dilated main pulmonary artery, measuring up to 3.4 cm.   Mediastinum/Nodes: Esophagus and thyroid are unremarkable. Enlarged mediastinal and right hilar nodes, increased in size. Reference right hilar lymph node measures 11 mm short axis, previously 11 mm. Precarinal lymph node measures 14 mm,  previously 10 mm.   Lungs/Pleura: Central airways are patent. Diffuse bronchial wall thickening with scattered areas of mucous plugging. Numerous pulmonary nodules some with cavitation, are seen throughout the lungs, but most severe in the upper lobes. New cavitary mass of the lingula measuring 6.0 x 3.6 cm on series 5, image 89, findings are otherwise similar. No pleural effusion.   Musculoskeletal: Prior median sternotomy. No acute or significant osseous findings.   Review of the MIP images confirms the above findings.   CT ABDOMEN and PELVIS FINDINGS   Hepatobiliary: Unchanged low-attenuation lesion of the central liver measuring 13 mm on series 2, image 18. No gallstones, gallbladder wall thickening, or biliary dilatation.   Pancreas: Unremarkable. No pancreatic ductal dilatation or surrounding inflammatory changes.   Spleen: Normal in size without focal abnormality.   Adrenals/Urinary Tract: Bilateral adrenal glands are unremarkable. No hydronephrosis. Punctate nonobstructing stone of the left kidney. Thick-walled urinary bladder.   Stomach/Bowel: Stomach is within normal limits. Appendix appears normal. No evidence of bowel wall thickening, distention, or inflammatory changes.   Vascular/Lymphatic: Abdominal aortic aneurysm measuring 4.3 by 3.8 cm, unchanged when compared with the prior exam. 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 No enlarged lymph nodes seen in the chest.   Reproductive: Heterogeneous and enlarged prostate, similar to prior exam   Other: No abdominal wall hernia or abnormality. No abdominopelvic ascites.   Musculoskeletal: No acute or significant osseous findings.   Review of the MIP images confirms the above findings.   IMPRESSION: Chest CTA:   1. Scattered subsegmental pulmonary emboli in the bilateral lower lobes. 2. 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 Finding may be due to acute worsening of underlying infection, although neoplasm can not be excluded. Short-term follow-up chest CT in 1-2 months is recommended to ensure resolution. 3. Enlarged mediastinal and right hilar nodes, increased in size, possibly reactive. Recommend attention on follow-up.   Abdomen and pelvis CT:   1. No acute findings in the abdomen or pelvis. 2. Unchanged indeterminate low-attenuation lesion of the central liver measuring 13 mm. Could be completely characterized with abdominal MRI, this can be non emergently. 3. Stable abdominal aortic aneurysm measuring to 4.3 cm. Recommend follow-up every 12 months and vascular consultation. This recommendation follows ACR consensus guidelines: White Paper of the ACR Incidental Findings Committee II on Vascular Findings. J Am Coll Radiol 2013; 10:789-794. 4. 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 Thick-walled urinary bladder, likely due to chronic outlet obstruction given associated prostatomegaly. 5. 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   Critical Value/emergent results were called by telephone at the time of interpretation on 10/30/2022 at 3:08 pm to provider DAVID WELLS , who verbally acknowledged these results.     Electronically Signed   By: Allegra Lai M.D.   On: 10/30/2022 15:16      ASSESSMENT/PLAN   Acute on chronic hypoxemic respiratory failure   - background of ILD with systemic inflammatory disorder   -has had several episodes of bacterial pneumonia    -Status post bronchoscopy with bronchoalveolar lavage growing MRSA -Agree with current antimicrobials including Cefepime/zosyn -poor long term prognosis - palliative care  Non massive pulmonary embolism    - likely due to ILD/sjogrens    - patient on anticoagualtion -patient is covid negative -DVT negative  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  -Continue CellCept 500 mg  daily  Coronary artery disease -Continue  home regimen -Coreg -Plavix  Chronic bronchiectasis  -Mucomyst 20% nebulized 4 mL twice daily  Severe protein calorie malnutrition     Complicates management and recovery    - bitemporal wasting noted, peripheral muscle weakness noted    - supplemental nurioushment    -dronabinol+Remeron    - would not recommend Megace due to increase risk for DVT/PE   GI DVT prophylaxis -Continue with Lovenox      Thank you for allowing me to participate in the care of this patient.   Patient/Family are satisfied with care plan and all questions have been answered.    Provider disclosure: Patient with at least one acute or chronic illness or injury that poses a threat to life or bodily function and is being managed actively during this encounter.  All of the below services have been performed independently by signing provider:  review of prior documentation from internal and or external health records.  Review of previous and current lab results.  Interview and comprehensive assessment during patient visit today. Review of current and previous chest radiographs/CT scans. Discussion of management and test interpretation with health care team and patient/family.   This document was prepared using Dragon voice recognition software and may include unintentional  dictation errors.     Vida Rigger, M.D.  Division of Pulmonary & Critical Care Medicine

## 2022-11-02 NOTE — Consult Note (Addendum)
ANTICOAGULATION CONSULT NOTE   Pharmacy Consult for heparin infusion Indication: pulmonary embolus  Allergies  Allergen Reactions   Ace Inhibitors Cough    Patient Measurements: Height: 5\' 10"  (177.8 cm) Weight: 59.1 kg (130 lb 4.7 oz) IBW/kg (Calculated) : 73 Heparin Dosing Weight: 59.1 kg  Vital Signs:    Labs: Recent Labs    10/30/22 1208 10/30/22 2322 10/31/22 0456 10/31/22 0908 10/31/22 2055 11/01/22 0635 11/01/22 0817 11/01/22 2103 11/02/22 0655  HGB 9.1*  --  7.2*   < > 7.7* 8.5*  --   --  8.8*  HCT 30.2*  --  23.1*   < > 24.2* 26.5*  --   --  27.3*  PLT 518*  --  449*  --   --  416*  --   --  441*  APTT 35  --   --   --   --   --   --   --   --   LABPROT 14.9  --   --   --   --   --   --   --   --   INR 1.2  --   --   --   --   --   --   --   --   HEPARINUNFRC  --    < >  --    < > <0.10*  --  <0.10* 0.10* 0.16*  CREATININE 1.03  --  0.82  --   --   --   --   --  0.81   < > = values in this interval not displayed.    Estimated Creatinine Clearance: 60.8 mL/min (by C-G formula based on SCr of 0.81 mg/dL).   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    Chronic anemia    COVID-19    Depression    Dyslipidemia    Dyspnea    ED (erectile dysfunction)    Elevated PSA    Falls    Gross hematuria    History of methicillin resistant staphylococcus aureus (MRSA) 02/2022   + PCR nasal swab   Hx of hypotension    was placed on midodrine and then taken off   Hypertension    Interstitial lung disease (HCC)    Ischemic cardiomyopathy    with left ventricular ejection fraction of 35%   Left ventricular aneurysm    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  Patient found to have PE on CT.  Pharmacy consulted to initiate heparin infusion.  8/23  2322 HL <0.10, subtherapeutic  8/24  0908 HL <0.10,  subtherapeutic 8/24  2055 HL <0.10,  subtherapeutic  8/25  0817 HL <0.10,  subtherapeutic  (note there was a delay in lab reporting-received result at ~1315) 8/25  2111 HL 0.10,  subtherapeutic  8/26 0655 HL 0.16, subtherapeutic  Goal of Therapy:  Heparin  level 0.3-0.7 units/ml Monitor platelets by anticoagulation protocol: Yes   Plan: Heparin level subtherapeutic at 0.16 on 1900 units/hr Given HL did not increase much after the last heparin bolus of 1750 units, will give an increased bolus of 50 units/kg (3000 units) x 1 and change rate to 2100 units/hr Will check another HL in 8 hours Daily CBC while on Heparin drip  Merryl Hacker, pharmD Clinical Pharmacist 11/02/2022

## 2022-11-02 NOTE — TOC Progression Note (Signed)
Transition of Care [facility]) - Progression Note    Patient Details  Name: Nicholas Alvarez MRN: [id number] Date of Birth: 03/24/1940  [facility] CM/SW Contact  Truddie Hidden, RN Phone Number: 11/02/2022, 12:35 PM  Clinical Narrative:    Per therapy recommendation patient needs a RW and HHPT. Spoke with patient's wife reagrding HH arrangements. fTHey do not have a preference of an agency. They have been advised once the patient has been accepted by the [facility] agency they will be contacted at discharge to schedule a SOC. They have been advised the fRW will be requested and delivered to the bedside.  Request for RW sent to [facility].         Expected Discharge Plan and Services                                               Social Determinants of Health (SDOH) Interventions SDOH Screenings   Food Insecurity: No Food Insecurity (10/30/2022)  Housing: Patient Unable To Answer (10/30/2022)  Transportation Needs: No Transportation Needs (10/30/2022)  Utilities: Not At Risk (10/30/2022)  Financial Resource Strain: Patient Declined (09/21/2022)   Received from [facility]  Tobacco Use: Medium Risk (10/30/2022)    Readmission Risk Interventions     No data to display

## 2022-11-03 DIAGNOSIS — J189 Pneumonia, unspecified organism: Secondary | ICD-10-CM | POA: Diagnosis not present

## 2022-11-03 LAB — CBC
HCT: 25.6 % — ABNORMAL LOW (ref 39.0–52.0)
Hemoglobin: 8 g/dL — ABNORMAL LOW (ref 13.0–17.0)
MCH: 25.9 pg — ABNORMAL LOW (ref 26.0–34.0)
MCHC: 31.3 g/dL (ref 30.0–36.0)
MCV: 82.8 fL (ref 80.0–100.0)
Platelets: 478 10*3/uL — ABNORMAL HIGH (ref 150–400)
RBC: 3.09 MIL/uL — ABNORMAL LOW (ref 4.22–5.81)
RDW: 15.3 % (ref 11.5–15.5)
WBC: 9.2 10*3/uL (ref 4.0–10.5)
nRBC: 0 % (ref 0.0–0.2)

## 2022-11-03 MED ORDER — ENSURE ENLIVE PO LIQD: 237.0000 mL | Freq: Three times a day (TID) | ORAL | Status: DC

## 2022-11-03 MED ORDER — PREDNISONE 10 MG PO TABS: ORAL_TABLET | ORAL | 0 refills | Status: AC

## 2022-11-03 MED ORDER — MIRABEGRON ER 25 MG PO TB24
25.0000 mg | ORAL_TABLET | Freq: Every day | ORAL | Status: DC
  Administered 2022-11-03: 25 mg via ORAL
  Filled 2022-11-03: qty 1

## 2022-11-03 MED ORDER — GUAIFENESIN ER 600 MG PO TB12: 600.0000 mg | ORAL_TABLET | Freq: Two times a day (BID) | ORAL | 0 refills | Status: DC

## 2022-11-03 MED ORDER — CLOPIDOGREL BISULFATE 75 MG PO TABS: 75.0000 mg | ORAL_TABLET | Freq: Every day | ORAL | Status: DC

## 2022-11-03 MED ORDER — ADULT MULTIVITAMIN W/MINERALS CH: 1.0000 | ORAL_TABLET | Freq: Every day | ORAL | Status: DC

## 2022-11-03 MED ORDER — FUROSEMIDE 40 MG PO TABS: ORAL_TABLET | ORAL | 3 refills | Status: DC

## 2022-11-03 MED ORDER — ASPIRIN 81 MG PO TBEC: 81.0000 mg | DELAYED_RELEASE_TABLET | Freq: Every day | ORAL | 12 refills | Status: DC

## 2022-11-03 MED ORDER — FUROSEMIDE 20 MG PO TABS: ORAL_TABLET | ORAL | 0 refills | Status: DC

## 2022-11-03 MED ORDER — APIXABAN 5 MG PO TABS: ORAL_TABLET | ORAL | 0 refills | Status: AC

## 2022-11-03 NOTE — Progress Notes (Signed)
PULMONOLOGY         Date: 11/03/2022,   MRN# [id number] Nicholas Alvarez [birth year]     AdmissionWeight: 59.1 kg                 CurrentWeight: 59.1 kg  Referring provider: DrAlexander   CHIEF COMPLAINT:    Multifocal pneumonia with failure of outpatient therapy.    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  Starting about 6 months ago, patient started to develop a cough, intermittently he will cough up some scanty phlegm and since then has had worsening of exertional dyspnea and lost of appetite and collectively he lost about 60 pounds.  2 weeks ago patient underwent a urology procedure, subsequently stopped taking almost all of his medications including the 3 antibiotics.    He has had copious amounts of phlegm with forceful cough that is severe in intensity and lasting day and night.  He has been progressively more weak and has also lost weight due to diminished appetite. He had CT PE done which I reviewed with findings showing bilateral cavitary lung disease which is difficult to tell from infection vs cancer vs fibronadular cavitary inflammaty lesions from ILD. He is empirically being treated for infection.  PCCM consultation for additional evaluation management of this complex respiratory patient.   11/02/22- patient s/p TTE with HFrEF, complains of bladder incontinence.  He is improved from respiratory perspective. He should start PT/OT with dc planning and outpatient follow up.   11/03/22- patient is up awake smiling and reporting feeling well. He shares he has been ambulatory to bathroom and felt ok.  He did have PT with some assitance  needs notated.  He is being optimized for discharge. Plan to continue triple antibiotic therapy and steroid over next 6 weeks.   PAST MEDICAL HISTORY   Past Medical History:  Diagnosis Date   Acute hypoxemic respiratory failure (HCC)    BPH (benign prostatic hyperplasia)    Bronchiectasis (HCC)    CAD (coronary artery disease)    Post anterior wall myocardial infarction and 5-vessel coronary bypass    Chronic anemia    COVID-19    Depression    Dyslipidemia    Dyspnea    ED (erectile dysfunction)    Elevated PSA    Falls    Gross hematuria    History of methicillin resistant staphylococcus aureus (MRSA) 02/2022   + PCR nasal swab   Hx of hypotension    was placed on midodrine and then taken off   Hypertension    Interstitial lung disease (HCC)    Ischemic cardiomyopathy    with left ventricular ejection fraction of 35%   Left ventricular aneurysm    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   Myocardial infarction (HCC) 2009   Pneumonia    Protein-calorie malnutrition, severe (HCC)    S/P CABG x 5 2010   Sepsis due to pneumonia (HCC)    Sjogren's syndrome (HCC)    lung involvment     SURGICAL HISTORY   Past Surgical History:  Procedure Laterality Date   BRONCHIAL WASHINGS N/A 01/16/2022   Procedure: BRONCHIAL WASHINGS;  Surgeon: Vida Rigger, MD;  Location: [facility]  ORS;  Service: Thoracic;  Laterality: N/A;   CARDIAC CATHETERIZATION  03/2008   COLONOSCOPY     CORONARY ARTERY BYPASS GRAFT  03/12/2008   HOLEP-LASER ENUCLEATION OF THE PROSTATE WITH MORCELLATION N/A 10/12/2022   Procedure: HOLEP-LASER ENUCLEATION OF THE PROSTATE WITH MORCELLATION;  Surgeon: Vanna Scotland, MD;  Location: [facility] ORS;  Service: Urology;  Laterality: N/A;   PROSTATE BIOPSY  11/2009   RIGID BRONCHOSCOPY N/A 01/16/2022   Procedure: RIGID BRONCHOSCOPY;  Surgeon: Vida Rigger, MD;  Location: [facility] ORS;  Service: Thoracic;  Laterality: N/A;     FAMILY HISTORY    Family History  Problem Relation Age of Onset   Coronary artery disease Other    Heart attack Sister 10   Heart attack Maternal Grandfather 40   Heart attack Paternal Grandfather 56   COPD Mother    Pneumonia Father    Cancer Sister      SOCIAL HISTORY   Social History   Tobacco Use   Smoking status: Former    Types: Cigarettes    Passive exposure: Past   Smokeless tobacco: Never   Tobacco comments:    Quit around age 53  Vaping Use   Vaping status: Never Used  Substance Use Topics   Alcohol use: Yes    Alcohol/week: 12.0 standard drinks of alcohol    Types: 12 Shots of liquor per week    Comment: occ   Drug use: No     MEDICATIONS    Home Medication:     Current Medication:  Current Facility-Administered Medications:    acetylcysteine (MUCOMYST) 20 % nebulizer / oral solution 3 mL, 3 mL, Nebulization, BID, Sunnie Nielsen, DO, 3 mL at 11/03/22 0705   albuterol (PROVENTIL) (2.5 MG/3ML) 0.083% nebulizer solution 2.5 mg, 2.5 mg, Inhalation, Q6H PRN, Emeline General, MD   apixaban Everlene Balls) tablet 10 mg, 10 mg, Oral, BID, 10 mg at 11/02/22 2154 **FOLLOWED BY** [START ON 11/09/2022] apixaban (ELIQUIS) tablet 5 mg, 5 mg, Oral, BID, Nazari, Walid A, RPH   aspirin EC tablet 81 mg, 81 mg, Oral, Daily, Chipper Herb, Ping T, MD, 81 mg at 11/02/22 0830   ceFEPIme (MAXIPIME) 2 g in sodium chloride 0.9 % 100 mL IVPB, 2 g, Intravenous, Q8H, Hallaji, Sheema M, RPH, Last Rate: 200 mL/hr at 11/03/22 0502, 2 g at 11/03/22 0502   dronabinol (MARINOL) capsule 2.5 mg, 2.5 mg, Oral, BID AC, Zhang, Ping T, MD, 2.5 mg at 11/02/22 1843   feeding supplement (ENSURE ENLIVE / ENSURE PLUS) liquid 237 mL, 237 mL, Oral, TID BM, Sunnie Nielsen, DO   finasteride (PROSCAR) tablet 5 mg, 5 mg, Oral, BH-q7a, Zhang, Ping T, MD, 5 mg at 11/02/22 0830   guaiFENesin (MUCINEX) 12 hr tablet 600 mg, 600 mg, Oral, BID, Mikey College T, MD, 600 mg at 11/02/22 2153   HYDROcodone-acetaminophen (NORCO/VICODIN)  5-325 MG per tablet 1-2 tablet, 1-2 tablet, Oral, Q6H PRN, Mikey College T, MD   ipratropium-albuterol (DUONEB) 0.5-2.5 (3) MG/3ML nebulizer solution 3 mL, 3 mL, Nebulization, BID, Sunnie Nielsen, DO, 3 mL at 11/03/22 0708   methylPREDNISolone sodium succinate (SOLU-MEDROL) 40 mg/mL injection 40 mg, 40 mg, Intravenous, Q24H, Juni Glaab, MD, 40 mg at 11/02/22 1843   mirtazapine (REMERON) tablet 15 mg, 15 mg, Oral, QHS, Mikey College T, MD, 15 mg at 11/02/22 2154   multivitamin with minerals tablet 1 tablet, 1 tablet, Oral, Daily, Sunnie Nielsen, DO, 1 tablet at 11/02/22 1843   oxybutynin (DITROPAN) tablet 5 mg, 5 mg, Oral, Q8H PRN,  Emeline General, MD   tamsulosin [facility]) capsule 0.4 mg, 0.4 mg, Oral, QPC breakfast, Mikey College T, MD, 0.4 mg at 11/02/22 0830    ALLERGIES   Ace inhibitors     REVIEW OF SYSTEMS    Review of Systems:  Gen:  Denies  fever, sweats, chills weigh loss  HEENT: Denies blurred vision, double vision, ear pain, eye pain, hearing loss, nose bleeds, sore throat Cardiac:  No dizziness, chest pain or heaviness, chest tightness,edema Resp:   reports dyspnea chronically  Gi: Denies swallowing difficulty, stomach pain, nausea or vomiting, diarrhea, constipation, bowel incontinence Gu:  Denies bladder incontinence, burning urine Ext:   Denies Joint pain, stiffness or swelling Skin: Denies  skin rash, easy bruising or bleeding or hives Endoc:  Denies polyuria, polydipsia , polyphagia or weight change Psych:   Denies depression, insomnia or hallucinations   Other:  All other systems negative   VS: BP 126/70 (BP Location: Left Arm)   Pulse 86   Temp 98.4 F (36.9 C) (Oral)   Resp 19   Ht 5\' 10"  (1.778 m)   Wt 59.1 kg   SpO2 95%   BMI 18.69 kg/m      PHYSICAL EXAM    GENERAL:NAD, no fevers, chills, no weakness no fatigue HEAD: Normocephalic, atraumatic.  EYES: Pupils equal, round, reactive to light. Extraocular muscles intact. No scleral icterus.   MOUTH: Moist mucosal membrane. Dentition intact. No abscess noted.  EAR, NOSE, THROAT: Clear without exudates. No external lesions.  NECK: Supple. No thyromegaly. No nodules. No JVD.  PULMONARY: decreased breath sounds with mild rhonchi worse at bases bilaterally.  CARDIOVASCULAR: S1 and S2. Regular rate and rhythm. No murmurs, rubs, or gallops. No edema. Pedal pulses 2+ bilaterally.  GASTROINTESTINAL: Soft, nontender, nondistended. No masses. Positive bowel sounds. No hepatosplenomegaly.  MUSCULOSKELETAL: No swelling, clubbing, or edema. Range of motion full in all extremities.  NEUROLOGIC: Cranial nerves II through XII are intact. No gross focal neurological deficits. Sensation intact. Reflexes intact.  SKIN: No ulceration, lesions, rashes, or cyanosis. Skin warm and dry. Turgor intact.  PSYCHIATRIC: Mood, affect within normal limits. The patient is awake, alert and oriented x 3. Insight, judgment intact.       IMAGING    arrative  CLINICAL DATA:  Cachectic with history of 60 pound weight loss  EXAM: CT ANGIOGRAPHY CHEST  CT ABDOMEN AND PELVIS WITH CONTRAST  TECHNIQUE: Multidetector CT imaging of the chest was performed using the standard protocol during bolus administration of intravenous ...        Study Result  Narrative & Impression  CLINICAL DATA:  Cachectic with history of 60 pound weight loss   EXAM: CT ANGIOGRAPHY CHEST   CT ABDOMEN AND PELVIS WITH CONTRAST   TECHNIQUE: Multidetector CT imaging of the chest was performed using the standard protocol during bolus administration of intravenous contrast. Multiplanar CT image reconstructions and MIPs were obtained to evaluate the vascular anatomy. Multidetector CT imaging of the abdomen and pelvis was performed using the standard protocol during bolus administration of intravenous contrast.   RADIATION DOSE REDUCTION: This exam was performed according to the departmental dose-optimization program which  includes automated exposure control, adjustment of the mA and/or kV according to patient size and/or use of iterative reconstruction technique.   CONTRAST:  75mL OMNIPAQUE IOHEXOL 350 MG/ML SOLN   COMPARISON:  CT chest, abdomen and pelvis dated Aug 05, 2022   FINDINGS: CTA CHEST FINDINGS:   Cardiovascular: Scattered subsegmental pulmonary emboli are seen in  the bilateral lower lobes. Normal heart size. No CT evidence of right heart strain (0.7 ratio). No pericardial effusion. Normal caliber thoracic aorta with severe calcified plaque. Severe coronary artery calcifications status post CABG. Dilated main pulmonary artery, measuring up to 3.4 cm.   Mediastinum/Nodes: Esophagus and thyroid are unremarkable. Enlarged mediastinal and right hilar nodes, increased in size. Reference right hilar lymph node measures 11 mm short axis, previously 11 mm. Precarinal lymph node measures 14 mm, previously 10 mm.   Lungs/Pleura: Central airways are patent. Diffuse bronchial wall thickening with scattered areas of mucous plugging. Numerous pulmonary nodules some with cavitation, are seen throughout the lungs, but most severe in the upper lobes. New cavitary mass of the lingula measuring 6.0 x 3.6 cm on series 5, image 89, findings are otherwise similar. No pleural effusion.   Musculoskeletal: Prior median sternotomy. No acute or significant osseous findings.   Review of the MIP images confirms the above findings.   CT ABDOMEN and PELVIS FINDINGS   Hepatobiliary: Unchanged low-attenuation lesion of the central liver measuring 13 mm on series 2, image 18. No gallstones, gallbladder wall thickening, or biliary dilatation.   Pancreas: Unremarkable. No pancreatic ductal dilatation or surrounding inflammatory changes.   Spleen: Normal in size without focal abnormality.   Adrenals/Urinary Tract: Bilateral adrenal glands are unremarkable. No hydronephrosis. Punctate nonobstructing stone of the  left kidney. Thick-walled urinary bladder.   Stomach/Bowel: Stomach is within normal limits. Appendix appears normal. No evidence of bowel wall thickening, distention, or inflammatory changes.   Vascular/Lymphatic: Abdominal aortic aneurysm measuring 4.3 by 3.8 cm, unchanged when compared with the prior exam. 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 No enlarged lymph nodes seen in the chest.   Reproductive: Heterogeneous and enlarged prostate, similar to prior exam   Other: No abdominal wall hernia or abnormality. No abdominopelvic ascites.   Musculoskeletal: No acute or significant osseous findings.   Review of the MIP images confirms the above findings.   IMPRESSION: Chest CTA:   1. Scattered subsegmental pulmonary emboli in the bilateral lower lobes. 2. 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 Finding may be due to acute worsening of underlying infection, although neoplasm can not be excluded. Short-term follow-up chest CT in 1-2 months is recommended to ensure resolution. 3. Enlarged mediastinal and right hilar nodes, increased in size, possibly reactive. Recommend attention on follow-up.   Abdomen and pelvis CT:   1. No acute findings in the abdomen or pelvis. 2. Unchanged indeterminate low-attenuation lesion of the central liver measuring 13 mm. Could be completely characterized with abdominal MRI, this can be non emergently. 3. Stable abdominal aortic aneurysm measuring to 4.3 cm. Recommend follow-up every 12 months and vascular consultation. This recommendation follows ACR consensus guidelines: White Paper of the ACR Incidental Findings Committee II on Vascular Findings. J Am Coll Radiol 2013; 10:789-794. 4. 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 Thick-walled urinary bladder, likely due to chronic outlet obstruction given associated  prostatomegaly. 5. 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   Critical Value/emergent results were called by telephone at the time of interpretation on 10/30/2022 at 3:08 pm to provider DAVID WELLS , who verbally acknowledged these results.     Electronically Signed   By: Allegra Lai M.D.   On: 10/30/2022 15:16     ASSESSMENT/PLAN   Acute on chronic hypoxemic respiratory failure   - background of ILD with systemic inflammatory disorder   -  has had several episodes of bacterial pneumonia    -Status post bronchoscopy with bronchoalveolar lavage growing MRSA -continue Zithromax, rifampin, ethambutol on dc  -continue pred 50 with tapering slowly 5mg  per week  Non massive pulmonary embolism    - likely due to ILD/sjogrens    - patient on anticoagualtion -patient is covid negative -DVT negative  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  -hold celcept  Coronary artery disease -Continue home regimen -Coreg -Plavix  Chronic bronchiectasis  -Mucomyst 20% nebulized 4 mL twice daily  Severe protein calorie malnutrition     Complicates management and recovery    - bitemporal wasting noted, peripheral muscle weakness noted    - supplemental nurioushment    -dronabinol+Remeron    - would not recommend Megace due to increase risk for DVT/PE   GI DVT prophylaxis -Continue with Lovenox      Thank you for allowing me to participate in the care of this patient.   Patient/Family are satisfied with care plan and all questions have been answered.    Provider disclosure: Patient with at least one acute or chronic illness or injury that poses a threat to life or bodily function and is being managed actively during this encounter.  All of the below services have been performed independently by signing provider:  review of prior documentation from internal and or external health records.  Review of previous and current lab results.  Interview  and comprehensive assessment during patient visit today. Review of current and previous chest radiographs/CT scans. Discussion of management and test interpretation with health care team and patient/family.   This document was prepared using Dragon voice recognition software and may include unintentional dictation errors.     Vida Rigger, M.D.  Division of Pulmonary & Critical Care Medicine

## 2022-11-03 NOTE — Progress Notes (Signed)
Pharmacy Antibiotic Note  Nicholas Alvarez is a 82 y.o. male admitted on 10/30/2022 with severe cavitary pneumonia .  Pharmacy has been consulted for Cefepime dosing.  Plan: Continue cefepime 2g IV every 8 hours for CrCl >60  Pharmacy will continue to monitor and adjust as needed.   Height: 5\' 10"  (177.8 cm) Weight: 59.1 kg (130 lb 4.7 oz) IBW/kg (Calculated) : 73  Temp (24hrs), Avg:98.5 F (36.9 C), Min:98.4 F (36.9 C), Max:98.6 F (37 C)  Recent Labs  Lab 10/30/22 1208 10/30/22 1516 10/31/22 0456 11/01/22 0635 11/02/22 0655 11/03/22 0302  WBC 18.8*  --  13.3* 12.1* 10.5 9.2  CREATININE 1.03  --  0.82  --  0.81  --   LATICACIDVEN 2.3* 1.6  --   --   --   --     Estimated Creatinine Clearance: 60.8 mL/min (by C-G formula based on SCr of 0.81 mg/dL).    Allergies  Allergen Reactions   Ace Inhibitors Cough   Antimicrobials this admission: 8/24 Cefepime>>  8/23 Zosyn >> 8/24 8/23 vancomycin >> x1   Microbiology results: 8/23 BCx: NGTD 8/24 MRSA PCR: pending 8/24 AFB: pending   Thank you for allowing pharmacy to be a part of this patient's care.  Merryl Hacker, PharmD Clinical Pharmacist 11/03/2022 8:00 AM

## 2022-11-03 NOTE — TOC Transition Note (Addendum)
Transition of Care [facility]) - CM/SW Discharge Note   Patient Details  Name: Nicholas Alvarez MRN: [id number] Date of Birth: May 10, 1940  [facility] CM/SW Contact:  Truddie Hidden, RN Phone Number: 11/03/2022, 12:27 PM   Clinical Narrative:    Spoke with patient's wife, Nicholas Alvarez regarding therapies recommendation for [facility]. She is agreeable to [facility] and does not have a preference. Referral sent and accepted by Barbara Cower from [facility].  Patient wife advised patient has been accepted by [facility] and she would be contacted for [facility] appointment within 48 hours.    Per Westley Hummer was delivered yesterday.   TOC signing off.         Patient Goals and CMS Choice      Discharge Placement                         Discharge Plan and Services Additional resources added to the After Visit Summary for                                       Social Determinants of Health (SDOH) Interventions SDOH Screenings   Food Insecurity: No Food Insecurity (10/30/2022)  Housing: Patient Unable To Answer (10/30/2022)  Transportation Needs: No Transportation Needs (10/30/2022)  Utilities: Not At Risk (10/30/2022)  Financial Resource Strain: Patient Declined (09/21/2022)   Received from [facility]  Tobacco Use: Medium Risk (10/30/2022)     Readmission Risk Interventions     No data to display

## 2022-11-03 NOTE — Plan of Care (Signed)

## 2022-11-03 NOTE — Progress Notes (Signed)
[facility] HF Stewardship  PCP: Kandyce Rud, MD  PCP-Cardiologist: None  HPI: Nicholas Alvarez is a 82 y.o. 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 Patient was diagnosed with MAI infection in 2023 and has been on a 3 antibiotic regimen including azithromycin, myambutol, and rifampin 3 times a week. CTA on admission showed scattered subsegmental PE in bilateral lower lobes.  Pertinent cardiac history: History of CABG in 2010. Cardiac MRI in 2010 showed LVEF of 40% with delayed enhancement pattern suggestive of infarction at the apex. Most recent TTE this admission showed LVEF of 30-35% with mild mitral regurgitation.  Pertinent Lab Values: Creatinine  Date Value Ref Range Status  10/24/2012 0.91 0.60 - 1.30 mg/dL Final   Creatinine, Ser  Date Value Ref Range Status  11/02/2022 0.81 0.61 - 1.24 mg/dL Final   BUN  Date Value Ref Range Status  11/02/2022 18 8 - 23 mg/dL Final  16/12/9602 16 7 - 18 mg/dL Final   Potassium  Date Value Ref Range Status  11/02/2022 3.8 3.5 - 5.1 mmol/L Final  10/24/2012 3.7 3.5 - 5.1 mmol/L Final   Sodium  Date Value Ref Range Status  11/02/2022 139 135 - 145 mmol/L Final  10/24/2012 138 136 - 145 mmol/L Final   Magnesium  Date Value Ref Range Status  10/30/2022 2.2 1.7 - 2.4 mg/dL Final    Comment:    Performed at [facility], [address] [place], [place] [address]   Hgb A1c MFr Bld  Date Value Ref Range Status  03/07/2008  4.6 - 6.1 % Final   5.9 (NOTE)   The ADA recommends the following therapeutic goal for glycemic   control related to Hgb A1C measurement:   Goal of Therapy:   < 7.0% Hgb A1C   Reference: American Diabetes Association: Clinical Practice   Recommendations 2008, Diabetes Care,  2008, 31:(Suppl 1).   TSH  Date Value Ref Range Status   10/30/2022 2.161 0.350 - 4.500 uIU/mL Final    Comment:    Performed by a 3rd Generation assay with a functional sensitivity of <=0.01 uIU/mL. Performed at [facility], [address] [place], [place] [address]   03/08/2008 2.182  0.350 - 4.500 uIU/mL Final    Vital Signs: Temp:  [98.4 F (36.9 C)-98.6 F (37 C)] 98.4 F (36.9 C) (08/27 0500) Pulse Rate:  [78-105] 86 (08/27 0500) Cardiac Rhythm: Normal sinus rhythm (08/26 1900) Resp:  [16-19] 19 (08/27 0004) BP: (121-126)/(70-91) 126/70 (08/27 0500) SpO2:  [95 %-97 %] 95 % (08/27 0711)   Intake/Output Summary (Last 24 hours) at 11/03/2022 1003 Last data filed at 11/03/2022 0502 Gross per 24 hour  Intake 800 ml  Output 200 ml  Net 600 ml    Current Inpatient Medications:  None  Prior to admission HF Medications:  Beta Blocker: Carvedilol 12.5 mg BID  Assessment: 1. 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 NYHA class I-II symptoms.  -Respiratory symptoms secondary to PNA +/- ILD rather than CHF. On room air. -Renal function and BP are WNL. No BNP this admission. Suspect euvolemia. -Poor candidate for SGLT2i given malnutrition. Can consider restarting home beta blocker.   Plan: 1) Medication changes recommended at this time: -Consider restarting home carvedilol at reduced dose of 6.25 mg BID  2) Patient assistance: -None  3) Education: -To be  completed prior to discharge.   Medication Assistance / Insurance Benefits Check:  Does the patient have prescription insurance? Prescription Insurance: Medicare ([employer])  Type of insurance plan:   Does the patient qualify for medication assistance through manufacturers or grants? Pending   Outpatient Pharmacy:  Prior to admission outpatient pharmacy: [facility]     Thank you for involving pharmacy in this patient's care.  Enos Fling, PharmD, BCPS Phone - [phone] Clinical Pharmacist 11/03/2022 10:14 AM

## 2022-11-03 NOTE — Discharge Summary (Signed)
Physician Discharge Summary   Patient: Nicholas Alvarez MRN: [id number]  DOB: 1941/01/07   Admit:     Date of Admission: 10/30/2022 Admitted from: home   Discharge: Date of discharge: 11/03/22 Disposition: Home w/ HH  Condition at discharge: fair  CODE STATUS: DNR     Discharge Physician: Sunnie Nielsen, DO [facility]     PCP: Kandyce Rud, MD  Recommendations for Outpatient Follow-up:  Follow up with PCP Kandyce Rud, MD in 1-2 weeks Please obtain labs/tests: CBC, BMP Consider f/u w/ cardiology / palliative - see echo results  Ensure follow up w/ pulmonology and infectious disease    Discharge Instructions     (HEART FAILURE PATIENTS) Call MD:  Anytime you have any of the following symptoms: 1) 3 pound weight gain in 24 hours or 5 pounds in 1 week 2) shortness of breath, with or without a dry hacking cough 3) swelling in the hands, feet or stomach 4) if you have to sleep on extra pillows at night in order to breathe.   Complete by: As directed    Diet - low sodium heart healthy   Complete by: As directed    Increase activity slowly   Complete by: As directed          Discharge Diagnoses: Principal Problem:   PNA (pneumonia) Active Problems:   Sepsis (HCC)   Bronchiectasis (HCC)   MAI (mycobacterium avium-intracellulare) [facility])       Hospital Course: Nicholas Alvarez is a 82 y.o. 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 He was sent from ID office for worsening of leukocytosis, cough and weight loss. 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  Starting about 6 months ago, patient started to develop a cough, intermittently he will cough up some scanty phlegm and since then has had worsening of exertional  dyspnea and lost of appetite and collectively he lost about 60 pounds.  2 weeks ago patient underwent a urology procedure, subsequently stopped taking almost all of his medications "feeling tired to take 100 pills a day" including the 3 antibiotics.   Yesterday, patient went to see  ID. WBC> 20,000 and was sent him to ED for further evaluation.  08/23: in ED, CTA incidental finding of bilateral PE. CT chest showed worsening and new cavity lesions likely recurrent MAI vs malignancy.  Pulmonary consulted, holding plavix in case needs bronch.  Palliative care consulted. Echo pending. DVT US pending. Neg COVID/Flu. Started on vanc + cefepime, heparin gtt.  08/24: Palliative following. Lactate, WBC, Plt improved. Hgb drop, transfusing 1 unit PRBC. D/w patient re: advanced care planning. DNR status, updated in chart. DVT neg. Echo pending. Per pulmonary, CT more c/w infection more so than cancer.  08/25: WBC trending down, Hgb improved. BCx NGx2d. Sputum Cx pending - never collected.  08/26: Echocardiogram: LVEF 30-35%, (+)RWMA, no evidence for R heart strain. Will d/c heparin gtt. Start eliquis. Continue IV abx, await pulm recs since sputum cx never sent.  08/27: doing well, abx plan as below, prednisone, hold cellcept, ok to go home. Pt wishes to follow outpatient w/ PCP to discuss CHF, he was instructed on s/s exacerbation and lasix Rx sent.   Consultants:  Palliative care Pulmonology   Procedures: none      ASSESSMENT & PLAN:  Sepsis d/t multifocal pneumonia Multifocal pneumonia Question of recurrent MAI pneumonia Restart MAI rx - zithromax, rifampin, ethambutol Hold cellcept  Prednisone 50 mg decrease by 5 mg weekly Follow w/ pulmonary and ID outpatient   Incidental bilateral subsegmental PE, unprovoked SIRS d/t PE vs sepsis d/t pneumonia but likely both - resolved Likely d/t ILD/Sjogren's DVT study negative Echocardiogram: LVEF 30-35%, (+)RWMA, no evidence for R heart strain   heparin drip d/c Continue w/ ELiquis D/c ASA  Chronic HFrEF New diagnosis on echo w/ LVEF 30-35%, (+)RWMA Does not appear to be in acute exacerbation, likely new dx of chronic problem D/w patient - he would like to discuss more w/ PCP Lasix prn ordered see med rec Strongly consider cardiology follow up to dicsuss further intervention vs prognosis / palliative measures  Acute on chronic hypoxemic respiratory failure d/t 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  No history of GI bleed  FOBT w/ next BM Consider outpatient follow-up with GI for colonoscopy if desired    BPH Recent HoLEP study, and pathology showed benign tissue. Admitting hospitalist d/w urology Dr. Apolinar Junes, who is okay with anticoagulation and resume Plavix (holding plavix for now in case needing bronchoscopy)  Finasteride, tamsulosin scheduled Oxybutinin prn    CAD Denied any chest pains Restart Plavix  aspirin stopped since he will be on eliquis dor PE  Holding beta blocker d/t borderline BP Holding statin    Severe protein calorie malnutrition Likely secondary to consuming conditions of chronic infection, management as above No history of seizure or intracranial disease, will start trial of Marinol  Regular diet  Start Ensure   Anxiety/depression Continue Remeron  Advanced care planning Detailed discussion 10/31/22 see hospitalist progress note Pt has capacity to understand his situation and give informed consent/refusal            Discharge Instructions  Allergies as of 11/03/2022        Reactions   Ace Inhibitors Cough        Medication List     STOP taking these medications    carvedilol 12.5 MG tablet Commonly known as: COREG   CellCept 500 MG tablet Generic drug: mycophenolate   cyanocobalamin 1000 MCG tablet Commonly known as: VITAMIN B12   ferrous sulfate 325 (65 FE) MG tablet   Gemtesa 75 MG Tabs Generic drug: Vibegron   Melatonin 10 MG Tabs   NON FORMULARY       TAKE these medications    albuterol 108 (90 Base) MCG/ACT inhaler Commonly known as: VENTOLIN HFA Inhale 1-2 puffs into the lungs every 6 (six) hours as needed for wheezing or shortness of breath.   apixaban 5 MG Tabs tablet Commonly known as: ELIQUIS Take 2 tablets (10 mg total) by mouth 2 (two) times daily for 6 days, THEN 1 tablet (5 mg total) 2 (two) times daily. Start taking on: November 03, 2022   azithromycin 500 MG tablet Commonly known as: ZITHROMAX Take 500 mg by mouth 3 (three) times a week.   clopidogrel 75 MG tablet Commonly known as: PLAVIX Take 75 mg by mouth daily.   ethambutol 400 MG tablet Commonly known as:  MYAMBUTOL Take 1,800 mg by mouth 3 (three) times a week. M, W, F-takes 4.5 tablets   feeding supplement Liqd Take 237 mLs by mouth 3 (three) times daily between meals.   finasteride 5 MG tablet Commonly known as: PROSCAR Take 5 mg by mouth every morning.   furosemide 40 MG tablet Commonly known as: LASIX Take to 1 tablet (20 mg total) by mouth ONCE OR TWICE daily (total daily dose 20-40 mg) as needed for up to 3 days for increased leg swelling, shortness of breath with lying down on your back, weight gain 5+ lbs over 1-2 days. Seek medical care if these symptoms are not improving with increased dose.   guaiFENesin 600 MG 12 hr tablet Commonly known as: MUCINEX Take 1 tablet (600 mg total) by mouth 2 (two) times daily.   HYDROcodone-acetaminophen 5-325 MG tablet Commonly known as: NORCO/VICODIN Take 1-2 tablets by mouth every 6 (six)  hours as needed for moderate pain.   mirtazapine 15 MG tablet Commonly known as: REMERON Take 1 tablet by mouth at bedtime.   multivitamin with minerals Tabs tablet Take 1 tablet by mouth daily. Start taking on: November 04, 2022   oxybutynin 5 MG tablet Commonly known as: DITROPAN Take 1 tablet (5 mg total) by mouth every 8 (eight) hours as needed for bladder spasms.   predniSONE 10 MG tablet Commonly known as: DELTASONE Take 5 tablets (50 mg total) by mouth daily for 7 days, THEN 4.5 tablets (45 mg total) daily for 7 days, THEN 4 tablets (40 mg total) daily for 7 days, THEN 3.5 tablets (35 mg total) daily for 7 days, THEN 3 tablets (30 mg total) daily for 7 days, THEN 2.5 tablets (25 mg total) daily for 7 days, THEN 2 tablets (20 mg total) daily for 7 days, THEN 1.5 tablets (15 mg total) daily for 7 days, THEN 1 tablet (10 mg total) daily for 7 days, THEN 0.5 tablets (5 mg total) daily for 7 days. Start taking on: November 03, 2022   rifampin 300 MG capsule Commonly known as: RIFADIN Take 600 mg by mouth 3 (three) times a week. M, W, F   simvastatin 40 MG tablet Commonly known as: ZOCOR Take 40 mg by mouth every morning.   tamsulosin 0.4 MG Caps capsule Commonly known as: FLOMAX Take 0.4 mg by mouth daily after breakfast.               Durable Medical Equipment  (From admission, onward)           Start     Ordered   11/02/22 1242  For home use only DME Walker rolling  Once       Question Answer Comment  Walker: With 5 Inch Wheels   Patient needs a walker to treat with the following condition Interstitial lung disease (HCC)   Patient needs a walker to treat with the following condition Pneumonia   Patient needs a walker to treat with the following condition Debility      11/02/22 1242              Allergies  Allergen Reactions   Ace Inhibitors Cough     Subjective: pt feeling well today, breathing fine, he is feeling more energetic and feeling like  he is gaining some weight, no SOB, cough is a bit better today    Discharge Exam: BP 126/70 (BP Location: Left Arm)   Pulse 86   Temp 98.4 F (36.9 C) (Oral)  Resp 19   Ht 5\' 10"  (1.778 m)   Wt 59.1 kg   SpO2 95%   BMI 18.69 kg/m  General: Pt is alert, awake, not in acute distress, frail  Cardiovascular: RRR, S1/S2 +, no rubs, no gallops Respiratory: CTA bilaterally, no wheezing, no rhonchi Abdominal: Soft, NT, ND, bowel sounds + Extremities: no edema, no cyanosis     The results of significant diagnostics from this hospitalization (including imaging, microbiology, ancillary and laboratory) are listed below for reference.     Microbiology: Recent Results (from the past 240 hour(s))  Resp panel by RT-PCR (RSV, Flu A&B, Covid) Anterior Nasal Swab     Status: None   Collection Time: 10/30/22 12:08 PM   Specimen: Anterior Nasal Swab  Result Value Ref Range Status   SARS Coronavirus 2 by RT PCR NEGATIVE NEGATIVE Final    Comment: (NOTE) SARS-CoV-2 target nucleic acids are NOT DETECTED.  The SARS-CoV-2 RNA is generally detectable in upper respiratory specimens during the acute phase of infection. The lowest concentration of SARS-CoV-2 viral copies this assay can detect is 138 copies/mL. A negative result does not preclude SARS-Cov-2 infection and should not be used as the sole basis for treatment or other patient management decisions. A negative result may occur with  improper specimen collection/handling, submission of specimen other than nasopharyngeal swab, presence of viral mutation(s) within the areas targeted by this assay, and inadequate number of viral copies(<138 copies/mL). A negative result must be combined with clinical observations, patient history, and epidemiological information. The expected result is Negative.  Fact Sheet for Patients:  BloggerCourse.com  Fact Sheet for Healthcare Providers:   SeriousBroker.it  This test is no t yet approved or cleared by the [place] FDA and  has been authorized for detection and/or diagnosis of SARS-CoV-2 by FDA under an Emergency Use Authorization (EUA). This EUA will remain  in effect (meaning this test can be used) for the duration of the COVID-19 declaration under Section 564(b)(1) of the Act, 21 U.S.C.section 360bbb-3(b)(1), unless the authorization is terminated  or revoked sooner.       Influenza A by PCR NEGATIVE NEGATIVE Final   Influenza B by PCR NEGATIVE NEGATIVE Final    Comment: (NOTE) The Xpert Xpress SARS-CoV-2/FLU/RSV plus assay is intended as an aid in the diagnosis of influenza from Nasopharyngeal swab specimens and should not be used as a sole basis for treatment. Nasal washings and aspirates are unacceptable for Xpert Xpress SARS-CoV-2/FLU/RSV testing.  Fact Sheet for Patients: BloggerCourse.com  Fact Sheet for Healthcare Providers: SeriousBroker.it  This test is not yet approved or cleared by the [place] FDA and has been authorized for detection and/or diagnosis of SARS-CoV-2 by FDA under an Emergency Use Authorization (EUA). This EUA will remain in effect (meaning this test can be used) for the duration of the COVID-19 declaration under Section 564(b)(1) of the Act, 21 U.S.C. section 360bbb-3(b)(1), unless the authorization is terminated or revoked.     Resp Syncytial Virus by PCR NEGATIVE NEGATIVE Final    Comment: (NOTE) Fact Sheet for Patients: BloggerCourse.com  Fact Sheet for Healthcare Providers: SeriousBroker.it  This test is not yet approved or cleared by the [place] FDA and has been authorized for detection and/or diagnosis of SARS-CoV-2 by FDA under an Emergency Use Authorization (EUA). This EUA will remain in effect (meaning this test can be used) for  the duration of the COVID-19 declaration under Section 564(b)(1) of the Act, 21 U.S.C. section 360bbb-3(b)(1), unless the authorization is terminated  or revoked.  Performed at [facility], [address] [place], [place] [address]   Blood Culture (routine x 2)     Status: None (Preliminary result)   Collection Time: 10/30/22 12:08 PM   Specimen: BLOOD  Result Value Ref Range Status   Specimen Description BLOOD BLOOD LEFT ARM  Final   Special Requests   Final    BOTTLES DRAWN AEROBIC AND ANAEROBIC Blood Culture adequate volume   Culture   Final    NO GROWTH 4 DAYS Performed at [facility], [address] [place], [place] [address]    Report Status PENDING  Incomplete  Blood Culture (routine x 2)     Status: None (Preliminary result)   Collection Time: 10/30/22 12:08 PM   Specimen: BLOOD  Result Value Ref Range Status   Specimen Description BLOOD BLOOD RIGHT ARM  Final   Special Requests   Final    BOTTLES DRAWN AEROBIC AND ANAEROBIC Blood Culture adequate volume   Culture   Final    NO GROWTH 4 DAYS Performed at [facility], [address], [place], [place] [address]    Report Status PENDING  Incomplete     Labs: BNP (last 3 results) No results for input(s): "BNP" in the last 8760 hours. Basic Metabolic Panel: Recent Labs  Lab 10/30/22 1208 10/31/22 0456 11/02/22 0655  NA 137 139 139  K 3.9 3.5 3.8  CL 104 108 108  CO2 23 24 24   GLUCOSE 143* 99 98  BUN 28* 22 18  CREATININE 1.03 0.82 0.81  CALCIUM 9.4 8.5* 8.9  MG 2.2  --   --    Liver Function Tests: Recent Labs  Lab 10/30/22 1208  AST 23  ALT 11  ALKPHOS 67  BILITOT 0.3  PROT 7.3  ALBUMIN 2.7*   No results for input(s): "LIPASE", "AMYLASE" in the last 168 hours. No results for input(s): "AMMONIA" in the last 168 hours. CBC: Recent Labs  Lab 10/30/22 1208 10/31/22 0456 10/31/22 1242 10/31/22 2055 11/01/22 0635 11/02/22 0655 11/03/22 0302  WBC 18.8* 13.3*   --   --  12.1* 10.5 9.2  NEUTROABS 16.7*  --   --   --   --   --   --   HGB 9.1* 7.2* 6.6* 7.7* 8.5* 8.8* 8.0*  HCT 30.2* 23.1* 21.2* 24.2* 26.5* 27.3* 25.6*  MCV 86.3 83.7  --   --  83.1 83.5 82.8  PLT 518* 449*  --   --  416* 441* 478*   Cardiac Enzymes: No results for input(s): "CKTOTAL", "CKMB", "CKMBINDEX", "TROPONINI" in the last 168 hours. BNP: Invalid input(s): "POCBNP" CBG: No results for input(s): "GLUCAP" in the last 168 hours. D-Dimer No results for input(s): "DDIMER" in the last 72 hours. Hgb A1c No results for input(s): "HGBA1C" in the last 72 hours. Lipid Profile No results for input(s): "CHOL", "HDL", "LDLCALC", "TRIG", "CHOLHDL", "LDLDIRECT" in the last 72 hours. Thyroid function studies No results for input(s): "TSH", "T4TOTAL", "T3FREE", "THYROIDAB" in the last 72 hours.  Invalid input(s): "FREET3" Anemia work up No results for input(s): "VITAMINB12", "FOLATE", "FERRITIN", "TIBC", "IRON", "RETICCTPCT" in the last 72 hours. Urinalysis    Component Value Date/Time   COLORURINE YELLOW (A) 02/11/2022 0258   APPEARANCEUR Cloudy (A) 10/01/2022 1414   LABSPEC 1.028 02/11/2022 0258   LABSPEC 1.006 12/16/2012 0322   PHURINE 5.0 02/11/2022 0258   GLUCOSEU Negative 10/01/2022 1414   GLUCOSEU Negative 12/16/2012 0322   HGBUR NEGATIVE 02/11/2022 0258  BILIRUBINUR Negative 10/01/2022 1414   BILIRUBINUR Negative 12/16/2012 0322   KETONESUR 20 (A) 02/11/2022 0258   PROTEINUR 3+ (A) 10/01/2022 1414   PROTEINUR 30 (A) 02/11/2022 0258   UROBILINOGEN 0.2 03/15/2008 1651   NITRITE Positive (A) 10/01/2022 1414   NITRITE NEGATIVE 02/11/2022 0258   LEUKOCYTESUR 2+ (A) 10/01/2022 1414   LEUKOCYTESUR NEGATIVE 02/11/2022 0258   LEUKOCYTESUR Trace 12/16/2012 0322   Sepsis Labs Recent Labs  Lab 10/31/22 0456 11/01/22 0635 11/02/22 0655 11/03/22 0302  WBC 13.3* 12.1* 10.5 9.2   Microbiology Recent Results (from the past 240 hour(s))  Resp panel by RT-PCR (RSV, Flu  A&B, Covid) Anterior Nasal Swab     Status: None   Collection Time: 10/30/22 12:08 PM   Specimen: Anterior Nasal Swab  Result Value Ref Range Status   SARS Coronavirus 2 by RT PCR NEGATIVE NEGATIVE Final    Comment: (NOTE) SARS-CoV-2 target nucleic acids are NOT DETECTED.  The SARS-CoV-2 RNA is generally detectable in upper respiratory specimens during the acute phase of infection. The lowest concentration of SARS-CoV-2 viral copies this assay can detect is 138 copies/mL. A negative result does not preclude SARS-Cov-2 infection and should not be used as the sole basis for treatment or other patient management decisions. A negative result may occur with  improper specimen collection/handling, submission of specimen other than nasopharyngeal swab, presence of viral mutation(s) within the areas targeted by this assay, and inadequate number of viral copies(<138 copies/mL). A negative result must be combined with clinical observations, patient history, and epidemiological information. The expected result is Negative.  Fact Sheet for Patients:  BloggerCourse.com  Fact Sheet for Healthcare Providers:  SeriousBroker.it  This test is no t yet approved or cleared by the [place] FDA and  has been authorized for detection and/or diagnosis of SARS-CoV-2 by FDA under an Emergency Use Authorization (EUA). This EUA will remain  in effect (meaning this test can be used) for the duration of the COVID-19 declaration under Section 564(b)(1) of the Act, 21 U.S.C.section 360bbb-3(b)(1), unless the authorization is terminated  or revoked sooner.       Influenza A by PCR NEGATIVE NEGATIVE Final   Influenza B by PCR NEGATIVE NEGATIVE Final    Comment: (NOTE) The Xpert Xpress SARS-CoV-2/FLU/RSV plus assay is intended as an aid in the diagnosis of influenza from Nasopharyngeal swab specimens and should not be used as a sole basis for treatment.  Nasal washings and aspirates are unacceptable for Xpert Xpress SARS-CoV-2/FLU/RSV testing.  Fact Sheet for Patients: BloggerCourse.com  Fact Sheet for Healthcare Providers: SeriousBroker.it  This test is not yet approved or cleared by the [place] FDA and has been authorized for detection and/or diagnosis of SARS-CoV-2 by FDA under an Emergency Use Authorization (EUA). This EUA will remain in effect (meaning this test can be used) for the duration of the COVID-19 declaration under Section 564(b)(1) of the Act, 21 U.S.C. section 360bbb-3(b)(1), unless the authorization is terminated or revoked.     Resp Syncytial Virus by PCR NEGATIVE NEGATIVE Final    Comment: (NOTE) Fact Sheet for Patients: BloggerCourse.com  Fact Sheet for Healthcare Providers: SeriousBroker.it  This test is not yet approved or cleared by the [place] FDA and has been authorized for detection and/or diagnosis of SARS-CoV-2 by FDA under an Emergency Use Authorization (EUA). This EUA will remain in effect (meaning this test can be used) for the duration of the COVID-19 declaration under Section 564(b)(1) of the Act, 21 U.S.C. section 360bbb-3(b)(1), unless  the authorization is terminated or revoked.  Performed at [facility], [address] [place], [place] [address]   Blood Culture (routine x 2)     Status: None (Preliminary result)   Collection Time: 10/30/22 12:08 PM   Specimen: BLOOD  Result Value Ref Range Status   Specimen Description BLOOD BLOOD LEFT ARM  Final   Special Requests   Final    BOTTLES DRAWN AEROBIC AND ANAEROBIC Blood Culture adequate volume   Culture   Final    NO GROWTH 4 DAYS Performed at [facility], [address] [place], [place] [address]    Report Status PENDING  Incomplete  Blood Culture (routine x 2)     Status: None (Preliminary result)    Collection Time: 10/30/22 12:08 PM   Specimen: BLOOD  Result Value Ref Range Status   Specimen Description BLOOD BLOOD RIGHT ARM  Final   Special Requests   Final    BOTTLES DRAWN AEROBIC AND ANAEROBIC Blood Culture adequate volume   Culture   Final    NO GROWTH 4 DAYS Performed at [facility], [address] [place], [place] [address]    Report Status PENDING  Incomplete   Imaging ECHOCARDIOGRAM COMPLETE  Result Date: 11/02/2022    ECHOCARDIOGRAM REPORT   Patient Name:   BELTON ALTAMIRA [facility] Date of Exam: 11/02/2022 Medical Rec #:  [id number]      Height:       70.0 in Accession #:    [id number]     Weight:       130.3 lb Date of Birth:  1940-11-15      BSA:          1.740 m Patient Age:    82 years       BP:           128/77 mmHg Patient Gender: M              HR:           82 bpm. Exam Location:  [facility] Procedure: Cardiac Doppler, Color Doppler and Strain Analysis Indications:     Pulmonary Embolus I26.09  History:         Patient has no prior history of Echocardiogram examinations.                  CAD and Previous Myocardial Infarction; Risk                  Factors:Hypertension. S/P CABG x5.  Sonographer:     Cristela Blue Referring Phys:  [id number] Emeline General Diagnosing Phys: Lorine Bears MD  Sonographer Comments: Global longitudinal strain was attempted. IMPRESSIONS  1. Left ventricular ejection fraction, by estimation, is 30 to 35%. The left ventricle has moderately decreased function. The left ventricle demonstrates regional wall motion abnormalities (see scoring diagram/findings for description). There is moderate asymmetric left ventricular hypertrophy of the basal-septal segment. Left ventricular diastolic parameters are indeterminate. There is akinesis of the left ventricular, entire anteroseptal wall, anterior wall and apical segment. The average left  ventricular global longitudinal strain is -9.1 %. The global longitudinal strain is abnormal.  2. Right ventricular systolic function  is normal. The right ventricular size is normal. Tricuspid regurgitation signal is inadequate for assessing PA pressure.  3. The mitral valve is normal in structure. Mild mitral valve regurgitation. No evidence of mitral stenosis.  4. The aortic valve is normal in structure. Aortic valve regurgitation is not visualized. Aortic valve  sclerosis is present, with no evidence of aortic valve stenosis. FINDINGS  Left Ventricle: Left ventricular ejection fraction, by estimation, is 30 to 35%. The left ventricle has moderately decreased function. The left ventricle demonstrates regional wall motion abnormalities. The average left ventricular global longitudinal strain is -9.1 %. The global longitudinal strain is abnormal. The left ventricular internal cavity size was normal in size. There is moderate asymmetric left ventricular hypertrophy of the basal-septal segment. Left ventricular diastolic parameters are indeterminate. Right Ventricle: The right ventricular size is normal. No increase in right ventricular wall thickness. Right ventricular systolic function is normal. Tricuspid regurgitation signal is inadequate for assessing PA pressure. Left Atrium: Left atrial size was normal in size. Right Atrium: Right atrial size was normal in size. Pericardium: There is no evidence of pericardial effusion. Mitral Valve: The mitral valve is normal in structure. Mild mitral valve regurgitation. No evidence of mitral valve stenosis. Tricuspid Valve: The tricuspid valve is normal in structure. Tricuspid valve regurgitation is not demonstrated. No evidence of tricuspid stenosis. Aortic Valve: The aortic valve is normal in structure. Aortic valve regurgitation is not visualized. Aortic valve sclerosis is present, with no evidence of aortic valve stenosis. Aortic valve mean gradient measures 3.5 mmHg. Aortic valve peak gradient measures 5.5 mmHg. Aortic valve area, by VTI measures 2.63 cm. Pulmonic Valve: The pulmonic valve was normal  in structure. Pulmonic valve regurgitation is not visualized. No evidence of pulmonic stenosis. Aorta: The aortic root is normal in size and structure. Venous: The inferior vena cava was not well visualized. IAS/Shunts: No atrial level shunt detected by color flow Doppler.  LEFT VENTRICLE PLAX 2D LVIDd:         5.30 cm      Diastology LVIDs:         4.00 cm      LV e' medial:    5.98 cm/s LV PW:         0.70 cm      LV E/e' medial:  9.3 LV IVS:        1.40 cm      LV e' lateral:   8.59 cm/s LVOT diam:     2.00 cm      LV E/e' lateral: 6.5 LV SV:         45 LV SV Index:   26           2D Longitudinal Strain LVOT Area:     3.14 cm     2D Strain GLS Avg:     -9.1 %  LV Volumes (MOD) LV vol d, MOD A2C: 138.0 ml LV vol d, MOD A4C: 107.0 ml LV vol s, MOD A2C: 101.0 ml LV vol s, MOD A4C: 74.2 ml LV SV MOD A2C:     37.0 ml LV SV MOD A4C:     107.0 ml LV SV MOD BP:      33.6 ml RIGHT VENTRICLE RV Basal diam:  4.50 cm RV Mid diam:    3.80 cm LEFT ATRIUM           Index        RIGHT ATRIUM           Index LA diam:      4.00 cm 2.30 cm/m   RA Area:     12.60 cm LA Vol (A2C): 48.3 ml 27.76 ml/m  RA Volume:   23.00 ml  13.22 ml/m LA Vol (A4C): 11.2 ml 6.44 ml/m  AORTIC VALVE AV Area (Vmax):  2.68 cm AV Area (Vmean):   2.37 cm AV Area (VTI):     2.63 cm AV Vmax:           117.00 cm/s AV Vmean:          85.650 cm/s AV VTI:            0.171 m AV Peak Grad:      5.5 mmHg AV Mean Grad:      3.5 mmHg LVOT Vmax:         99.80 cm/s LVOT Vmean:        64.600 cm/s LVOT VTI:          0.143 m LVOT/AV VTI ratio: 0.84  AORTA Ao Root diam: 3.10 cm MITRAL VALVE                TRICUSPID VALVE MV Area (PHT): 11.16 cm    TR Peak grad:   10.8 mmHg MV Decel Time: 68 msec      TR Vmax:        164.00 cm/s MV E velocity: 55.50 cm/s MV A velocity: 110.00 cm/s  SHUNTS MV E/A ratio:  0.50         Systemic VTI:  0.14 m                             Systemic Diam: 2.00 cm Lorine Bears MD Electronically signed by Lorine Bears MD Signature  Date/Time: 11/02/2022/11:01:39 AM    Final       Time coordinating discharge: over 30 minutes  SIGNED:  Sunnie Nielsen DO [facility]

## 2022-11-04 LAB — CULTURE, BLOOD (ROUTINE X 2)
Culture: NO GROWTH
Culture: NO GROWTH
Special Requests: ADEQUATE
Special Requests: ADEQUATE

## 2022-11-24 NOTE — Progress Notes (Unsigned)
11/25/2022 8:20 PM   Nicholas Alvarez October 17, 1940 [id number]  Referring provider: Kandyce Rud, MD [phone] [address] [facility] [place],  [place] [address]  Urological history: 1. Urinary retention -episode 2014 -during hospitalization for pneumonia (12/2019)  -[facility] (10/2022)  2. Elevated PSA -prostate biopsy (2011) negative -patholgy from [facility] ([id number]) - negative for prostate cancer   3. BPH with LU TS -tamsulosin 0.4 mg daily and finasteride 5 mg daily -[facility] (10/2022)   No chief complaint on file.  HPI: Nicholas Alvarez is a 82 y.o. male who presents today for follow up with his wife, [name].    Previous records reviewed.  Recently diagnosed with CHF.   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    Chronic anemia    COVID-19    Depression    Dyslipidemia    Dyspnea    ED (erectile dysfunction)    Elevated PSA    Falls    Gross hematuria    History of methicillin resistant staphylococcus aureus (MRSA) 02/2022   + PCR nasal swab   Hx of hypotension    was placed on midodrine and then taken off   Hypertension    Interstitial lung disease (HCC)    Ischemic cardiomyopathy    with left ventricular ejection fraction of 35%   Left ventricular aneurysm    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   Myocardial infarction (HCC) 2009   Pneumonia    Protein-calorie malnutrition, severe (HCC)    S/P CABG x 5 2010   Sepsis due to pneumonia (HCC)    Sjogren's syndrome (HCC)    lung involvment    Surgical History: Past Surgical History:  Procedure Laterality Date   BRONCHIAL WASHINGS N/A 01/16/2022   Procedure:  BRONCHIAL WASHINGS;  Surgeon: Vida Rigger, MD;  Location: [facility] ORS;  Service: Thoracic;  Laterality: N/A;   CARDIAC CATHETERIZATION  03/2008   COLONOSCOPY     CORONARY ARTERY BYPASS GRAFT  03/12/2008   HOLEP-LASER ENUCLEATION OF THE PROSTATE WITH MORCELLATION N/A 10/12/2022   Procedure: HOLEP-LASER ENUCLEATION OF THE PROSTATE WITH MORCELLATION;  Surgeon: Vanna Scotland, MD;  Location: [facility] ORS;  Service: Urology;  Laterality: N/A;   PROSTATE BIOPSY  11/2009   RIGID BRONCHOSCOPY N/A 01/16/2022   Procedure: RIGID BRONCHOSCOPY;  Surgeon: Vida Rigger, MD;  Location: [facility] ORS;  Service: Thoracic;  Laterality: N/A;    Home Medications:  Allergies as of 11/25/2022       Reactions   Ace Inhibitors Cough        Medication List        Accurate as of November 24, 2022  8:20 PM. If you have any questions, ask your nurse or doctor.          albuterol 108 (90 Base) MCG/ACT inhaler Commonly known as: VENTOLIN HFA Inhale 1-2 puffs into the lungs every 6 (six) hours as needed for wheezing or shortness of breath.   apixaban 5 MG Tabs tablet Commonly known as: ELIQUIS Take 2 tablets (10 mg total) by mouth 2 (two) times daily for 6 days, THEN 1 tablet (5 mg total) 2 (  two) times daily. Start taking on: November 03, 2022   azithromycin 500 MG tablet Commonly known as: ZITHROMAX Take 500 mg by mouth 3 (three) times a week.   clopidogrel 75 MG tablet Commonly known as: PLAVIX Take 75 mg by mouth daily.   ethambutol 400 MG tablet Commonly known as: MYAMBUTOL Take 1,800 mg by mouth 3 (three) times a week. M, W, F-takes 4.5 tablets   feeding supplement Liqd Take 237 mLs by mouth 3 (three) times daily between meals.   finasteride 5 MG tablet Commonly known as: PROSCAR Take 5 mg by mouth every morning.   furosemide 20 MG tablet Commonly known as: LASIX Take 1 tablet (20 mg total) by mouth ONCE OR TWICE daily (total daily dose 20-40 mg) as needed for up to 3 days for increased leg  swelling, shortness of breath with lying down on your back, weight gain 5+ lbs over 1-2 days. Seek medical care if these symptoms are not improving with increased dose.   guaiFENesin 600 MG 12 hr tablet Commonly known as: MUCINEX Take 1 tablet (600 mg total) by mouth 2 (two) times daily.   HYDROcodone-acetaminophen 5-325 MG tablet Commonly known as: NORCO/VICODIN Take 1-2 tablets by mouth every 6 (six) hours as needed for moderate pain.   mirtazapine 15 MG tablet Commonly known as: REMERON Take 1 tablet by mouth at bedtime.   multivitamin with minerals Tabs tablet Take 1 tablet by mouth daily.   oxybutynin 5 MG tablet Commonly known as: DITROPAN Take 1 tablet (5 mg total) by mouth every 8 (eight) hours as needed for bladder spasms.   predniSONE 10 MG tablet Commonly known as: DELTASONE Take 5 tablets (50 mg total) by mouth daily for 7 days, THEN 4.5 tablets (45 mg total) daily for 7 days, THEN 4 tablets (40 mg total) daily for 7 days, THEN 3.5 tablets (35 mg total) daily for 7 days, THEN 3 tablets (30 mg total) daily for 7 days, THEN 2.5 tablets (25 mg total) daily for 7 days, THEN 2 tablets (20 mg total) daily for 7 days, THEN 1.5 tablets (15 mg total) daily for 7 days, THEN 1 tablet (10 mg total) daily for 7 days, THEN 0.5 tablets (5 mg total) daily for 7 days. Start taking on: November 03, 2022   rifampin 300 MG capsule Commonly known as: RIFADIN Take 600 mg by mouth 3 (three) times a week. M, W, F   simvastatin 40 MG tablet Commonly known as: ZOCOR Take 40 mg by mouth every morning.   tamsulosin 0.4 MG Caps capsule Commonly known as: FLOMAX Take 0.4 mg by mouth daily after breakfast.        Allergies:  Allergies  Allergen Reactions   Ace Inhibitors Cough    Family History: Family History  Problem Relation Age of Onset   Coronary artery disease Other    Heart attack Sister 6   Heart attack Maternal Grandfather 40   Heart attack Paternal Grandfather 74   COPD  Mother    Pneumonia Father    Cancer Sister     Social History:  reports that he has quit smoking. His smoking use included cigarettes. He has been exposed to tobacco smoke. He has never used smokeless tobacco. He reports current alcohol use of about 12.0 standard drinks of alcohol per week. He reports that he does not use drugs.  ROS: Pertinent ROS in HPI  Physical Exam: There were no vitals taken for this visit.  Constitutional:  Well nourished.  Alert and oriented, No acute distress. HEENT: [place] AT, moist mucus membranes.  Trachea midline, no masses. Cardiovascular: No clubbing, cyanosis, or edema. Respiratory: Normal respiratory effort, no increased work of breathing. GI: Abdomen is soft, non tender, non distended, no abdominal masses. Liver and spleen not palpable.  No hernias appreciated.  Stool sample for occult testing is not indicated.   GU: No CVA tenderness.  No bladder fullness or masses.  Patient with circumcised/uncircumcised phallus. ***Foreskin easily retracted***  Urethral meatus is patent.  No penile discharge. No penile lesions or rashes. Scrotum without lesions, cysts, rashes and/or edema.  Testicles are located scrotally bilaterally. No masses are appreciated in the testicles. Left and right epididymis are normal. Rectal: Patient with  normal sphincter tone. Anus and perineum without scarring or rashes. No rectal masses are appreciated. Prostate is approximately *** grams, *** nodules are appreciated. Seminal vesicles are normal. Skin: No rashes, bruises or suspicious lesions. Lymph: No cervical or inguinal adenopathy. Neurologic: Grossly intact, no focal deficits, moving all 4 extremities. Psychiatric: Normal mood and affect.   Laboratory Data: CBC    Component Value Date/Time   WBC 9.2 11/03/2022 0302   RBC 3.09 (L) 11/03/2022 0302   HGB 8.0 (L) 11/03/2022 0302   HGB 13.5 10/24/2012 1546   HCT 25.6 (L) 11/03/2022 0302   HCT 39.7 (L) 10/24/2012 1546   PLT 478 (H)  11/03/2022 0302   PLT 194 10/24/2012 1546   MCV 82.8 11/03/2022 0302   MCV 89 10/24/2012 1546   MCH 25.9 (L) 11/03/2022 0302   MCHC 31.3 11/03/2022 0302   RDW 15.3 11/03/2022 0302   RDW 13.7 10/24/2012 1546   LYMPHSABS 0.7 10/30/2022 1208   MONOABS 1.1 (H) 10/30/2022 1208   EOSABS 0.1 10/30/2022 1208   BASOSABS 0.1 10/30/2022 1208       Latest Ref Rng & Units 11/02/2022    6:55 AM 10/31/2022    4:56 AM 10/30/2022   12:08 PM  BMP  Glucose 70 - 99 mg/dL 98  99  161   BUN 8 - 23 mg/dL 18  22  28    Creatinine 0.61 - 1.24 mg/dL 0.96  0.45  4.09   Sodium 135 - 145 mmol/L 139  139  137   Potassium 3.5 - 5.1 mmol/L 3.8  3.5  3.9   Chloride 98 - 111 mmol/L 108  108  104   CO2 22 - 32 mmol/L 24  24  23    Calcium 8.9 - 10.3 mg/dL 8.9  8.5  9.4   I have reviewed the labs.  See HPI.      Pertinent Imaging: ***  Assessment & Plan:    1. Urinary retention -Foley placed today -KUB did not demonstrate any constipation -CT scan is scheduled for tomorrow by PCP for further evaluation of weight loss -will need to schedule cysto for further evaluation of retention -Explained that the cystoscopy is a safe and common diagnostic test performed by one of our physicians in the office.  It consist of using a thin, lighted tube to look directly inside the bladder, prostate and urethra to evaluate the anatomy.  The procedure is brief, typically taking about 5 minutes. -This will unable [place] to assess bladder health, diagnose and enlarged prostate, assess which BPH procedure may be most appropriate and rule out other bladder conditions (stricture disease, stones, cancer, etc.)  -Advised the patient that there are no restrictions to eating or drinking prior to the cystoscopy -They can continue to take all  of their medications as prescribed -They can drive themselves to and from the appointment -I explained that during the procedure, the area around the urethra will be cleansed thoroughly, topical anesthetic  will be applied to numb your urethra, the thin tube is then gently inserted through the urethra into your bladder while fluid flows through the tube to the bladder to enable better visualization -I explained the procedure is usually not painful, however there may be some discomfort (pinching feeling), and they may feel an urge to urinate, coolness or fullness in the bladder and then the cystoscope is removed -After the cystoscopy, I advised them that they may experience urinary frequency, and infection, hematuria, dysuria which will resolve within 24 to 48 hours -Reviewed red flag signs (fever, bright red blood or blood clots in the urine, abdominal pain or difficulty urinating) and to contact the office immediately or seek treatment in the ED if they should experience any of these -The physician will discuss the results of the cystoscopy at the time of the procedure  -schedule cysto   2. BPH with LU TS -continue tamsulosin 0.4 mg and finasteride 5 mg daily   No follow-ups on file.  These notes generated with voice recognition software. I apologize for typographical errors.  Cloretta Ned  [facility] [address]  [address] [place], [place] [address] [phone]

## 2022-11-25 ENCOUNTER — Ambulatory Visit: Payer: Medicare Other | Admitting: Urology

## 2022-11-25 ENCOUNTER — Encounter: Payer: Self-pay | Admitting: Urology

## 2022-11-25 VITALS — BP 97/62 | HR 120

## 2022-11-25 DIAGNOSIS — R339 Retention of urine, unspecified: Secondary | ICD-10-CM

## 2022-11-25 DIAGNOSIS — N138 Other obstructive and reflux uropathy: Secondary | ICD-10-CM

## 2022-11-25 DIAGNOSIS — Z09 Encounter for follow-up examination after completed treatment for conditions other than malignant neoplasm: Secondary | ICD-10-CM

## 2022-11-25 DIAGNOSIS — Z87438 Personal history of other diseases of male genital organs: Secondary | ICD-10-CM

## 2022-11-25 LAB — BLADDER SCAN AMB NON-IMAGING: Scan Result: 0

## 2022-11-25 MED ORDER — GEMTESA 75 MG PO TABS
75.0000 mg | ORAL_TABLET | Freq: Every day | ORAL | Status: DC
Start: 2022-11-25 — End: 2022-12-28

## 2022-11-25 NOTE — Patient Instructions (Signed)
-  Stop the tamsulosin 0.4 mg (Flomax)  -Continue the finasteride 5 mg (Proscar)  -take the Gemtesa 75 mg daily, samples

## 2022-12-09 ENCOUNTER — Other Ambulatory Visit: Payer: Self-pay | Admitting: Infectious Diseases

## 2022-12-09 DIAGNOSIS — E46 Unspecified protein-calorie malnutrition: Secondary | ICD-10-CM

## 2022-12-09 DIAGNOSIS — I2699 Other pulmonary embolism without acute cor pulmonale: Secondary | ICD-10-CM

## 2022-12-09 DIAGNOSIS — J849 Interstitial pulmonary disease, unspecified: Secondary | ICD-10-CM

## 2022-12-09 DIAGNOSIS — A310 Pulmonary mycobacterial infection: Secondary | ICD-10-CM

## 2022-12-23 ENCOUNTER — Ambulatory Visit
Admission: RE | Admit: 2022-12-23 | Discharge: 2022-12-23 | Disposition: A | Discharge status: Home / Self Care | Payer: Medicare Other | Source: Ambulatory Visit | Attending: Infectious Diseases | Admitting: Infectious Diseases

## 2022-12-23 DIAGNOSIS — I2699 Other pulmonary embolism without acute cor pulmonale: Secondary | ICD-10-CM | POA: Insufficient documentation

## 2022-12-23 DIAGNOSIS — J849 Interstitial pulmonary disease, unspecified: Secondary | ICD-10-CM | POA: Insufficient documentation

## 2022-12-23 DIAGNOSIS — A310 Pulmonary mycobacterial infection: Secondary | ICD-10-CM | POA: Insufficient documentation

## 2022-12-23 DIAGNOSIS — E46 Unspecified protein-calorie malnutrition: Secondary | ICD-10-CM | POA: Diagnosis present

## 2022-12-28 ENCOUNTER — Telehealth: Payer: Self-pay | Admitting: Urology

## 2022-12-28 MED ORDER — GEMTESA 75 MG PO TABS: 75.0000 mg | ORAL_TABLET | Freq: Every day | ORAL | 11 refills | Status: DC

## 2022-12-28 NOTE — Telephone Encounter (Signed)
rx sent to pharmacy by e-script Could not leave a message mailbox is full

## 2022-12-28 NOTE — Telephone Encounter (Signed)
Pt's wife LMOM that Leslye Peer was working well for him.  They would like a new RX sent to [facility] in [place].

## 2023-01-02 NOTE — Progress Notes (Unsigned)
01/06/2023 9:19 AM   Nicholas Alvarez 1940/10/17 [id number]  Referring provider: Kandyce Rud, MD [phone] [address] [facility] [place],  [place] [address]  Urological history: 1. Urinary retention -episode 2014 -during hospitalization for pneumonia (12/2019)  -HoLEP (10/2022)  2. Elevated PSA -prostate biopsy (2011) negative -patholgy from HoLEP (401027) - negative for prostate cancer   3. BPH with LU TS -tamsulosin 0.4 mg daily and finasteride 5 mg daily -HoLEP (10/2022)   No chief complaint on file.  HPI: Nicholas Alvarez is a 82 y.o. male who presents today for follow up with his wife, [name].    Previous records reviewed.  Recently diagnosed with CHF and IDL.   At his visit on 11/25/2022, I PSS 18/6.  PVR 0 mL.  He continues to have issues with urinary leakage.  Patient denies any modifying or aggravating factors.  Patient denies any recent UTI's, gross hematuria, dysuria or suprapubic/flank pain.  Patient denies any fevers, chills, nausea or vomiting.   He was given Gemtesa 75 mg daily samples.       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    Chronic anemia    COVID-19    Depression    Dyslipidemia    Dyspnea    ED (erectile dysfunction)    Elevated PSA    Falls    Gross hematuria    History of methicillin resistant staphylococcus aureus (MRSA) 02/2022   + PCR nasal swab   Hx of hypotension    was placed on midodrine and then taken off   Hypertension    Interstitial lung disease (HCC)    Ischemic cardiomyopathy    with left ventricular ejection fraction of 35%   Left ventricular aneurysm    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   Myocardial infarction (HCC) 2009   Pneumonia    Protein-calorie malnutrition, severe (HCC)    S/P CABG x 5 2010   Sepsis due to pneumonia (HCC)    Sjogren's syndrome (HCC)    lung involvment    Surgical History: Past Surgical History:  Procedure Laterality Date   BRONCHIAL WASHINGS N/A 01/16/2022   Procedure: BRONCHIAL WASHINGS;  Surgeon: Vida Rigger, MD;  Location: [facility] ORS;  Service: Thoracic;  Laterality: N/A;   CARDIAC CATHETERIZATION  03/2008   COLONOSCOPY     CORONARY ARTERY BYPASS GRAFT  03/12/2008   HOLEP-LASER ENUCLEATION OF THE PROSTATE WITH MORCELLATION N/A 10/12/2022   Procedure: HOLEP-LASER ENUCLEATION OF THE PROSTATE WITH MORCELLATION;  Surgeon: Vanna Scotland, MD;  Location: [facility] ORS;  Service: Urology;  Laterality: N/A;   PROSTATE BIOPSY  11/2009   RIGID BRONCHOSCOPY N/A 01/16/2022   Procedure: RIGID BRONCHOSCOPY;  Surgeon: Vida Rigger, MD;  Location: [facility] ORS;  Service: Thoracic;  Laterality: N/A;    Home Medications:  Allergies as of 01/06/2023       Reactions   Ace Inhibitors Cough        Medication List        Accurate as of January 02, 2023  9:19 AM. If you have any questions, ask your nurse or doctor.          albuterol 108 (90  Base) MCG/ACT inhaler Commonly known as: VENTOLIN HFA Inhale 1-2 puffs into the lungs every 6 (six) hours as needed for wheezing or shortness of breath.   apixaban 5 MG Tabs tablet Commonly known as: ELIQUIS Take 2 tablets (10 mg total) by mouth 2 (two) times daily for 6 days, THEN 1 tablet (5 mg total) 2 (two) times daily. Start taking on: November 03, 2022   DULoxetine 60 MG capsule Commonly known as: CYMBALTA Take by mouth.   ethambutol 400 MG tablet Commonly known as: MYAMBUTOL Take 1,800 mg by mouth 3 (three) times a week. M, W, F-takes 4.5 tablets   feeding supplement Liqd Take 237 mLs by mouth 3 (three) times daily between meals.   finasteride 5 MG tablet Commonly known as:  PROSCAR Take 5 mg by mouth every morning.   furosemide 20 MG tablet Commonly known as: LASIX Take 1 tablet (20 mg total) by mouth ONCE OR TWICE daily (total daily dose 20-40 mg) as needed for up to 3 days for increased leg swelling, shortness of breath with lying down on your back, weight gain 5+ lbs over 1-2 days. Seek medical care if these symptoms are not improving with increased dose.   Gemtesa 75 MG Tabs Generic drug: Vibegron Take 1 tablet (75 mg total) by mouth daily.   guaiFENesin 600 MG 12 hr tablet Commonly known as: MUCINEX Take 1 tablet (600 mg total) by mouth 2 (two) times daily.   HYDROcodone-acetaminophen 5-325 MG tablet Commonly known as: NORCO/VICODIN Take 1-2 tablets by mouth every 6 (six) hours as needed for moderate pain.   mirtazapine 15 MG tablet Commonly known as: REMERON Take 1 tablet by mouth at bedtime.   multivitamin with minerals Tabs tablet Take 1 tablet by mouth daily.   oxybutynin 5 MG tablet Commonly known as: DITROPAN Take 1 tablet (5 mg total) by mouth every 8 (eight) hours as needed for bladder spasms.   predniSONE 10 MG tablet Commonly known as: DELTASONE Take 5 tablets (50 mg total) by mouth daily for 7 days, THEN 4.5 tablets (45 mg total) daily for 7 days, THEN 4 tablets (40 mg total) daily for 7 days, THEN 3.5 tablets (35 mg total) daily for 7 days, THEN 3 tablets (30 mg total) daily for 7 days, THEN 2.5 tablets (25 mg total) daily for 7 days, THEN 2 tablets (20 mg total) daily for 7 days, THEN 1.5 tablets (15 mg total) daily for 7 days, THEN 1 tablet (10 mg total) daily for 7 days, THEN 0.5 tablets (5 mg total) daily for 7 days. Start taking on: November 03, 2022   rifampin 300 MG capsule Commonly known as: RIFADIN Take 600 mg by mouth 3 (three) times a week. M, W, F   simvastatin 40 MG tablet Commonly known as: ZOCOR Take 40 mg by mouth every morning.   tamsulosin 0.4 MG Caps capsule Commonly known as: FLOMAX Take 0.4 mg by mouth  daily after breakfast.        Allergies:  Allergies  Allergen Reactions   Ace Inhibitors Cough    Family History: Family History  Problem Relation Age of Onset   Coronary artery disease Other    Heart attack Sister 49   Heart attack Maternal Grandfather 40   Heart attack Paternal Grandfather 74   COPD Mother    Pneumonia Father    Cancer Sister     Social History:  reports that he has quit smoking. His smoking use included cigarettes. He has been  exposed to tobacco smoke. He has never used smokeless tobacco. He reports current alcohol use of about 12.0 standard drinks of alcohol per week. He reports that he does not use drugs.  ROS: Pertinent ROS in HPI  Physical Exam: There were no vitals taken for this visit.  Constitutional:  Well nourished. Alert and oriented, No acute distress. HEENT: [place] AT, moist mucus membranes.  Trachea midline, no masses. Cardiovascular: No clubbing, cyanosis, or edema. Respiratory: Normal respiratory effort, no increased work of breathing. GI: Abdomen is soft, non tender, non distended, no abdominal masses. Liver and spleen not palpable.  No hernias appreciated.  Stool sample for occult testing is not indicated.   GU: No CVA tenderness.  No bladder fullness or masses.  Patient with circumcised/uncircumcised phallus. ***Foreskin easily retracted***  Urethral meatus is patent.  No penile discharge. No penile lesions or rashes. Scrotum without lesions, cysts, rashes and/or edema.  Testicles are located scrotally bilaterally. No masses are appreciated in the testicles. Left and right epididymis are normal. Rectal: Patient with  normal sphincter tone. Anus and perineum without scarring or rashes. No rectal masses are appreciated. Prostate is approximately *** grams, *** nodules are appreciated. Seminal vesicles are normal. Skin: No rashes, bruises or suspicious lesions. Lymph: No cervical or inguinal adenopathy. Neurologic: Grossly intact, no focal  deficits, moving all 4 extremities. Psychiatric: Normal mood and affect.   Laboratory Data: CBC w/auto Differential (5 Part) Order: [id number] Component Ref Range & Units 3 wk ago  WBC (White Blood Cell Count) 4.1 - 10.2 10^3/uL 12.2 High   RBC (Red Blood Cell Count) 4.69 - 6.13 10^6/uL 3.84 Low   Hemoglobin 14.1 - 18.1 gm/dL 40.9 Low   Hematocrit 81.1 - 52.0 % 33.7 Low   MCV (Mean Corpuscular Volume) 80.0 - 100.0 fl 87.8  MCH (Mean Corpuscular Hemoglobin) 27.0 - 31.2 pg 26.6 Low   MCHC (Mean Corpuscular Hemoglobin Concentration) 32.0 - 36.0 gm/dL 91.4 Low   Platelet Count 150 - 450 10^3/uL 376  RDW-CV (Red Cell Distribution Width) 11.6 - 14.8 % 18.0 High   MPV (Mean Platelet Volume) 9.4 - 12.4 fl 10.0  Neutrophils 1.50 - 7.80 10^3/uL 8.63 High   Lymphocytes 1.00 - 3.60 10^3/uL 2.08  Monocytes 0.00 - 1.50 10^3/uL 1.18  Eosinophils 0.00 - 0.55 10^3/uL 0.22  Basophils 0.00 - 0.09 10^3/uL 0.07  Neutrophil % 32.0 - 70.0 % 70.6 High   Lymphocyte % 10.0 - 50.0 % 17.0  Monocyte % 4.0 - 13.0 % 9.7  Eosinophil % 1.0 - 5.0 % 1.8  Basophil% 0.0 - 2.0 % 0.6  Immature Granulocyte % <=0.7 % 0.3  Immature Granulocyte Count <=0.06 10^3/L 0.04  Resulting Agency [facility]   Specimen Collected: 12/07/22 09:11   Performed by: [facility] Last Resulted: 12/07/22 10:49  Received From: [facility]  Result Received: 12/09/22 08:30   Comprehensive Metabolic Panel (CMP) Order: [id number] Component Ref Range & Units 3 wk ago  Glucose 70 - 110 mg/dL 80  Sodium 086 - 578 mmol/L 140  Potassium 3.6 - 5.1 mmol/L 4.0  Chloride 97 - 109 mmol/L 107  Carbon Dioxide (CO2) 22.0 - 32.0 mmol/L 27.3  Urea Nitrogen (BUN) 7 - 25 mg/dL 26 High   Creatinine 0.7 - 1.3 mg/dL 0.8  Glomerular Filtration Rate (eGFR) >60 mL/min/1.73sq m 89  Comment: CKD-EPI (2021) does not include patient's race in the calculation of eGFR.  Monitoring  changes of plasma creatinine and eGFR over  time is useful for monitoring kidney function.  Interpretive Ranges for eGFR (CKD-EPI 2021):  eGFR:       >60 mL/min/1.73 sq. m - Normal eGFR:       30-59 mL/min/1.73 sq. m - Moderately Decreased eGFR:       15-29 mL/min/1.73 sq. m  - Severely Decreased eGFR:       < 15 mL/min/1.73 sq. m  - Kidney Failure   Note: These eGFR calculations do not apply in acute situations when eGFR is changing rapidly or patients on dialysis.  Calcium 8.7 - 10.3 mg/dL 9.3  AST 8 - 39 U/L 10  ALT 6 - 57 U/L 5 Low   Alk Phos (alkaline Phosphatase) 34 - 104 U/L 47  Albumin 3.5 - 4.8 g/dL 3.1 Low   Bilirubin, Total 0.3 - 1.2 mg/dL 0.3  Protein, Total 6.1 - 7.9 g/dL 7.2  A/G Ratio 1.0 - 5.0 gm/dL 0.8 Low   Resulting Agency [facility]   Specimen Collected: 12/07/22 09:11   Performed by: [facility] Last Resulted: 12/07/22 12:01  Received From: [facility]  Result Received: 12/09/22 08:30  I have reviewed the labs.  See HPI.      Pertinent Imaging: ***   Assessment & Plan:    1. Urinary retention --resolved   2. BPH with LU TS -s/p HoLEP -discontinue tamsulosin 0.4 mg -continue finasteride 5 mg daily  -Explained that the urinary leakage may still be the result of the HoLEP procedure.  I advised that over the next several weeks he may see resolvent of the leakage, a decrease in the amount of leakage or no change at all.  We will have to wait and see what happens.  I have given him some samples of gemtesa 75 mg daily to take.    No follow-ups on file.  These notes generated with voice recognition software. I apologize for typographical errors.  Cloretta Ned  [facility] [address]  [address] [place], [place] [address] [phone]

## 2023-01-06 ENCOUNTER — Ambulatory Visit: Payer: Medicare Other | Admitting: Urology

## 2023-01-06 ENCOUNTER — Encounter: Payer: Self-pay | Admitting: Urology

## 2023-01-06 VITALS — BP 134/77 | HR 58 | Wt 139.0 lb

## 2023-01-06 DIAGNOSIS — Z87898 Personal history of other specified conditions: Secondary | ICD-10-CM

## 2023-01-06 DIAGNOSIS — N401 Enlarged prostate with lower urinary tract symptoms: Secondary | ICD-10-CM | POA: Diagnosis not present

## 2023-01-06 DIAGNOSIS — N3941 Urge incontinence: Secondary | ICD-10-CM

## 2023-01-06 LAB — BLADDER SCAN AMB NON-IMAGING: Scan Result: 0

## 2023-02-12 NOTE — Progress Notes (Unsigned)
02/16/2023 3:04 PM   Nicolette Bang D Maines Aug 06, 1940 [id number]  Referring provider: Kandyce Rud, MD [phone] [address] [facility] [place],  [place] [address]  Urological history: 1. Urinary retention -episode 2014 -during hospitalization for pneumonia (12/2019)  -HoLEP (10/2022)  2. Elevated PSA -prostate biopsy (2011) negative -patholgy from HoLEP (478295) - negative for prostate cancer   3. BPH with LU TS -tamsulosin 0.4 mg daily and finasteride 5 mg daily -HoLEP (10/2022)   Chief Complaint  Patient presents with   Follow-up   HPI: ELIA LARMER is a 82 y.o. male who presents today for follow up with his wife, [name].    Previous records reviewed.    At his visit on 11/25/2022, I PSS 18/6.  PVR 0 mL.  He continues to have issues with urinary leakage.  Patient denies any modifying or aggravating factors.  Patient denies any recent UTI's, gross hematuria, dysuria or suprapubic/flank pain.  Patient denies any fevers, chills, nausea or vomiting.   He was given Gemtesa 75 mg daily samples.    At his visit on 01/06/2023, I PSS 9/6.  PVR 0 mL.  He has had some mild improvement taking the Gemtesa 75 mg daily.  He states that he is not having incontinence at night.  The medication is cost prohibitive costing $100 for 30-day supply.  Patient denies any modifying or aggravating factors.  Patient denies any recent UTI's, gross hematuria, dysuria or suprapubic/flank pain.  Patient denies any fevers, chills, nausea or vomiting.  I had him discontinue the tamsulosin, continue the Gemtesa 75 mg daily and continue the finasteride and to engage in Kegel exercises.  I PSS 11/6  PVR 0 mL  On presentation his blood pressure was found to be 88/67 and tachycardia at 164.  We repeated the blood pressure and it was 77/59 tachycardia at 166.  He states that last night he was even wondering whether or not he should seek treatment in the ED as he was having a  racing heart and very short of breath.  He states he took 2 aspirins last night and went to bed.  We did not discuss his urinary issues today.    IPSS     Row Name 02/16/23 1400         International Prostate Symptom Score   How often have you had the sensation of not emptying your bladder? About half the time     How often have you had to urinate less than every two hours? Less than 1 in 5 times     How often have you found you stopped and started again several times when you urinated? Less than 1 in 5 times     How often have you found it difficult to postpone urination? Less than 1 in 5 times     How often have you had a weak urinary stream? About half the time     How often have you had to strain to start urination? Not at All     How many times did you typically get up at night to urinate? 2 Times     Total IPSS Score 11       Quality of Life due to urinary symptoms   If you were to spend the rest of your life with your urinary condition just the way it is now how would you feel about that? Terrible  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    Chronic anemia    COVID-19    Depression    Dyslipidemia    Dyspnea    ED (erectile dysfunction)    Elevated PSA    Falls    Gross hematuria    History of methicillin resistant staphylococcus aureus (MRSA) 02/2022   + PCR nasal swab   Hx of hypotension    was placed on midodrine and then taken off   Hypertension    Interstitial lung disease (HCC)    Ischemic cardiomyopathy    with left ventricular ejection fraction of 35%   Left ventricular aneurysm    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   Myocardial  infarction (HCC) 2009   Pneumonia    Protein-calorie malnutrition, severe (HCC)    S/P CABG x 5 2010   Sepsis due to pneumonia (HCC)    Sjogren's syndrome (HCC)    lung involvment    Surgical History: Past Surgical History:  Procedure Laterality Date   BRONCHIAL WASHINGS N/A 01/16/2022   Procedure: BRONCHIAL WASHINGS;  Surgeon: Vida Rigger, MD;  Location: [facility];  Service: Thoracic;  Laterality: N/A;   CARDIAC CATHETERIZATION  03/2008   COLONOSCOPY     CORONARY ARTERY BYPASS GRAFT  03/12/2008   HOLEP-LASER ENUCLEATION OF THE PROSTATE WITH MORCELLATION N/A 10/12/2022   Procedure: HOLEP-LASER ENUCLEATION OF THE PROSTATE WITH MORCELLATION;  Surgeon: Vanna Scotland, MD;  Location: [facility];  Service: Urology;  Laterality: N/A;   PROSTATE BIOPSY  11/2009   RIGID BRONCHOSCOPY N/A 01/16/2022   Procedure: RIGID BRONCHOSCOPY;  Surgeon: Vida Rigger, MD;  Location: [facility];  Service: Thoracic;  Laterality: N/A;    Home Medications:  Allergies as of 02/16/2023       Reactions   Ace Inhibitors Cough        Medication List        Accurate as of February 16, 2023  3:04 PM. If you have any questions, ask your nurse or doctor.          STOP taking these medications    furosemide 20 MG tablet Commonly known as: LASIX Stopped by: Michiel Cowboy   HYDROcodone-acetaminophen 5-325 MG tablet Commonly known as: NORCO/VICODIN Stopped by: Calob Baskette   tamsulosin 0.4 MG Caps capsule Commonly known as: FLOMAX Stopped by: Gwenna Fuston       TAKE these medications    albuterol 108 (90 Base) MCG/ACT inhaler Commonly known as: VENTOLIN HFA Inhale 1-2 puffs into the lungs every 6 (six) hours as needed for wheezing or shortness of breath.   apixaban 5 MG Tabs tablet Commonly known as: ELIQUIS Take 2 tablets (10 mg total) by mouth 2 (two) times daily for 6 days, THEN 1 tablet (5 mg total) 2 (two) times daily. Start taking on: November 03, 2022   budesonide 0.25  MG/2ML nebulizer solution Commonly known as: PULMICORT Take by nebulization daily.   DULoxetine 60 MG capsule Commonly known as: CYMBALTA Take by mouth.   ethambutol 400 MG tablet Commonly known as: MYAMBUTOL Take 1,800 mg by mouth 3 (three) times a week. M, W, F-takes 4.5 tablets   feeding supplement Liqd Take 237 mLs by mouth 3 (  three) times daily between meals.   finasteride 5 MG tablet Commonly known as: PROSCAR Take 5 mg by mouth every morning.   Gemtesa 75 MG Tabs Generic drug: Vibegron Take 1 tablet (75 mg total) by mouth daily.   guaiFENesin 600 MG 12 hr tablet Commonly known as: MUCINEX Take 1 tablet (600 mg total) by mouth 2 (two) times daily.   mirtazapine 15 MG tablet Commonly known as: REMERON Take 1 tablet by mouth at bedtime.   multivitamin with minerals Tabs tablet Take 1 tablet by mouth daily.   oxybutynin 5 MG tablet Commonly known as: DITROPAN Take 1 tablet (5 mg total) by mouth every 8 (eight) hours as needed for bladder spasms.   rifampin 300 MG capsule Commonly known as: RIFADIN Take 600 mg by mouth 3 (three) times a week. M, W, F   simvastatin 40 MG tablet Commonly known as: ZOCOR Take 40 mg by mouth every morning.        Allergies:  Allergies  Allergen Reactions   Ace Inhibitors Cough    Family History: Family History  Problem Relation Age of Onset   Coronary artery disease Other    Heart attack Sister 37   Heart attack Maternal Grandfather 40   Heart attack Paternal Grandfather 61   COPD Mother    Pneumonia Father    Cancer Sister     Social History:  reports that he has quit smoking. His smoking use included cigarettes. He has been exposed to tobacco smoke. He has never used smokeless tobacco. He reports current alcohol use of about 12.0 standard drinks of alcohol per week. He reports that he does not use drugs.  ROS: Pertinent ROS in HPI  Physical Exam: BP (!) 77/59   Pulse (!) 166   Constitutional:  Well  nourished. Alert and oriented, No acute distress. HEENT: [place] AT, moist mucus membranes.  Trachea midline Cardiovascular: No clubbing, cyanosis, or edema. Respiratory: Normal respiratory effort, no increased work of breathing. Neurologic: Grossly intact, no focal deficits, moving all 4 extremities. Psychiatric: Normal mood and affect.   Laboratory Data: CBC w/auto Differential (5 Part) Order: [id number] Component Ref Range & Units 4 wk ago  WBC (White Blood Cell Count) 4.1 - 10.2 10^3/uL 10  RBC (Red Blood Cell Count) 4.69 - 6.13 10^6/uL 3.7 Low   Hemoglobin 14.1 - 18.1 gm/dL 78.2 Low   Hematocrit 95.6 - 52.0 % 32.5 Low   MCV (Mean Corpuscular Volume) 80.0 - 100.0 fl 87.8  MCH (Mean Corpuscular Hemoglobin) 27.0 - 31.2 pg 27.3  MCHC (Mean Corpuscular Hemoglobin Concentration) 32.0 - 36.0 gm/dL 21.3 Low   Platelet Count 150 - 450 10^3/uL 342  RDW-CV (Red Cell Distribution Width) 11.6 - 14.8 % 19.6 High   MPV (Mean Platelet Volume) 9.4 - 12.4 fl 10.3  Neutrophils 1.50 - 7.80 10^3/uL 6.09  Lymphocytes 1.00 - 3.60 10^3/uL 2.43  Monocytes 0.00 - 1.50 10^3/uL 1.03  Eosinophils 0.00 - 0.55 10^3/uL 0.37  Basophils 0.00 - 0.09 10^3/uL 0.08  Neutrophil % 32.0 - 70.0 % 60.7  Lymphocyte % 10.0 - 50.0 % 24.3  Monocyte % 4.0 - 13.0 % 10.3  Eosinophil % 1.0 - 5.0 % 3.7  Basophil% 0.0 - 2.0 % 0.8  Immature Granulocyte % <=0.7 % 0.2  Immature Granulocyte Count <=0.06 10^3/L 0.02  Resulting Agency [facility]   Specimen Collected: 01/15/23 10:38   Performed by: [facility] Last Resulted: 01/15/23 15:03  [facility] From[facility]  Result Received: 01/28/23 10:39    Comprehensive Metabolic Panel (CMP) Order: [id number] Component Ref Range & Units 4 wk ago  Glucose 70 - 110 mg/dL 75  Sodium 409 - 811 mmol/L 140  Potassium 3.6 - 5.1 mmol/L 4.1  Chloride 97 - 109 mmol/L 105  Carbon Dioxide (CO2) 22.0 - 32.0 mmol/L 29.3   Urea Nitrogen (BUN) 7 - 25 mg/dL 37 High   Creatinine 0.7 - 1.3 mg/dL 1.2  Glomerular Filtration Rate (eGFR) >60 mL/min/1.73sq m 61  Comment: CKD-EPI (2021) does not include patient's race in the calculation of eGFR.  Monitoring changes of plasma creatinine and eGFR over time is useful for monitoring kidney function.  Interpretive Ranges for eGFR (CKD-EPI 2021):  eGFR:       >60 mL/min/1.73 sq. m - Normal eGFR:       30-59 mL/min/1.73 sq. m - Moderately Decreased eGFR:       15-29 mL/min/1.73 sq. m  - Severely Decreased eGFR:       < 15 mL/min/1.73 sq. m  - Kidney Failure   Note: These eGFR calculations do not apply in acute situations when eGFR is changing rapidly or patients on dialysis.  Calcium 8.7 - 10.3 mg/dL 9.4  AST 8 - 39 U/L 12  ALT 6 - 57 U/L 9  Alk Phos (alkaline Phosphatase) 34 - 104 U/L 47  Albumin 3.5 - 4.8 g/dL 3.4 Low   Bilirubin, Total 0.3 - 1.2 mg/dL 0.3  Protein, Total 6.1 - 7.9 g/dL 7.3  A/G Ratio 1.0 - 5.0 gm/dL 0.9 Low   Resulting Agency [facility]   Specimen Collected: 01/15/23 10:37   Performed by: [facility] Last Resulted: 01/15/23 16:23  Received From: [facility]  Result Received: 01/28/23 10:39  I have reviewed the labs.  See HPI.      Pertinent Imaging:  02/16/23 14:23  Scan Result 0    Assessment & Plan:    1. Urinary retention -resolved   2. BPH with LU TS -s/p HoLEP -did not discuss his BPH symptoms  3. Hypotensive and tachycardia -Patient with history of pulmonary embolisms and MAI found to be hypotensive and tacky at his office visit today -will transport him to the ED for further evaluation and workup   Michiel Cowboy, PA-C  [facility] [address]  [address] [place], [place] [address] [phone]

## 2023-02-16 ENCOUNTER — Encounter: Payer: Self-pay | Admitting: Emergency Medicine

## 2023-02-16 ENCOUNTER — Other Ambulatory Visit: Payer: Self-pay

## 2023-02-16 ENCOUNTER — Encounter: Payer: Self-pay | Admitting: Urology

## 2023-02-16 ENCOUNTER — Ambulatory Visit: Payer: Medicare Other | Admitting: Urology

## 2023-02-16 ENCOUNTER — Emergency Department
Admission: EM | Admit: 2023-02-16 | Discharge: 2023-02-16 | Disposition: A | Discharge status: Home / Self Care | Payer: Medicare Other | Attending: Emergency Medicine | Admitting: Emergency Medicine

## 2023-02-16 VITALS — BP 77/59 | HR 166

## 2023-02-16 DIAGNOSIS — Z951 Presence of aortocoronary bypass graft: Secondary | ICD-10-CM | POA: Diagnosis not present

## 2023-02-16 DIAGNOSIS — I10 Essential (primary) hypertension: Secondary | ICD-10-CM | POA: Diagnosis not present

## 2023-02-16 DIAGNOSIS — Z87438 Personal history of other diseases of male genital organs: Secondary | ICD-10-CM | POA: Diagnosis not present

## 2023-02-16 DIAGNOSIS — I471 Supraventricular tachycardia, unspecified: Secondary | ICD-10-CM | POA: Diagnosis not present

## 2023-02-16 DIAGNOSIS — Z09 Encounter for follow-up examination after completed treatment for conditions other than malignant neoplasm: Secondary | ICD-10-CM | POA: Diagnosis not present

## 2023-02-16 DIAGNOSIS — R002 Palpitations: Secondary | ICD-10-CM | POA: Diagnosis present

## 2023-02-16 DIAGNOSIS — N138 Other obstructive and reflux uropathy: Secondary | ICD-10-CM

## 2023-02-16 DIAGNOSIS — I2510 Atherosclerotic heart disease of native coronary artery without angina pectoris: Secondary | ICD-10-CM | POA: Diagnosis not present

## 2023-02-16 LAB — CBC
HCT: 32 % — ABNORMAL LOW (ref 39.0–52.0)
Hemoglobin: 10 g/dL — ABNORMAL LOW (ref 13.0–17.0)
MCH: 27.9 pg (ref 26.0–34.0)
MCHC: 31.3 g/dL (ref 30.0–36.0)
MCV: 89.4 fL (ref 80.0–100.0)
Platelets: 374 10*3/uL (ref 150–400)
RBC: 3.58 MIL/uL — ABNORMAL LOW (ref 4.22–5.81)
RDW: 16.9 % — ABNORMAL HIGH (ref 11.5–15.5)
WBC: 14 10*3/uL — ABNORMAL HIGH (ref 4.0–10.5)
nRBC: 0 % (ref 0.0–0.2)

## 2023-02-16 LAB — COMPREHENSIVE METABOLIC PANEL
ALT: 17 U/L (ref 0–44)
AST: 22 U/L (ref 15–41)
Albumin: 3.2 g/dL — ABNORMAL LOW (ref 3.5–5.0)
Alkaline Phosphatase: 49 U/L (ref 38–126)
Anion gap: 10 (ref 5–15)
BUN: 50 mg/dL — ABNORMAL HIGH (ref 8–23)
CO2: 23 mmol/L (ref 22–32)
Calcium: 8.8 mg/dL — ABNORMAL LOW (ref 8.9–10.3)
Chloride: 105 mmol/L (ref 98–111)
Creatinine, Ser: 1.79 mg/dL — ABNORMAL HIGH (ref 0.61–1.24)
GFR, Estimated: 38 mL/min — ABNORMAL LOW (ref >60)
Glucose, Bld: 230 mg/dL — ABNORMAL HIGH (ref 70–99)
Potassium: 3.9 mmol/L (ref 3.5–5.1)
Sodium: 138 mmol/L (ref 135–145)
Total Bilirubin: 0.5 mg/dL (ref <1.2)
Total Protein: 7.5 g/dL (ref 6.5–8.1)

## 2023-02-16 LAB — TROPONIN I (HIGH SENSITIVITY)
Troponin I (High Sensitivity): 45 ng/L — ABNORMAL HIGH (ref <18)
Troponin I (High Sensitivity): 40 ng/L — ABNORMAL HIGH (ref <18)

## 2023-02-16 LAB — BLADDER SCAN AMB NON-IMAGING: Scan Result: 0

## 2023-02-16 MED ORDER — SODIUM CHLORIDE 0.9 % IV BOLUS
1000.0000 mL | Freq: Once | INTRAVENOUS | Status: AC
  Administered 2023-02-16: 1000 mL via INTRAVENOUS

## 2023-02-16 NOTE — ED Triage Notes (Signed)
Patient to ED from urologist office for hypotension and tachycardia. Hx of blood clots. BP in the 80's and HR in the 160's at the office. Denies pain at this time. States he is having SOB.

## 2023-02-16 NOTE — Discharge Instructions (Addendum)
As we discussed please call your primary care doctor tomorrow to inform them of today's ER visit and your rapid heart rate.  Return to the emergency department for any further episodes of rapid heart rate, any chest pain trouble breathing or any other symptom concerning to yourself.  We have placed a referral to a cardiologist they should be calling you to arrange this appointment.  Please drink plenty of fluids, avoid any stimulants such as caffeine or nicotine and obtain plenty of rest.

## 2023-02-16 NOTE — ED Provider Notes (Signed)
[facility] Provider Note    Event Date/Time   First MD Initiated Contact with Patient 02/16/23 1546     (approximate)  History   Chief Complaint: Hypotension  HPI  Nicholas Alvarez is a 82 y.o. male with a past medical history of BPH, CAD status post MI in 2009, hypertension, cardiomyopathy, CABG in 2010, presents to the emergency department for rapid heart rate.  According to the patient he was having a routine follow-up appointment today at urology was found to have a heart rate in the 160 has been sent to the emergency department for further evaluation.  Patient denies any chest pain but states for the last 2 or 3 days he has been experiencing intermittent palpitations feeling that his heart is racing.  States he took an aspirin yesterday and he feels like it calm down after that.  Patient states some slight shortness of breath at times.  Denies any prior history of DVT or PE (contrary to triage note).  No pleuritic pain.  No leg pain or swelling.  Physical Exam   Triage Vital Signs: ED Triage Vitals  Encounter Vitals Group     BP 02/16/23 1543 (!) 91/56     Systolic BP Percentile --      Diastolic BP Percentile --      Pulse Rate 02/16/23 1543 (!) 166     Resp 02/16/23 1543 18     Temp 02/16/23 1543 97.7 F (36.5 C)     Temp Source 02/16/23 1543 Oral     SpO2 02/16/23 1543 97 %     Weight 02/16/23 1544 145 lb (65.8 kg)     Height 02/16/23 1544 5\' 10"  (1.778 m)     Head Circumference --      Peak Flow --      Pain Score 02/16/23 1543 0     Pain Loc --      Pain Education --      Exclude from Growth Chart --     Most recent vital signs: Vitals:   02/16/23 1543  BP: (!) 91/56  Pulse: (!) 166  Resp: 18  Temp: 97.7 F (36.5 C)  SpO2: 97%    General: Awake, no distress.  CV:  Good peripheral perfusion.  Regular rhythm rate around 160 bpm. Resp:  Normal effort.  Equal breath sounds bilaterally.  Abd:  No distention.  Soft, nontender.  No  rebound or guarding.   ED Results / Procedures / Treatments   EKG  EKG viewed and interpreted by myself appears to show supraventricular tachycardia 165 bpm with a narrow QRS, normal axis, normal intervals, nonspecific but no concerning ST changes.   MEDICATIONS ORDERED IN ED: Medications  sodium chloride 0.9 % bolus 1,000 mL (has no administration in time range)     IMPRESSION / MDM / ASSESSMENT AND PLAN / ED COURSE  I reviewed the triage vital signs and the nursing notes.  Patient's presentation is most consistent with acute presentation with potential threat to life or bodily function.  Patient presents to the emergency department for rapid heart rate found on a routine follow-up visit at urology today.  Patient states palpitations over the last 2 to 3 days.  EKG appears more consistent with SVT than any other arrhythmia.  Patient's blood pressure is somewhat soft currently 103 systolic we will IV hydrate with a liter of normal saline.  As long as the patient's blood pressure can increase slightly we will dose adenosine and  see if this response.  We will check labs including cardiac enzymes and continue to closely monitor.  No history of SVT A-fib or any other arrhythmia per patient.  Patient has converted back to a normal sinus rhythm after several 100 cc of IV fluids.  Repeat EKG viewed and interpreted by myself shows a normal sinus rhythm at 93 bpm with a narrow QRS, normal axis, normal intervals, no concerning ST changes.  Patient remains in normal sinus rhythm.  Troponin is stable.  Suspect more demand ischemia given 3 days of elevated heart rate.  As the patient continues to appear well we will discharge the patient home with cardiology follow-up for consideration of a Holter monitor.  Patient has a primary care doctor states he will call his doctor tomorrow to inform them of today's ER visit.  FINAL CLINICAL IMPRESSION(S) / ED DIAGNOSES   Supraventricular  tachycardia   Note:  This document was prepared using Dragon voice recognition software and may include unintentional dictation errors.   Minna Antis, MD 02/16/23 [id number]

## 2023-04-27 ENCOUNTER — Telehealth: Payer: Self-pay | Admitting: Cardiology

## 2023-04-27 NOTE — Telephone Encounter (Signed)
-----   Message from [facility] sent at 04/27/2023  9:31 AM EST ----- Pt has appointment tomorrow with Dr. Azucena Cecil and would like to reschedule due to upcoming weather concerns. Please reach out to pt to reschedule. Thank you, Jearld Adjutant, CMA

## 2023-04-27 NOTE — Telephone Encounter (Signed)
 Unable to leave voicemail due to mailbox full.

## 2023-04-28 ENCOUNTER — Ambulatory Visit: Payer: Medicare Other | Admitting: Cardiology

## 2023-05-11 ENCOUNTER — Other Ambulatory Visit: Payer: Self-pay | Admitting: Emergency Medicine

## 2023-05-11 DIAGNOSIS — R0602 Shortness of breath: Secondary | ICD-10-CM

## 2023-05-11 DIAGNOSIS — J849 Interstitial pulmonary disease, unspecified: Secondary | ICD-10-CM

## 2023-05-11 DIAGNOSIS — M3502 Sicca syndrome with lung involvement: Secondary | ICD-10-CM

## 2023-05-18 ENCOUNTER — Ambulatory Visit
Admission: RE | Admit: 2023-05-18 | Discharge: 2023-05-18 | Disposition: A | Discharge status: Home / Self Care | Source: Ambulatory Visit | Attending: Emergency Medicine | Admitting: Emergency Medicine

## 2023-05-18 DIAGNOSIS — R0602 Shortness of breath: Secondary | ICD-10-CM | POA: Insufficient documentation

## 2023-05-18 DIAGNOSIS — J849 Interstitial pulmonary disease, unspecified: Secondary | ICD-10-CM | POA: Diagnosis present

## 2023-05-18 DIAGNOSIS — M3502 Sicca syndrome with lung involvement: Secondary | ICD-10-CM | POA: Diagnosis present

## 2023-06-07 ENCOUNTER — Inpatient Hospital Stay
Admission: EM | Admit: 2023-06-07 | Discharge: 2023-07-08 | DRG: 291 | Disposition: E | Attending: Osteopathic Medicine | Admitting: Osteopathic Medicine

## 2023-06-07 ENCOUNTER — Emergency Department

## 2023-06-07 ENCOUNTER — Inpatient Hospital Stay

## 2023-06-07 ENCOUNTER — Encounter: Payer: Self-pay | Admitting: *Deleted

## 2023-06-07 ENCOUNTER — Other Ambulatory Visit: Payer: Self-pay

## 2023-06-07 DIAGNOSIS — Z8249 Family history of ischemic heart disease and other diseases of the circulatory system: Secondary | ICD-10-CM

## 2023-06-07 DIAGNOSIS — Z515 Encounter for palliative care: Secondary | ICD-10-CM

## 2023-06-07 DIAGNOSIS — Z8709 Personal history of other diseases of the respiratory system: Secondary | ICD-10-CM

## 2023-06-07 DIAGNOSIS — G928 Other toxic encephalopathy: Secondary | ICD-10-CM | POA: Diagnosis present

## 2023-06-07 DIAGNOSIS — J9601 Acute respiratory failure with hypoxia: Secondary | ICD-10-CM | POA: Diagnosis present

## 2023-06-07 DIAGNOSIS — Z888 Allergy status to other drugs, medicaments and biological substances status: Secondary | ICD-10-CM

## 2023-06-07 DIAGNOSIS — N179 Acute kidney failure, unspecified: Secondary | ICD-10-CM | POA: Diagnosis present

## 2023-06-07 DIAGNOSIS — M3500 Sicca syndrome, unspecified: Secondary | ICD-10-CM | POA: Diagnosis present

## 2023-06-07 DIAGNOSIS — I255 Ischemic cardiomyopathy: Secondary | ICD-10-CM | POA: Diagnosis present

## 2023-06-07 DIAGNOSIS — J479 Bronchiectasis, uncomplicated: Secondary | ICD-10-CM

## 2023-06-07 DIAGNOSIS — N183 Chronic kidney disease, stage 3 unspecified: Secondary | ICD-10-CM | POA: Diagnosis not present

## 2023-06-07 DIAGNOSIS — J849 Interstitial pulmonary disease, unspecified: Secondary | ICD-10-CM | POA: Diagnosis present

## 2023-06-07 DIAGNOSIS — Z7189 Other specified counseling: Secondary | ICD-10-CM | POA: Diagnosis not present

## 2023-06-07 DIAGNOSIS — I5043 Acute on chronic combined systolic (congestive) and diastolic (congestive) heart failure: Secondary | ICD-10-CM | POA: Diagnosis present

## 2023-06-07 DIAGNOSIS — F32A Depression, unspecified: Secondary | ICD-10-CM | POA: Diagnosis present

## 2023-06-07 DIAGNOSIS — R34 Anuria and oliguria: Secondary | ICD-10-CM | POA: Diagnosis present

## 2023-06-07 DIAGNOSIS — N1832 Chronic kidney disease, stage 3b: Secondary | ICD-10-CM | POA: Diagnosis present

## 2023-06-07 DIAGNOSIS — D631 Anemia in chronic kidney disease: Secondary | ICD-10-CM | POA: Diagnosis present

## 2023-06-07 DIAGNOSIS — Z1152 Encounter for screening for COVID-19: Secondary | ICD-10-CM

## 2023-06-07 DIAGNOSIS — I5023 Acute on chronic systolic (congestive) heart failure: Secondary | ICD-10-CM | POA: Diagnosis not present

## 2023-06-07 DIAGNOSIS — Z7951 Long term (current) use of inhaled steroids: Secondary | ICD-10-CM

## 2023-06-07 DIAGNOSIS — R7401 Elevation of levels of liver transaminase levels: Secondary | ICD-10-CM | POA: Diagnosis not present

## 2023-06-07 DIAGNOSIS — Z86718 Personal history of other venous thrombosis and embolism: Secondary | ICD-10-CM

## 2023-06-07 DIAGNOSIS — E872 Acidosis, unspecified: Secondary | ICD-10-CM | POA: Diagnosis present

## 2023-06-07 DIAGNOSIS — Z8619 Personal history of other infectious and parasitic diseases: Secondary | ICD-10-CM | POA: Diagnosis not present

## 2023-06-07 DIAGNOSIS — I5082 Biventricular heart failure: Secondary | ICD-10-CM | POA: Diagnosis present

## 2023-06-07 DIAGNOSIS — R0602 Shortness of breath: Secondary | ICD-10-CM

## 2023-06-07 DIAGNOSIS — R54 Age-related physical debility: Secondary | ICD-10-CM | POA: Diagnosis present

## 2023-06-07 DIAGNOSIS — I130 Hypertensive heart and chronic kidney disease with heart failure and stage 1 through stage 4 chronic kidney disease, or unspecified chronic kidney disease: Secondary | ICD-10-CM | POA: Diagnosis present

## 2023-06-07 DIAGNOSIS — Z8616 Personal history of COVID-19: Secondary | ICD-10-CM | POA: Diagnosis not present

## 2023-06-07 DIAGNOSIS — I2489 Other forms of acute ischemic heart disease: Secondary | ICD-10-CM | POA: Diagnosis present

## 2023-06-07 DIAGNOSIS — R570 Cardiogenic shock: Secondary | ICD-10-CM | POA: Diagnosis present

## 2023-06-07 DIAGNOSIS — E43 Unspecified severe protein-calorie malnutrition: Secondary | ICD-10-CM | POA: Diagnosis present

## 2023-06-07 DIAGNOSIS — Z825 Family history of asthma and other chronic lower respiratory diseases: Secondary | ICD-10-CM

## 2023-06-07 DIAGNOSIS — K7200 Acute and subacute hepatic failure without coma: Secondary | ICD-10-CM | POA: Diagnosis present

## 2023-06-07 DIAGNOSIS — Z79899 Other long term (current) drug therapy: Secondary | ICD-10-CM

## 2023-06-07 DIAGNOSIS — R059 Cough, unspecified: Secondary | ICD-10-CM

## 2023-06-07 DIAGNOSIS — I509 Heart failure, unspecified: Secondary | ICD-10-CM | POA: Diagnosis not present

## 2023-06-07 DIAGNOSIS — R7989 Other specified abnormal findings of blood chemistry: Secondary | ICD-10-CM

## 2023-06-07 DIAGNOSIS — Z8614 Personal history of Methicillin resistant Staphylococcus aureus infection: Secondary | ICD-10-CM

## 2023-06-07 DIAGNOSIS — Z711 Person with feared health complaint in whom no diagnosis is made: Secondary | ICD-10-CM | POA: Diagnosis not present

## 2023-06-07 DIAGNOSIS — D696 Thrombocytopenia, unspecified: Secondary | ICD-10-CM | POA: Diagnosis present

## 2023-06-07 DIAGNOSIS — Z66 Do not resuscitate: Secondary | ICD-10-CM | POA: Diagnosis not present

## 2023-06-07 DIAGNOSIS — R0609 Other forms of dyspnea: Secondary | ICD-10-CM | POA: Diagnosis not present

## 2023-06-07 DIAGNOSIS — R338 Other retention of urine: Secondary | ICD-10-CM | POA: Diagnosis present

## 2023-06-07 DIAGNOSIS — I471 Supraventricular tachycardia, unspecified: Secondary | ICD-10-CM | POA: Diagnosis present

## 2023-06-07 DIAGNOSIS — Z6822 Body mass index (BMI) 22.0-22.9, adult: Secondary | ICD-10-CM

## 2023-06-07 DIAGNOSIS — I2510 Atherosclerotic heart disease of native coronary artery without angina pectoris: Secondary | ICD-10-CM | POA: Diagnosis present

## 2023-06-07 DIAGNOSIS — N401 Enlarged prostate with lower urinary tract symptoms: Secondary | ICD-10-CM | POA: Diagnosis present

## 2023-06-07 DIAGNOSIS — Z87891 Personal history of nicotine dependence: Secondary | ICD-10-CM

## 2023-06-07 DIAGNOSIS — Z951 Presence of aortocoronary bypass graft: Secondary | ICD-10-CM

## 2023-06-07 DIAGNOSIS — E785 Hyperlipidemia, unspecified: Secondary | ICD-10-CM | POA: Diagnosis present

## 2023-06-07 DIAGNOSIS — Z86711 Personal history of pulmonary embolism: Secondary | ICD-10-CM

## 2023-06-07 DIAGNOSIS — I252 Old myocardial infarction: Secondary | ICD-10-CM

## 2023-06-07 DIAGNOSIS — Z7901 Long term (current) use of anticoagulants: Secondary | ICD-10-CM

## 2023-06-07 LAB — BLOOD GAS, VENOUS

## 2023-06-07 LAB — COMPREHENSIVE METABOLIC PANEL WITH GFR
ALT: 1139 U/L — ABNORMAL HIGH (ref 0–44)
ALT: 1092 U/L — ABNORMAL HIGH (ref 0–44)
AST: 1428 U/L — ABNORMAL HIGH (ref 15–41)
AST: 1298 U/L — ABNORMAL HIGH (ref 15–41)
Albumin: 3.3 g/dL — ABNORMAL LOW (ref 3.5–5.0)
Albumin: 3 g/dL — ABNORMAL LOW (ref 3.5–5.0)
Alkaline Phosphatase: 71 U/L (ref 38–126)
Alkaline Phosphatase: 66 U/L (ref 38–126)
Anion gap: 17 — ABNORMAL HIGH (ref 5–15)
Anion gap: 16 — ABNORMAL HIGH (ref 5–15)
BUN: 70 mg/dL — ABNORMAL HIGH (ref 8–23)
BUN: 71 mg/dL — ABNORMAL HIGH (ref 8–23)
CO2: 23 mmol/L (ref 22–32)
CO2: 19 mmol/L — ABNORMAL LOW (ref 22–32)
Calcium: 9.1 mg/dL (ref 8.9–10.3)
Calcium: 8.7 mg/dL — ABNORMAL LOW (ref 8.9–10.3)
Chloride: 100 mmol/L (ref 98–111)
Chloride: 101 mmol/L (ref 98–111)
Creatinine, Ser: 2.86 mg/dL — ABNORMAL HIGH (ref 0.61–1.24)
Creatinine, Ser: 2.89 mg/dL — ABNORMAL HIGH (ref 0.61–1.24)
GFR, Estimated: 22 mL/min — ABNORMAL LOW (ref >60)
GFR, Estimated: 21 mL/min — ABNORMAL LOW (ref >60)
Glucose, Bld: 43 mg/dL — CL (ref 70–99)
Glucose, Bld: 95 mg/dL (ref 70–99)
Potassium: 5.1 mmol/L (ref 3.5–5.1)
Potassium: 5 mmol/L (ref 3.5–5.1)
Sodium: 140 mmol/L (ref 135–145)
Sodium: 136 mmol/L (ref 135–145)
Total Bilirubin: 2 mg/dL — ABNORMAL HIGH (ref 0.0–1.2)
Total Bilirubin: 1.7 mg/dL — ABNORMAL HIGH (ref 0.0–1.2)
Total Protein: 7 g/dL (ref 6.5–8.1)
Total Protein: 6.2 g/dL — ABNORMAL LOW (ref 6.5–8.1)

## 2023-06-07 LAB — RESP PANEL BY RT-PCR (RSV, FLU A&B, COVID)  RVPGX2
Influenza A by PCR: NEGATIVE
Influenza B by PCR: NEGATIVE
Resp Syncytial Virus by PCR: NEGATIVE
SARS Coronavirus 2 by RT PCR: NEGATIVE

## 2023-06-07 LAB — CBC WITH DIFFERENTIAL/PLATELET
Abs Immature Granulocytes: 0.07 10*3/uL (ref 0.00–0.07)
Basophils Absolute: 0 10*3/uL (ref 0.0–0.1)
Basophils Relative: 0 %
Eosinophils Absolute: 0 10*3/uL (ref 0.0–0.5)
Eosinophils Relative: 0 %
HCT: 34.2 % — ABNORMAL LOW (ref 39.0–52.0)
Hemoglobin: 10.4 g/dL — ABNORMAL LOW (ref 13.0–17.0)
Immature Granulocytes: 1 %
Lymphocytes Relative: 9 %
Lymphs Abs: 1 10*3/uL (ref 0.7–4.0)
MCH: 26.6 pg (ref 26.0–34.0)
MCHC: 30.4 g/dL (ref 30.0–36.0)
MCV: 87.5 fL (ref 80.0–100.0)
Monocytes Absolute: 1 10*3/uL (ref 0.1–1.0)
Monocytes Relative: 9 %
Neutro Abs: 9.4 10*3/uL — ABNORMAL HIGH (ref 1.7–7.7)
Neutrophils Relative %: 81 %
Platelets: 304 10*3/uL (ref 150–400)
RBC: 3.91 MIL/uL — ABNORMAL LOW (ref 4.22–5.81)
RDW: 18.3 % — ABNORMAL HIGH (ref 11.5–15.5)
WBC: 11.5 10*3/uL — ABNORMAL HIGH (ref 4.0–10.5)
nRBC: 0.4 % — ABNORMAL HIGH (ref 0.0–0.2)

## 2023-06-07 LAB — BLOOD GAS, ARTERIAL
Acid-base deficit: 6 mmol/L — ABNORMAL HIGH (ref 0.0–2.0)
Bicarbonate: 18.8 mmol/L — ABNORMAL LOW (ref 20.0–28.0)
Delivery systems: POSITIVE
Expiratory PAP: 5 cmH2O
FIO2: 40 %
Inspiratory PAP: 10 cmH2O
Mechanical Rate: 10
O2 Saturation: 99.2 %
Patient temperature: 37
pCO2 arterial: 34 mmHg (ref 32–48)
pH, Arterial: 7.35 (ref 7.35–7.45)
pO2, Arterial: 168 mmHg — ABNORMAL HIGH (ref 83–108)

## 2023-06-07 LAB — PROTIME-INR
INR: 2.4 — ABNORMAL HIGH (ref 0.8–1.2)
Prothrombin Time: 26.5 s — ABNORMAL HIGH (ref 11.4–15.2)

## 2023-06-07 LAB — CBG MONITORING, ED
Glucose-Capillary: 84 mg/dL (ref 70–99)
Glucose-Capillary: 98 mg/dL (ref 70–99)

## 2023-06-07 LAB — HEPATITIS PANEL, ACUTE
HCV Ab: NONREACTIVE
Hep A IgM: NONREACTIVE
Hep B C IgM: NONREACTIVE
Hepatitis B Surface Ag: NONREACTIVE

## 2023-06-07 LAB — BRAIN NATRIURETIC PEPTIDE
B Natriuretic Peptide: 3164.6 pg/mL — ABNORMAL HIGH (ref 0.0–100.0)
B Natriuretic Peptide: 2985.2 pg/mL — ABNORMAL HIGH (ref 0.0–100.0)

## 2023-06-07 LAB — TROPONIN I (HIGH SENSITIVITY)
Troponin I (High Sensitivity): 395 ng/L (ref <18)
Troponin I (High Sensitivity): 377 ng/L (ref <18)
Troponin I (High Sensitivity): 390 ng/L (ref <18)

## 2023-06-07 LAB — APTT: aPTT: 35 s (ref 24–36)

## 2023-06-07 LAB — LACTIC ACID, PLASMA
Lactic Acid, Venous: 5 mmol/L (ref 0.5–1.9)
Lactic Acid, Venous: 4.4 mmol/L (ref 0.5–1.9)
Lactic Acid, Venous: 3.9 mmol/L (ref 0.5–1.9)

## 2023-06-07 LAB — MAGNESIUM: Magnesium: 2.3 mg/dL (ref 1.7–2.4)

## 2023-06-07 LAB — TSH: TSH: 2.029 u[IU]/mL (ref 0.350–4.500)

## 2023-06-07 LAB — T4, FREE: Free T4: 0.94 ng/dL (ref 0.61–1.12)

## 2023-06-07 LAB — MRSA NEXT GEN BY PCR, NASAL: MRSA by PCR Next Gen: DETECTED — AB

## 2023-06-07 LAB — GLUCOSE, CAPILLARY: Glucose-Capillary: 71 mg/dL (ref 70–99)

## 2023-06-07 LAB — PHOSPHORUS: Phosphorus: 6.8 mg/dL — ABNORMAL HIGH (ref 2.5–4.6)

## 2023-06-07 LAB — ACETAMINOPHEN LEVEL: Acetaminophen (Tylenol), Serum: <10 ug/mL — ABNORMAL LOW (ref 10–30)

## 2023-06-07 LAB — HEPARIN LEVEL (UNFRACTIONATED): Heparin Unfractionated: 0.46 [IU]/mL (ref 0.30–0.70)

## 2023-06-07 MED ORDER — DEXTROSE 10 % IV BOLUS
250.0000 mL | Freq: Once | INTRAVENOUS | Status: AC
  Administered 2023-06-07: 250 mL via INTRAVENOUS
  Filled 2023-06-07: qty 500

## 2023-06-07 MED ORDER — FUROSEMIDE 10 MG/ML IJ SOLN
80.0000 mg | Freq: Two times a day (BID) | INTRAMUSCULAR | Status: DC
  Administered 2023-06-07: 80 mg via INTRAVENOUS
  Filled 2023-06-07: qty 8

## 2023-06-07 MED ORDER — FUROSEMIDE 10 MG/ML IJ SOLN
10.0000 mg/h | INTRAVENOUS | Status: DC
  Administered 2023-06-08: 10 mg/h via INTRAVENOUS
  Administered 2023-06-08: 8 mg/h via INTRAVENOUS
  Administered 2023-06-09: 10 mg/h via INTRAVENOUS
  Filled 2023-06-07 (×3): qty 20

## 2023-06-07 MED ORDER — POLYETHYLENE GLYCOL 3350 17 G PO PACK: 17.0000 g | PACK | Freq: Every day | ORAL | Status: DC | PRN

## 2023-06-07 MED ORDER — HEPARIN (PORCINE) 25000 UT/250ML-% IV SOLN
1350.0000 [IU]/h | INTRAVENOUS | Status: DC
  Administered 2023-06-07: 1100 [IU]/h via INTRAVENOUS
  Administered 2023-06-08: 1300 [IU]/h via INTRAVENOUS
  Administered 2023-06-09 – 2023-06-10 (×2): 1350 [IU]/h via INTRAVENOUS
  Filled 2023-06-07 (×4): qty 250

## 2023-06-07 MED ORDER — AMIODARONE IV BOLUS ONLY 150 MG/100ML
150.0000 mg | Freq: Once | INTRAVENOUS | Status: AC
  Administered 2023-06-07: 150 mg via INTRAVENOUS
  Filled 2023-06-07: qty 100

## 2023-06-07 MED ORDER — SODIUM BICARBONATE 8.4 % IV SOLN
100.0000 meq | Freq: Once | INTRAVENOUS | Status: AC
  Administered 2023-06-08: 100 meq via INTRAVENOUS
  Filled 2023-06-07: qty 50

## 2023-06-07 MED ORDER — PIPERACILLIN-TAZOBACTAM 3.375 G IVPB 30 MIN
3.3750 g | Freq: Once | INTRAVENOUS | Status: AC
  Administered 2023-06-07: 3.375 g via INTRAVENOUS
  Filled 2023-06-07 (×2): qty 50

## 2023-06-07 MED ORDER — HEPARIN SODIUM (PORCINE) 5000 UNIT/ML IJ SOLN: 5000.0000 [IU] | Freq: Three times a day (TID) | INTRAMUSCULAR | Status: DC

## 2023-06-07 MED ORDER — FUROSEMIDE 10 MG/ML IJ SOLN
40.0000 mg | Freq: Once | INTRAMUSCULAR | Status: AC
  Administered 2023-06-07: 40 mg via INTRAVENOUS
  Filled 2023-06-07: qty 4

## 2023-06-07 MED ORDER — VANCOMYCIN HCL 1500 MG/300ML IV SOLN
1500.0000 mg | Freq: Once | INTRAVENOUS | Status: AC
  Administered 2023-06-07: 1500 mg via INTRAVENOUS
  Filled 2023-06-07: qty 300

## 2023-06-07 MED ORDER — DOCUSATE SODIUM 100 MG PO CAPS: 100.0000 mg | ORAL_CAPSULE | Freq: Two times a day (BID) | ORAL | Status: DC | PRN

## 2023-06-07 NOTE — ED Notes (Signed)
 Maggie, NP called regarding pt HR and rhythm/ pt denies CP or SHOB at this time

## 2023-06-07 NOTE — ED Notes (Signed)
 Dr Jodie Echevaria at bedside with u/s.   Meds given.

## 2023-06-07 NOTE — Progress Notes (Signed)
 PHARMACY - ANTICOAGULATION CONSULT NOTE  Pharmacy Consult for Heparin Infusion- per MD, no bolus Indication: DVT  Allergies  Allergen Reactions   Ace Inhibitors Cough    Patient Measurements: Height: 5\' 10"  (177.8 cm) Weight: 71.2 kg (157 lb) IBW/kg (Calculated) : 73 HEPARIN DW (KG): 71.2  Vital Signs: Temp: 96.4 F (35.8 C) (03/31 1538) Temp Source: Oral (03/31 1538) BP: 115/67 (03/31 1815) Pulse Rate: 75 (03/31 1823)  Labs: Recent Labs    06/07/23 1542 06/07/23 1730  HGB 10.4*  --   HCT 34.2*  --   PLT 304  --   LABPROT 26.5*  --   INR 2.4*  --   CREATININE 2.86*  --   TROPONINIHS 395* 377*    Estimated Creatinine Clearance: 20.7 mL/min (A) (by C-G formula based on SCr of 2.86 mg/dL (H)).   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    Chronic anemia    COVID-19    Depression    Dyslipidemia    Dyspnea    ED (erectile dysfunction)    Elevated PSA    Falls    Gross hematuria    History of methicillin resistant staphylococcus aureus (MRSA) 02/2022   + PCR nasal swab   Hx of hypotension    was placed on midodrine and then taken off   Hypertension    Interstitial lung disease (HCC)    Ischemic cardiomyopathy    with left ventricular ejection fraction of 35%   Left ventricular aneurysm    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 Last dose 3/30 @ 1300.  Assessment: Patient is a 83 year old male with a past medical history of CHF with reduced  ejection fraction, chronic MAI infection, bronchiectasis, ILD, CAD status post CABG, PE on Eliquis, presenting with shortness of breath and leg swelling. Pharmacy has been consulted to initiate patient on a heparin infusion for VTE treatment.  Baseline heparin level and aPTT ordered. Baseline INR elevated at 2.4.  No signs/symptoms of bleeding noted in chart. Hgb 10.4 (above baseline). PLT 304.  Goal of Therapy:  Heparin level 0.3-0.7 units/ml aPTT 66-102 seconds Monitor platelets by anticoagulation protocol: Yes   Plan:  No bolus per MD Start heparin infusion at a rate of 1100 units/hr Will monitor via aPTT levels until heparin level correlates, then can transition to heparin level monitoring only Check aPTT in 8 hours, heparin levels daily until correlating with aPTT Monitor CBC daily while on heparin   Merryl Hacker, PharmD Clinical Pharmacist  06/07/2023,7:07 PM

## 2023-06-07 NOTE — ED Notes (Signed)
Fsbs 98

## 2023-06-07 NOTE — Consult Note (Signed)
 ED Pharmacy Antibiotic Sign Off An antibiotic consult was received from an ED provider for Vancomycin and Zosyn per pharmacy dosing for pneumonia. A chart review was completed to assess appropriateness.   The following one time order(s) were placed:  Vancomycin 1.5g IV and Zosyn 3.375g IV.  Further antibiotic and/or antibiotic pharmacy consults should be ordered by the admitting provider if indicated.   Thank you for allowing pharmacy to be a part of this patient's care.   Bettey Costa, [facility]  Clinical Pharmacist 06/07/23 4:13 PM

## 2023-06-07 NOTE — Progress Notes (Signed)
 eLink Physician-Brief Progress Note Patient Name: Nicholas Alvarez DOB: [birth year] MRN: [id number]   Date of Service  06/07/2023  HPI/Events of Note  25M with chronic systolic heart failure, s/p CABG bronchiectasis 2/2 MAI, who p/w SOB, LE edema and AMS. Found with hypotension with nursing visit. In the ED found hypoxemic and hypotensive. LA 5. Started on IV lasix, abx and BiPAP. Admitted to ICU  Arrived on BiPAP. Appears comfortable with intermittent cough. HR currently in 80s  eICU Interventions  Diurese BiPAP Holding abx per primary team     Intervention Category Evaluation Type: New Patient Evaluation  Fantasha Daniele Mechele Collin 06/07/2023, 11:45 PM

## 2023-06-07 NOTE — Progress Notes (Signed)
 PHARMACY CONSULT NOTE - FOLLOW UP  Pharmacy Consult for Electrolyte Monitoring and Replacement   Recent Labs: Potassium (mmol/L)  Date Value  06/07/2023 5.1  10/24/2012 3.7   Magnesium (mg/dL)  Date Value  16/12/9602 2.2   Calcium (mg/dL)  Date Value  54/11/8117 9.1   Calcium, Total (mg/dL)  Date Value  14/78/2956 9.3   Albumin (g/dL)  Date Value  21/30/8657 3.3 (L)  10/24/2012 3.4   Sodium (mmol/L)  Date Value  06/07/2023 140  10/24/2012 138     Assessment: Patient is a 83 year old male with a past medical history of CHF with reduced ejection fraction, chronic MAI infection, bronchiectasis, ILD, CAD status post CABG, PE on Eliquis, presenting with shortness of breath and leg swelling. Pharmacy has been consulted to monitor and replace electrolytes as needed via CCM protocol.   Diet: diet currently not ordered MIVF: none; s/p Dextrose 10% 250 ml bolus x 1 Pertinent medications: furosemide 40 mg IV x 1  K slightly elevated at 5.1, no updated Mag or Phos level  Goal of Therapy:  Electrolytes wnl  Plan:  Will leave treatment of K up to provider given it is only slightly elevated Will order updated Mag and Phos levels with AM labs Monitor BMP with AM labs  Merryl Hacker ,PharmD Clinical Pharmacist 06/07/2023 6:26 PM

## 2023-06-07 NOTE — ED Triage Notes (Signed)
 Pt brought in via ems from home with sob.  Pt on bipap on arrival  dr tan in with pt  RT at bedside.  Pt waake and talking.  Iv in place.

## 2023-06-07 NOTE — H&P (Signed)
 NAME:  Nicholas Alvarez, MRN:  [id number], DOB:  February 23, 1941, LOS: 0 ADMISSION DATE:  06/07/2023, CONSULTATION DATE:  06/07/2023 REFERRING MD:  Claybon Jabs, MD, CHIEF COMPLAINT:  Respiratory Failure   History of Present Illness:   Patient is an 83 year old male presenting to the hospital with increased shortness of breath and altered mental status.  He was altered at the time of our interview and most of the history was obtained from his wife.  Patient's wife reports symptoms of increased fatigue and shortness of breath for the past couple of weeks. He has been unable to fall asleep laying flat, and has been sleeping in a recliner. He's also reported decreased appetite over the past week which is unusual for him. Wife also reports significant lower extremity edema which is similarly unusual for him. No report of chest pain or arm numbness recently, and no similar symptoms in the past (no signs of heart failure). Patient's wife does report he's been eating more frozen dinners recently. They did have a visiting nurse see him and he was noted to be significantly hypotensive, prompting presentation to the ED for an evaluation.  In the ED, he was hypoxic and hypotensive. Blood work showed an elevated troponin, and significantly elevated AST/ALT. Lactic acid was initially elevated at 5.0. Bedside POCUS by EDP showed a dilated IVC, and diffuse b-lines in all lung fields. He received IV antibiotics as well as IV furosemide and was started on BiPAP for respiratory support with improvement in BP and hemodynamics. Repeat lactic acid improved to 4.4. PCCM is consulted to admit.  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 ILD  Significant Hospital Events: Including procedures, antibiotic start and stop dates in addition to other pertinent events   06/07/2023: presented to the ED, on BiPAP, admit to ICU. 40 of IV furosemide  administered.  Interim History / Subjective:  Patient somnolent during our interview, history obtained from wife  Objective   Blood pressure 113/72, pulse 89, temperature (!) 96.4 F (35.8 C), temperature source Oral, resp. rate (!) 28, height 5\' 10"  (1.778 m), weight 71.2 kg, SpO2 100%.    FiO2 (%):  [40 %] 40 % Pressure Support:  [5 cmH20] 5 cmH20  No intake or output data in the 24 hours ending 06/07/23 1815 Filed Weights   06/07/23 1536  Weight: 71.2 kg    Examination: Physical Exam Constitutional:      General: He is not in acute distress.    Appearance: He is ill-appearing.  HENT:     Mouth/Throat:     Mouth: Mucous membranes are dry.  Cardiovascular:     Rate and Rhythm: Normal rate and regular rhythm.     Pulses: Normal pulses.     Heart sounds: Normal heart sounds.  Pulmonary:     Effort: Pulmonary effort is normal.     Breath sounds: Rhonchi and rales present. No wheezing.  Abdominal:     General: There is distension.     Palpations: Abdomen is soft.  Musculoskeletal:     Right lower leg: Edema present.     Left lower leg: Edema present.  Neurological:     Mental Status: He is disoriented.     Motor: Weakness present.     Assessment & Plan:   #Acute Hypoxic Respiratory Failure #Acute Decompensated Heart Failure with Reduced Ejection Fraction #AKI #Elevated Transaminases #CAD s/p CABG #Chronic MAI infection #Bronchiectasis without flare  Neuro:  Patient encephalopathic in the setting of decompensated heart failure and possibly superimposed infection. Blood gas doesn't show any CO2 narcosis, avoid psychotropic meds as able. CV: Clinically in decompensated heart failure, previous TTE 10/2022 showed EF of 30% with wall motion abnormalities, patient does not appear to be on GDMT or to be followed by cardiology. Troponin mildly elevated, downtrending. BNP is pending, but overall presentation is consistent with heart failure (rales, edema, distended IVC,  b-lines) and he did receive a dose of furosemide (no I/O's documented). Presentation is complicated by kidney dysfunction. He is on diltiazem at home which I will hold given concern for heart failure.  -continue IV diuresis, increase to 80 bid  -TTE  -heart failure consult in AM  -check BNP  -trend troponin  -will consider central access to check co-oximetry  -strict I/O  -trend lactic acid Pulm: Respiratory failure secondary to pulmonary edema with bilateral pleural effusions on EDP POCUS. He's had bronchiectasis secondary to MAI, with concern for ILD in the past as well. I have personally reviewed his chest CT's and note the bronchiectasis and findings that are consistent with atypical mycobacterial infections. I don't suspect this process to be the driver behind his current presentation, with more weight placed towards pulmonary edema. 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 TTE to rule out pulmonary hypertension and RV dysfunction. -continue BiPAP -aggressive diuresis GI: elevated AST and ALT in the setting of statin use as well as decompensated heart failure. Holding statins and hepatotoxins and will trend LFT's Renal: AKI in the setting of decompensated heart failure and decreased PO intake. His kidney function is normal at baseline, and pre-renal azotemia, cardio-renal syndrome, and drug induced nephrotoxicity are all on the differential. Will check renal ultrasound. Endo: ICU glycemic protocol Hem/Onc: On apixaban for treatment of previous VTE, will switch to IV heparin. ID: history of MAI, treated with ethambutol and azithromycin (rifampin held due to hepatotoxicity). This presentation is not consistent with a pneumonia or an overt infection. Received broad spectrum antibiotics in the ED, will hold off on further antibiotics for the time being.  Best Practice (right click and  "Reselect all SmartList Selections" daily)   Diet/type: NPO DVT prophylaxis systemic heparin Pressure ulcer(s): N/A GI prophylaxis: N/A Lines: N/A Foley:  N/A Code Status:  full code Last date of multidisciplinary goals of care discussion [06/07/2023]  Labs   CBC: Recent Labs  Lab 06/07/23 1542  WBC 11.5*  NEUTROABS 9.4*  HGB 10.4*  HCT 34.2*  MCV 87.5  PLT 304    Basic Metabolic Panel: Recent Labs  Lab 06/07/23 1542  NA 140  K 5.1  CL 100  CO2 23  GLUCOSE 43*  BUN 70*  CREATININE 2.86*  CALCIUM 9.1   GFR: Estimated Creatinine Clearance: 20.7 mL/min (A) (by C-G formula based on SCr of 2.86 mg/dL (H)). Recent Labs  Lab 06/07/23 1542 06/07/23 1730  WBC 11.5*  --   LATICACIDVEN 5.0* 4.4*    Liver Function Tests: Recent Labs  Lab 06/07/23 1542  AST 1,428*  ALT 1,139*  ALKPHOS 71  BILITOT 2.0*  PROT 7.0  ALBUMIN 3.3*   No results for input(s): "LIPASE", "AMYLASE" in the last 168 hours. No results for input(s): "AMMONIA" in the last 168 hours.  ABG    Component Value Date/Time   PHART 7.411 03/12/2008 1811   PCO2ART 34.3 (L) 03/12/2008 1811   PO2ART 78.0 (L) 03/12/2008 1811  HCO3 24.2 06/07/2023 1550   TCO2 21 03/12/2008 1953   ACIDBASEDEF 2.2 (H) 06/07/2023 1550   O2SAT 41.7 06/07/2023 1550     Coagulation Profile: Recent Labs  Lab 06/07/23 1542  INR 2.4*    Cardiac Enzymes: No results for input(s): "CKTOTAL", "CKMB", "CKMBINDEX", "TROPONINI" in the last 168 hours.  HbA1C: Hgb A1c MFr Bld  Date/Time Value Ref Range Status  03/07/2008 08:00 PM  4.6 - 6.1 % Final   5.9 (NOTE)   The ADA recommends the following therapeutic goal for glycemic   control related to Hgb A1C measurement:   Goal of Therapy:   < 7.0% Hgb A1C   Reference: American Diabetes Association: Clinical Practice   Recommendations 2008, Diabetes Care,  2008, 31:(Suppl 1).    CBG: Recent Labs  Lab 06/07/23 1741  GLUCAP 84    Review of Systems:   Unable to  obtain  Past Medical History:  He,  has a past medical history of Acute hypoxemic respiratory failure (HCC), BPH (benign prostatic hyperplasia), Bronchiectasis (HCC), CAD (coronary artery disease), Chronic anemia, COVID-19, Depression, Dyslipidemia, Dyspnea, ED (erectile dysfunction), Elevated PSA, Falls, Gross hematuria, History of methicillin resistant staphylococcus aureus (MRSA) (02/2022), hypotension, Hypertension, Interstitial lung disease (HCC), Ischemic cardiomyopathy, Left ventricular aneurysm, 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   Surgical History:   Past Surgical History:  Procedure Laterality Date   BRONCHIAL WASHINGS N/A 01/16/2022   Procedure: BRONCHIAL WASHINGS;  Surgeon: Vida Rigger, MD;  Location: [facility] ORS;  Service: Thoracic;  Laterality: N/A;   CARDIAC CATHETERIZATION  03/2008   COLONOSCOPY     CORONARY ARTERY BYPASS GRAFT  03/12/2008   HOLEP-LASER ENUCLEATION OF THE PROSTATE WITH MORCELLATION N/A 10/12/2022   Procedure: HOLEP-LASER ENUCLEATION OF THE PROSTATE WITH MORCELLATION;  Surgeon: Vanna Scotland, MD;  Location: [facility] ORS;  Service: Urology;  Laterality: N/A;   PROSTATE BIOPSY  11/2009   RIGID BRONCHOSCOPY N/A 01/16/2022   Procedure: RIGID BRONCHOSCOPY;  Surgeon: Vida Rigger, MD;  Location: [facility] ORS;  Service: Thoracic;  Laterality: N/A;     Social History:   reports that he has quit smoking. His smoking use included cigarettes. He has been exposed to tobacco smoke. He has never used smokeless tobacco. He reports that he does not currently use alcohol after a past usage of about 12.0 standard drinks of alcohol per week. He reports that he does not use drugs.   Family History:  His family history includes COPD in his mother; Cancer in his sister; Coronary  artery disease in an other family member; Heart attack (age of onset: 59) in his maternal grandfather and paternal grandfather; Heart attack (age of onset: 39) in his sister; Pneumonia in his father.   Allergies Allergies  Allergen Reactions   Ace Inhibitors Cough     Home Medications  Prior to Admission medications   Medication Sig Start Date End Date Taking? Authorizing Provider  albuterol (VENTOLIN HFA) 108 (90 Base) MCG/ACT inhaler Inhale 1-2 puffs into the lungs every 6 (six) hours as needed for wheezing or shortness of breath.    [provider]  apixaban (ELIQUIS) 5 MG TABS tablet Take 2 tablets (10 mg total) by mouth 2 (two) times daily for 6 days, THEN 1 tablet (5 mg total) 2 (two) times daily. 11/03/22 12/09/22  Sunnie Nielsen, DO  budesonide (PULMICORT) 0.25 MG/2ML nebulizer solution Take by nebulization  daily.    [provider]  DULoxetine (CYMBALTA) 60 MG capsule Take by mouth. 11/12/22 11/12/23  [provider]  ethambutol (MYAMBUTOL) 400 MG tablet Take 1,800 mg by mouth 3 (three) times a week. M, W, F-takes 4.5 tablets    [provider]  feeding supplement (ENSURE ENLIVE / ENSURE PLUS) LIQD Take 237 mLs by mouth 3 (three) times daily between meals. 11/03/22   Sunnie Nielsen, DO  finasteride (PROSCAR) 5 MG tablet Take 5 mg by mouth every morning.    [provider]  guaiFENesin (MUCINEX) 600 MG 12 hr tablet Take 1 tablet (600 mg total) by mouth 2 (two) times daily. 11/03/22   Sunnie Nielsen, DO  mirtazapine (REMERON) 15 MG tablet Take 1 tablet by mouth at bedtime. 07/16/22 07/16/23  [provider]  Multiple Vitamin (MULTIVITAMIN WITH MINERALS) TABS tablet Take 1 tablet by mouth daily. 11/04/22   Sunnie Nielsen, DO  oxybutynin (DITROPAN) 5 MG tablet Take 1 tablet (5 mg total) by mouth every 8 (eight) hours as needed for bladder spasms. 10/12/22   Vanna Scotland, MD  rifampin (RIFADIN) 300 MG capsule Take 600 mg by mouth 3  (three) times a week. M, W, F    [provider]  simvastatin (ZOCOR) 40 MG tablet Take 40 mg by mouth every morning.    [provider]  Vibegron (GEMTESA) 75 MG TABS Take 1 tablet (75 mg total) by mouth daily. 12/28/22   Harle Battiest, PA-C     Critical care time: 65 minutes    Raechel Chute, MD [facility] 06/07/2023 7:19 PM

## 2023-06-07 NOTE — ED Notes (Signed)
 Pure wick in place

## 2023-06-07 NOTE — ED Notes (Signed)
 Fsbs 84 after d10 bolus.

## 2023-06-07 NOTE — ED Notes (Signed)
Family in with pt 

## 2023-06-07 NOTE — ED Notes (Signed)
 CCM called/ pt had a 4 beat run of V-tach/ pt is laying on side getting an renal US. Provider made aware/ pt is alert and oriented/ no complaints at this time

## 2023-06-07 NOTE — ED Notes (Signed)
 Pt brought in via ems from home with sob for 1 week  pt on bipap on arrival     denies chest pain.   Rt and md at bedside.  2nd iv  started and labs sent.    Pt has a cough  hx cig smoking  no fever.

## 2023-06-07 NOTE — Progress Notes (Signed)
Transported pt to ICU 4 on Bipap without incident. Pt remains on Bipap and is tol well at this time. Report given to ICU RT.

## 2023-06-07 NOTE — ED Provider Notes (Signed)
 [facility] Provider Note    Event Date/Time   First MD Initiated Contact with Patient 06/07/23 1535     (approximate)   History   No chief complaint on file.   HPI  Nicholas Alvarez is a 83 y.o. male with history of CHF with reduced ejection fraction, chronic MAI infection, bronchiectasis, ILD, CAD status post CABG, PE on Eliquis, presenting with shortness of breath, leg swelling.  Per EMS, he was found satting in the 80s on room air.  Patient states he is not typically on oxygen.  They gave him a DuoNeb which helped some of his symptoms, placed him on CPAP with improvement to his oxygenation.  Patient states that he has no chest pain, no fever, no abdominal pain or nausea, vomiting, diarrhea, urinary symptoms.  States that he does have a cough.   Independent history obtained from EMS.  On independent chart review, he was admitted in August 2020 for for sepsis due to multifocal pneumonia and questionable recurrence of MAI, was also found to have incidental bilateral subset mental PEs.  His echo that was done showed an EF of 30 to 35%.  He is on Lasix as needed.  Physical Exam   Triage Vital Signs: ED Triage Vitals  Encounter Vitals Group     BP 06/07/23 1538 93/70     Systolic BP Percentile --      Diastolic BP Percentile --      Pulse Rate 06/07/23 1538 (!) 145     Resp 06/07/23 1538 (!) 22     Temp 06/07/23 1538 (!) 96.4 F (35.8 C)     Temp Source 06/07/23 1538 Oral     SpO2 --      Weight 06/07/23 1536 157 lb (71.2 kg)     Height 06/07/23 1536 5\' 10"  (1.778 m)     Head Circumference --      Peak Flow --      Pain Score 06/07/23 1536 0     Pain Loc --      Pain Education --      Exclude from Growth Chart --     Most recent vital signs: Vitals:   06/07/23 1630 06/07/23 1645  BP: 103/74 110/85  Pulse: (!) 36 83  Resp: 17 16  Temp:    SpO2: 94% 100%     General: Awake, no distress.  CV:  Good peripheral perfusion.  Resp:  Normal  effort.  Bilateral coarse breath sounds, tachypneic Abd:  No distention.  Soft nontender Other:  Lower extremity edema that appears symmetrical.   ED Results / Procedures / Treatments   Labs (all labs ordered are listed, but only abnormal results are displayed) Labs Reviewed  COMPREHENSIVE METABOLIC PANEL WITH GFR - Abnormal; Notable for the following components:      Result Value   Glucose, Bld 43 (*)    BUN 70 (*)    Creatinine, Ser 2.86 (*)    Albumin 3.3 (*)    AST 1,428 (*)    ALT 1,139 (*)    Total Bilirubin 2.0 (*)    GFR, Estimated 22 (*)    Anion gap 17 (*)    All other components within normal limits  LACTIC ACID, PLASMA - Abnormal; Notable for the following components:   Lactic Acid, Venous 5.0 (*)    All other components within normal limits  CBC WITH DIFFERENTIAL/PLATELET - Abnormal; Notable for the following components:   WBC 11.5 (*)  RBC 3.91 (*)    Hemoglobin 10.4 (*)    HCT 34.2 (*)    RDW 18.3 (*)    nRBC 0.4 (*)    Neutro Abs 9.4 (*)    All other components within normal limits  BLOOD GAS, VENOUS - Abnormal; Notable for the following components:   pO2, Ven <31 (*)    Acid-base deficit 2.2 (*)    All other components within normal limits  PROTIME-INR - Abnormal; Notable for the following components:   Prothrombin Time 26.5 (*)    INR 2.4 (*)    All other components within normal limits  TROPONIN I (HIGH SENSITIVITY) - Abnormal; Notable for the following components:   Troponin I (High Sensitivity) 395 (*)    All other components within normal limits  RESP PANEL BY RT-PCR (RSV, FLU A&B, COVID)  RVPGX2  CULTURE, BLOOD (ROUTINE X 2)  CULTURE, BLOOD (ROUTINE X 2)  ACID FAST SMEAR (AFB, MYCOBACTERIA)  ACID FAST CULTURE WITH REFLEXED SENSITIVITIES (MYCOBACTERIA)  EXPECTORATED SPUTUM ASSESSMENT W GRAM STAIN, RFLX TO RESP C  MRSA NEXT GEN BY PCR, NASAL  BRAIN NATRIURETIC PEPTIDE  LACTIC ACID, PLASMA  HEPATITIS PANEL, ACUTE  ACETAMINOPHEN LEVEL  CBG  MONITORING, ED  TROPONIN I (HIGH SENSITIVITY)     EKG  Sinus rhythm with PVCs, rate of 81, normal QRS, normal QTc, no ischemic ST elevation, T wave flattening in 1, T wave inversion in aVL, not significant change compared to prior   RADIOLOGY Chest x-ray on my independent interpretation showed bilateral patchy reticulations   PROCEDURES:  Critical Care performed: Yes, see critical care procedure note(s)  .Critical Care  Performed by: Claybon Jabs, MD Authorized by: Claybon Jabs, MD   Critical care provider statement:    Critical care time (minutes):  45   Critical care was necessary to treat or prevent imminent or life-threatening deterioration of the following conditions:  Respiratory failure, metabolic crisis and endocrine crisis   Critical care was time spent personally by me on the following activities:  Development of treatment plan with patient or surrogate, discussions with consultants, evaluation of patient's response to treatment, examination of patient, ordering and review of laboratory studies, ordering and review of radiographic studies, ordering and performing treatments and interventions, pulse oximetry, re-evaluation of patient's condition and review of old charts Ultrasound ED Echo  Date/Time: 06/07/2023 3:59 PM  Performed by: Claybon Jabs, MD Authorized by: Claybon Jabs, MD   Procedure details:    Indications: dyspnea     Views: subxiphoid and IVC view     Images: not archived   Findings:    Pericardium: no pericardial effusion     LV Function: severly depressed (<30%)     RV Diameter: normal     IVC: dilated   Impression:    Impression comment:  Fluid intolerant IVC, diminished contractility, no evidence of RV strain, no obvious pericardial effusion. Ultrasound ED Thoracic  Date/Time: 06/07/2023 3:59 PM  Performed by: Claybon Jabs, MD Authorized by: Claybon Jabs, MD   Procedure details:    Indications: dyspnea     Left lung pleural:  Visualized    Right lung pleural:  Visualized   Images: not archived   Right Lung Findings:     right lung sliding    right lung pleural effusion      Left Lung Findings:     left lung sliding    left lung pleural effusion Impression:    Impression comment:  Bilateral B-lines, bilateral pleural effusions, possible consolidation at the right lung base laterally.    MEDICATIONS ORDERED IN ED: Medications  vancomycin (VANCOREADY) IVPB 1500 mg/300 mL (1,500 mg Intravenous New Bag/Given 06/07/23 1628)  furosemide (LASIX) injection 40 mg (40 mg Intravenous Given 06/07/23 1550)  piperacillin-tazobactam (ZOSYN) IVPB 3.375 g (0 g Intravenous Stopped 06/07/23 1647)  dextrose (D10W) 10% bolus 250 mL (0 mLs Intravenous Stopped 06/07/23 1738)     IMPRESSION / MDM / ASSESSMENT AND PLAN / ED COURSE  I reviewed the triage vital signs and the nursing notes.                              Differential diagnosis includes, but is not limited to, CHF exacerbation, ACS, sepsis, pneumonia, viral infection, MAI recurrence, consider PE but patient is on Eliquis and states compliance, no evidence of RV enlargement this time on echo.  Will get labs, EKG, troponin, chest x-ray.  Will give him 40 mg IV Lasix.  Bedside ultrasound with evidence of volume overload, IVC is plethoric.  Transition from CPAP to BiPAP.  He will need to be admitted for further management.  Patient's presentation is most consistent with acute presentation with potential threat to life or bodily function.  Independent review of labs and imaging, as well as clinical course of below.  Given overall clinical picture, patient is at high risk and will need to be admitted for further management.  Consulted ICU who is agreeable plan for admission and will evaluate the patient.  He is admitted.  Clinical Course as of 06/07/23 1754  Mon Jun 07, 2023  1645 DG Chest Portable 1 View IMPRESSION: 1. Stable findings of chronic bronchiectasis and bilateral  scarring, previously characterized as sequela of chronic indolent infection such as an ARI. 2. Trace bilateral pleural effusions. 3. No acute airspace disease.   [TT]  1657 Independent review of labs mild leukocytosis, lactate is 5, could be from his volume overload and CHF versus sepsis, he has already been given broad-spectrum antibiotics, respiratory viral panel is negative, troponin is elevated, he is no chest pain at this time, suspect this might be due to demand, his creatinine is elevated, glucose is low, will give him some dextrose, otherwise rest of electrolytes not severely deranged.  His AST and ALT are quite elevated, could be from congestion, he has no right upper quadrant tenderness or jaundice, but will also get a right upper quadrant ultrasound and sent for acute hepatitis panel.  Unable to give fluids at this time given his fluid overloaded state. [TT]  1702 Blood pressures, heart rate improving with Lasix and BiPAP. [TT]  1720 Consult to hospitalist who would like ICU to admit the patient given his multiple issues.  Reach out ICU and awaiting hear back. [TT]  1726 Consulted ICU and they will come down to evaluate him. [TT]    Clinical Course User Index [TT] Jodie Echevaria, Franchot Erichsen, MD     FINAL CLINICAL IMPRESSION(S) / ED DIAGNOSES   Final diagnoses:  Acute on chronic congestive heart failure, unspecified heart failure type (HCC)  Acute hypoxemic respiratory failure (HCC)  History of MAI infection  History of bronchiectasis  Cough, unspecified type  Shortness of breath  Transaminitis  Elevated troponin  AKI (acute kidney injury) (HCC)     Rx / DC Orders   ED Discharge Orders     None        Note:  This document  was prepared using Conservation officer, historic buildings and may include unintentional dictation errors.    Claybon Jabs, MD 06/07/23 [phone]

## 2023-06-08 ENCOUNTER — Inpatient Hospital Stay (HOSPITAL_COMMUNITY)
Admit: 2023-06-08 | Discharge: 2023-06-08 | Disposition: A | Discharge status: Home / Self Care | Attending: Student in an Organized Health Care Education/Training Program

## 2023-06-08 ENCOUNTER — Inpatient Hospital Stay

## 2023-06-08 ENCOUNTER — Encounter

## 2023-06-08 DIAGNOSIS — R0609 Other forms of dyspnea: Secondary | ICD-10-CM | POA: Diagnosis not present

## 2023-06-08 DIAGNOSIS — J479 Bronchiectasis, uncomplicated: Secondary | ICD-10-CM | POA: Diagnosis not present

## 2023-06-08 DIAGNOSIS — N179 Acute kidney failure, unspecified: Secondary | ICD-10-CM | POA: Diagnosis not present

## 2023-06-08 DIAGNOSIS — R570 Cardiogenic shock: Secondary | ICD-10-CM

## 2023-06-08 DIAGNOSIS — I5023 Acute on chronic systolic (congestive) heart failure: Secondary | ICD-10-CM

## 2023-06-08 DIAGNOSIS — J9601 Acute respiratory failure with hypoxia: Secondary | ICD-10-CM | POA: Diagnosis not present

## 2023-06-08 DIAGNOSIS — N183 Chronic kidney disease, stage 3 unspecified: Secondary | ICD-10-CM

## 2023-06-08 LAB — GLUCOSE, CAPILLARY
Glucose-Capillary: 105 mg/dL — ABNORMAL HIGH (ref 70–99)
Glucose-Capillary: 117 mg/dL — ABNORMAL HIGH (ref 70–99)
Glucose-Capillary: 136 mg/dL — ABNORMAL HIGH (ref 70–99)
Glucose-Capillary: 156 mg/dL — ABNORMAL HIGH (ref 70–99)
Glucose-Capillary: 67 mg/dL — ABNORMAL LOW (ref 70–99)
Glucose-Capillary: 69 mg/dL — ABNORMAL LOW (ref 70–99)
Glucose-Capillary: 77 mg/dL (ref 70–99)
Glucose-Capillary: 87 mg/dL (ref 70–99)
Glucose-Capillary: 87 mg/dL (ref 70–99)

## 2023-06-08 LAB — BLOOD CULTURE ID PANEL (REFLEXED) - BCID2

## 2023-06-08 LAB — CBC WITH DIFFERENTIAL/PLATELET
Abs Immature Granulocytes: 0.07 10*3/uL (ref 0.00–0.07)
Basophils Absolute: 0 10*3/uL (ref 0.0–0.1)
Basophils Relative: 0 %
Eosinophils Absolute: 0 10*3/uL (ref 0.0–0.5)
Eosinophils Relative: 0 %
HCT: 31.5 % — ABNORMAL LOW (ref 39.0–52.0)
Hemoglobin: 10 g/dL — ABNORMAL LOW (ref 13.0–17.0)
Immature Granulocytes: 1 %
Lymphocytes Relative: 9 %
Lymphs Abs: 1 10*3/uL (ref 0.7–4.0)
MCH: 26.7 pg (ref 26.0–34.0)
MCHC: 31.7 g/dL (ref 30.0–36.0)
MCV: 84 fL (ref 80.0–100.0)
Monocytes Absolute: 0.9 10*3/uL (ref 0.1–1.0)
Monocytes Relative: 8 %
Neutro Abs: 9.6 10*3/uL — ABNORMAL HIGH (ref 1.7–7.7)
Neutrophils Relative %: 82 %
Platelets: 258 10*3/uL (ref 150–400)
RBC: 3.75 MIL/uL — ABNORMAL LOW (ref 4.22–5.81)
RDW: 18.2 % — ABNORMAL HIGH (ref 11.5–15.5)
WBC: 11.6 10*3/uL — ABNORMAL HIGH (ref 4.0–10.5)
nRBC: 0.3 % — ABNORMAL HIGH (ref 0.0–0.2)

## 2023-06-08 LAB — COOXEMETRY PANEL
Carboxyhemoglobin: 1.8 % — ABNORMAL HIGH (ref 0.5–1.5)
Methemoglobin: <0.7 % (ref 0.0–1.5)
O2 Saturation: 83.1 %
Total hemoglobin: 10.2 g/dL — ABNORMAL LOW (ref 12.0–16.0)
Total oxygen content: 81.4 %

## 2023-06-08 LAB — COMPREHENSIVE METABOLIC PANEL WITH GFR
ALT: 1235 U/L — ABNORMAL HIGH (ref 0–44)
AST: 1424 U/L — ABNORMAL HIGH (ref 15–41)
Albumin: 3.3 g/dL — ABNORMAL LOW (ref 3.5–5.0)
Alkaline Phosphatase: 66 U/L (ref 38–126)
Anion gap: 16 — ABNORMAL HIGH (ref 5–15)
BUN: 76 mg/dL — ABNORMAL HIGH (ref 8–23)
CO2: 22 mmol/L (ref 22–32)
Calcium: 8.7 mg/dL — ABNORMAL LOW (ref 8.9–10.3)
Chloride: 102 mmol/L (ref 98–111)
Creatinine, Ser: 2.99 mg/dL — ABNORMAL HIGH (ref 0.61–1.24)
GFR, Estimated: 20 mL/min — ABNORMAL LOW (ref >60)
Glucose, Bld: 95 mg/dL (ref 70–99)
Potassium: 4.9 mmol/L (ref 3.5–5.1)
Sodium: 140 mmol/L (ref 135–145)
Total Bilirubin: 1.9 mg/dL — ABNORMAL HIGH (ref 0.0–1.2)
Total Protein: 6.6 g/dL (ref 6.5–8.1)

## 2023-06-08 LAB — ECHOCARDIOGRAM COMPLETE
AR max vel: 3.09 cm2
AV Area VTI: 2.98 cm2
AV Area mean vel: 2.49 cm2
AV Mean grad: 2 mmHg
AV Peak grad: 4.3 mmHg
Ao pk vel: 1.04 m/s
Area-P 1/2: 7.02 cm2
Est EF: <20
Height: 70 in
MV VTI: 1.51 cm2
S' Lateral: 5.1 cm
Weight: 2486.79 [oz_av]

## 2023-06-08 LAB — POTASSIUM
Potassium: 4.9 mmol/L (ref 3.5–5.1)
Potassium: 4.8 mmol/L (ref 3.5–5.1)

## 2023-06-08 LAB — RENAL FUNCTION PANEL
Albumin: 3.1 g/dL — ABNORMAL LOW (ref 3.5–5.0)
Anion gap: 16 — ABNORMAL HIGH (ref 5–15)
BUN: 76 mg/dL — ABNORMAL HIGH (ref 8–23)
CO2: 23 mmol/L (ref 22–32)
Calcium: 8.4 mg/dL — ABNORMAL LOW (ref 8.9–10.3)
Chloride: 102 mmol/L (ref 98–111)
Creatinine, Ser: 3 mg/dL — ABNORMAL HIGH (ref 0.61–1.24)
GFR, Estimated: 20 mL/min — ABNORMAL LOW (ref >60)
Glucose, Bld: 94 mg/dL (ref 70–99)
Phosphorus: 6.9 mg/dL — ABNORMAL HIGH (ref 2.5–4.6)
Potassium: 4.8 mmol/L (ref 3.5–5.1)
Sodium: 141 mmol/L (ref 135–145)

## 2023-06-08 LAB — LACTIC ACID, PLASMA
Lactic Acid, Venous: 2.9 mmol/L (ref 0.5–1.9)
Lactic Acid, Venous: 3.5 mmol/L (ref 0.5–1.9)
Lactic Acid, Venous: 3.9 mmol/L (ref 0.5–1.9)

## 2023-06-08 LAB — MAGNESIUM: Magnesium: 2.6 mg/dL — ABNORMAL HIGH (ref 1.7–2.4)

## 2023-06-08 LAB — HEPARIN LEVEL (UNFRACTIONATED): Heparin Unfractionated: 0.44 [IU]/mL (ref 0.30–0.70)

## 2023-06-08 LAB — APTT
aPTT: 45 s — ABNORMAL HIGH (ref 24–36)
aPTT: 37 s — ABNORMAL HIGH (ref 24–36)

## 2023-06-08 LAB — TROPONIN I (HIGH SENSITIVITY): Troponin I (High Sensitivity): 404 ng/L (ref <18)

## 2023-06-08 MED ORDER — DEXTROSE 50 % IV SOLN
INTRAVENOUS | Status: AC
Start: 2023-06-08 — End: 2023-06-08
  Filled 2023-06-08: qty 50

## 2023-06-08 MED ORDER — MILRINONE LACTATE IN DEXTROSE 20-5 MG/100ML-% IV SOLN
0.1250 ug/kg/min | INTRAVENOUS | Status: DC
  Administered 2023-06-08 – 2023-06-09 (×2): 0.25 ug/kg/min via INTRAVENOUS
  Filled 2023-06-08 (×2): qty 100

## 2023-06-08 MED ORDER — AMIODARONE HCL IN DEXTROSE 360-4.14 MG/200ML-% IV SOLN
30.0000 mg/h | INTRAVENOUS | Status: AC
Start: 2023-06-08 — End: 2023-06-08
  Administered 2023-06-08: 30 mg/h via INTRAVENOUS
  Filled 2023-06-08: qty 200

## 2023-06-08 MED ORDER — DEXTROSE 50 % IV SOLN
INTRAVENOUS | Status: AC
  Filled 2023-06-08: qty 50

## 2023-06-08 MED ORDER — CHLORHEXIDINE GLUCONATE CLOTH 2 % EX PADS
6.0000 | MEDICATED_PAD | Freq: Every day | CUTANEOUS | Status: DC
  Administered 2023-06-08 – 2023-06-10 (×2): 6 via TOPICAL

## 2023-06-08 MED ORDER — AMIODARONE HCL IN DEXTROSE 360-4.14 MG/200ML-% IV SOLN
30.0000 mg/h | INTRAVENOUS | Status: DC
  Administered 2023-06-08 – 2023-06-10 (×4): 30 mg/h via INTRAVENOUS
  Filled 2023-06-08 (×3): qty 200

## 2023-06-08 MED ORDER — MORPHINE SULFATE (PF) 2 MG/ML IV SOLN
1.0000 mg | INTRAVENOUS | Status: DC | PRN
  Administered 2023-06-08 (×2): 1 mg via INTRAVENOUS
  Filled 2023-06-08 (×2): qty 1

## 2023-06-08 MED ORDER — PERFLUTREN LIPID MICROSPHERE
1.0000 mL | INTRAVENOUS | Status: AC | PRN
  Administered 2023-06-08: 5 mL via INTRAVENOUS

## 2023-06-08 MED ORDER — DEXTROSE 50 % IV SOLN
12.5000 g | INTRAVENOUS | Status: AC
  Administered 2023-06-08: 12.5 g via INTRAVENOUS

## 2023-06-08 MED ORDER — FENTANYL CITRATE PF 50 MCG/ML IJ SOSY: 25.0000 ug | PREFILLED_SYRINGE | Freq: Once | INTRAMUSCULAR | Status: DC

## 2023-06-08 MED ORDER — FUROSEMIDE 10 MG/ML IJ SOLN
80.0000 mg | Freq: Once | INTRAMUSCULAR | Status: AC
  Administered 2023-06-08: 80 mg via INTRAVENOUS
  Filled 2023-06-08: qty 8

## 2023-06-08 NOTE — IPAL (Signed)
  Interdisciplinary Goals of Care Family Meeting   Date carried out: 06/08/2023  Location of the meeting: Bedside  Member's involved: Nurse Practitioner, Bedside Registered Nurse, and Family Member or next of kin  Durable Power of Attorney or acting medical decision maker: Pt's wife    Discussion: We discussed goals of care for Nicholas Alvarez .  Nursing reports that's pt's wife is requesting to change code status to DNR/DNI.  Followed up with pt's wife at bedside.  She confirms her and her husband have had discussions regarding code status, and he would NEVER want to be placed on ventilator, nor have CPR.  Will continue with current aggressive interventions and to treat the treatable.  Now DNR/DNI status.  Code status:   Code Status: Limited: Do not attempt resuscitation (DNR) -DNR-LIMITED -Do Not Intubate/DNI    Disposition: Continue current acute care  Time spent for the meeting: 10 minutes   Nicholas Alvarez, AGACNP-BC [facility] Prefer epic messenger for cross cover needs If after hours, please call E-link  Judithe Modest, NP  06/08/2023, 12:36 PM

## 2023-06-08 NOTE — Progress Notes (Signed)
*  PRELIMINARY RESULTS* Echocardiogram 2D Echocardiogram has been performed.  Nicholas Alvarez 06/08/2023, 10:00 AM

## 2023-06-08 NOTE — Progress Notes (Signed)
 NAME:  Nicholas Alvarez, MRN:  [id number], DOB:  1940/08/14, LOS: 1 ADMISSION DATE:  06/07/2023,  CHIEF COMPLAINT:  Cardiogenic Shock   History of Present Illness:   Patient is an 83 year old male presenting to the hospital with increased shortness of breath and altered mental status.  He was altered at the time of our interview and most of the history was obtained from his wife.   Patient's wife reports symptoms of increased fatigue and shortness of breath for the past couple of weeks. He has been unable to fall asleep laying flat, and has been sleeping in a recliner. He's also reported decreased appetite over the past week which is unusual for him. Wife also reports significant lower extremity edema which is similarly unusual for him. No report of chest pain or arm numbness recently, and no similar symptoms in the past (no signs of heart failure). Patient's wife does report he's been eating more frozen dinners recently. They did have a visiting nurse see him and he was noted to be significantly hypotensive, prompting presentation to the ED for an evaluation.   In the ED, he was hypoxic and hypotensive. Blood work showed an elevated troponin, and significantly elevated AST/ALT. Lactic acid was initially elevated at 5.0. Bedside POCUS by EDP showed a dilated IVC, and diffuse b-lines in all lung fields. He received IV antibiotics as well as IV furosemide and was started on BiPAP for respiratory support with improvement in BP and hemodynamics. Repeat lactic acid improved to 4.4. PCCM is consulted to admit.    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 ILD  Significant Hospital Events: Including procedures, antibiotic start and stop dates in addition to other pertinent events   06/07/2023: presented to the ED, on BiPAP, admit to ICU. 40 of IV furosemide administered. 06/08/2023: continues on BiPAP, TTE performed, on  furosemide gtt   Interim History / Subjective:  Remains short of breath, on BiPAP. No pain reported but does have increased work of breathing.  Objective   Blood pressure 120/72, pulse 60, temperature 97.8 F (36.6 C), temperature source Axillary, resp. rate (!) 23, height 5\' 10"  (1.778 m), weight 70.5 kg, SpO2 98%.    FiO2 (%):  [30 %-40 %] 40 % Pressure Support:  [5 cmH20] 5 cmH20   Intake/Output Summary (Last 24 hours) at 06/08/2023 1742 Last data filed at 06/08/2023 1700 Gross per 24 hour  Intake 340.33 ml  Output 775 ml  Net -434.67 ml   Filed Weights   06/07/23 1536 06/08/23 0500  Weight: 71.2 kg 70.5 kg    Examination: Physical Exam Constitutional:      Appearance: He is ill-appearing.  Cardiovascular:     Rate and Rhythm: Normal rate and regular rhythm.     Pulses: Normal pulses.     Heart sounds: Normal heart sounds.  Pulmonary:     Breath sounds: Rales present. No wheezing.  Neurological:     General: No focal deficit present.     Mental Status: He is alert. Mental status is at baseline.      Assessment & Plan:   #Acute Hypoxic Respiratory Failure #Acute Decompensated Heart Failure with Reduced Ejection Fraction #Cardiogenic Shock #AKI #Elevated Transaminases #CAD s/p CABG #Chronic MAI infection #Bronchiectasis without flare   Neuro: Patient encephalopathic in the setting of decompensated heart failure and possibly superimposed infection. Blood gas did not show any CO2 narcosis. He did receive narcotics overnight for work  of breathing but we will try to avoid that moving forward unless necessary.  CV: Clinically in decompensated heart failure, previous TTE 10/2022 showed EF of 30% with wall motion abnormalities, patient was lost to follow up and not on GDMT. TTE today with severely depressed LVEF and dilated and dysfunctional RV. Overall picture consistent with cardiogenic shock, advanced heart failure consulted. Currently initiated on milrinone gtt and is  on furosemide gtt. Our ability to diurese is complicated by kidney function.             -continue milrinone gtt             -continue furosemide gtt             -CVC placed, check co-ox  -lactic acid downtrending          Pulm: Respiratory failure secondary to pulmonary edema with bilateral pleural effusions on EDP POCUS. He's had bronchiectasis secondary to MAI, with concern for ILD in the past as well. I have personally reviewed his chest CT's and note the bronchiectasis and findings that are consistent with atypical mycobacterial infections. I don't suspect this process to be the driver behind his current presentation. 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 Other groups) -continue BiPAP -aggressive diuresis GI: elevated AST and ALT in the setting of statin use as well as decompensated heart failure. Holding statins and hepatotoxins and will trend LFT's Renal: AKI in the setting of decompensated heart failure and decreased PO intake. His kidney function is normal at baseline, and pre-renal azotemia, cardio-renal syndrome, and drug induced nephrotoxicity are all on the differential. Renal ultrasound unremarkable. Nephrology consulted and recs appreciated, patient unlikely to be a good candidate for renal replacement therapy. Endo: ICU glycemic protocol Hem/Onc: On apixaban for treatment of previous VTE, switched to heparin. ID: history of MAI, treated with ethambutol and azithromycin (rifampin held due to hepatotoxicity). This presentation is not consistent with a pneumonia or an overt infection. Received broad spectrum antibiotics in the ED, will hold off on further antibiotics for the time being. Blood culture with staph epi in one bottle, most likely a contaminant.  Best Practice (right click and "Reselect all  SmartList Selections" daily)   Diet/type: NPO DVT prophylaxis systemic heparin Pressure ulcer(s): N/A GI prophylaxis: N/A Lines: Central line and yes and it is still needed Foley:  N/A Code Status:  full code Last date of multidisciplinary goals of care discussion [06/08/2023]  Labs   CBC: Recent Labs  Lab 06/07/23 1542 06/08/23 0413  WBC 11.5* 11.6*  NEUTROABS 9.4* 9.6*  HGB 10.4* 10.0*  HCT 34.2* 31.5*  MCV 87.5 84.0  PLT 304 258    Basic Metabolic Panel: Recent Labs  Lab 06/07/23 1542 06/07/23 2139 06/08/23 0214 06/08/23 0413 06/08/23 0549 06/08/23 1004  NA 140 136  --  140 141  --   K 5.1 5.0 4.9 4.9 4.8 4.8  CL 100 101  --  102 102  --   CO2 23 19*  --  22 23  --   GLUCOSE 43* 95  --  95 94  --   BUN 70* 71*  --  76* 76*  --   CREATININE 2.86* 2.89*  --  2.99* 3.00*  --   CALCIUM 9.1 8.7*  --  8.7* 8.4*  --   MG  --  2.3  --  2.6*  --   --   PHOS  --  6.8*  --   --  6.9*  --    GFR: Estimated Creatinine Clearance: 19.6 mL/min (A) (by C-G formula based on SCr of 3 mg/dL (H)). Recent Labs  Lab 06/07/23 1542 06/07/23 1730 06/07/23 1953 06/07/23 2333 06/08/23 0214 06/08/23 0413 06/08/23 0549  WBC 11.5*  --   --   --   --  11.6*  --   LATICACIDVEN 5.0*   < > 3.9* 3.9* 3.5*  --  2.9*   < > = values in this interval not displayed.    Liver Function Tests: Recent Labs  Lab 06/07/23 1542 06/07/23 2139 06/08/23 0413 06/08/23 0549  AST 1,428* 1,298* 1,424*  --   ALT 1,139* 1,092* 1,235*  --   ALKPHOS 71 66 66  --   BILITOT 2.0* 1.7* 1.9*  --   PROT 7.0 6.2* 6.6  --   ALBUMIN 3.3* 3.0* 3.3* 3.1*   No results for input(s): "LIPASE", "AMYLASE" in the last 168 hours. No results for input(s): "AMMONIA" in the last 168 hours.  ABG    Component Value Date/Time   PHART 7.35 06/07/2023 2212   PCO2ART 34 06/07/2023 2212   PO2ART 168 (H) 06/07/2023 2212   HCO3 18.8 (L) 06/07/2023 2212   TCO2 21 03/12/2008 1953   ACIDBASEDEF 6.0 (H) 06/07/2023 2212    O2SAT 83.1 06/08/2023 1559     Coagulation Profile: Recent Labs  Lab 06/07/23 1542  INR 2.4*    Cardiac Enzymes: No results for input(s): "CKTOTAL", "CKMB", "CKMBINDEX", "TROPONINI" in the last 168 hours.  HbA1C: Hgb A1c MFr Bld  Date/Time Value Ref Range Status  03/07/2008 08:00 PM  4.6 - 6.1 % Final   5.9 (NOTE)   The ADA recommends the following therapeutic goal for glycemic   control related to Hgb A1C measurement:   Goal of Therapy:   < 7.0% Hgb A1C   Reference: American Diabetes Association: Clinical Practice   Recommendations 2008, Diabetes Care,  2008, 31:(Suppl 1).    CBG: Recent Labs  Lab 06/08/23 0310 06/08/23 0413 06/08/23 1124 06/08/23 1703 06/08/23 1740  GLUCAP 105* 87 77 67* 69*    Review of Systems:   N/A  Past Medical History:  He,  has a past medical history of Acute hypoxemic respiratory failure (HCC), BPH (benign prostatic hyperplasia), Bronchiectasis (HCC), CAD (coronary artery disease), Chronic anemia, COVID-19, Depression, Dyslipidemia, Dyspnea, ED (erectile dysfunction), Elevated PSA, Falls, Gross hematuria, History of methicillin resistant staphylococcus aureus (MRSA) (02/2022), hypotension, Hypertension, Interstitial lung disease (HCC), Ischemic cardiomyopathy, Left ventricular aneurysm, 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   Surgical History:   Past Surgical History:  Procedure Laterality Date   BRONCHIAL WASHINGS N/A 01/16/2022   Procedure: BRONCHIAL WASHINGS;  Surgeon: Vida Rigger, MD;  Location: [facility] ORS;  Service: Thoracic;  Laterality: N/A;   CARDIAC CATHETERIZATION  03/2008   COLONOSCOPY     CORONARY ARTERY BYPASS GRAFT  03/12/2008   HOLEP-LASER ENUCLEATION OF THE PROSTATE WITH MORCELLATION N/A 10/12/2022   Procedure: HOLEP-LASER  ENUCLEATION OF THE PROSTATE WITH MORCELLATION;  Surgeon: Vanna Scotland, MD;  Location: [facility] ORS;  Service: Urology;  Laterality: N/A;   PROSTATE BIOPSY  11/2009   RIGID BRONCHOSCOPY N/A 01/16/2022   Procedure: RIGID BRONCHOSCOPY;  Surgeon: Vida Rigger, MD;  Location: [facility] ORS;  Service: Thoracic;  Laterality: N/A;     Social History:   reports that he has quit smoking. His smoking use included cigarettes. He has been exposed to tobacco smoke. He has never used smokeless tobacco. He reports that he does not currently use alcohol after a past usage of about 12.0 standard drinks of alcohol per week. He reports that he does not use drugs.   Family History:  His family history includes COPD in his mother; Cancer in his sister; Coronary artery disease in an other family member; Heart attack (age of onset: 21) in his maternal grandfather and paternal grandfather; Heart attack (age of onset: 21) in his sister; Pneumonia in his father.   Allergies Allergies  Allergen Reactions   Ace Inhibitors Cough     Home Medications  Prior to Admission medications   Medication Sig Start Date End Date Taking? Authorizing Provider  albuterol (ACCUNEB) 1.25 MG/3ML nebulizer solution Take 1 ampule by nebulization daily. Use mixed with Pulmicort 0.25mg  daily. 05/29/23  Yes [provider]  albuterol (VENTOLIN HFA) 108 (90 Base) MCG/ACT inhaler Inhale 2 puffs into the lungs every 4 (four) hours as needed for shortness of breath. 05/03/23  Yes [provider]  apixaban (ELIQUIS) 5 MG TABS tablet Take 2 tablets (10 mg total) by mouth 2 (two) times daily for 6 days, THEN 1 tablet (5 mg total) 2 (two) times daily. 11/03/22 06/07/23 Yes Alexander, Dorene Grebe, DO  Apoaequorin (PREVAGEN PO) Take 1 capsule by mouth daily.   Yes [provider]  azithromycin (ZITHROMAX) 500 MG tablet Take 500 mg by mouth as directed. Take 1 tablet 3 times a week on Monday, Wednesday and Friday. 05/12/23 05/11/24 Yes  [provider]  budesonide (PULMICORT) 0.25 MG/2ML nebulizer solution Inhale 0.25 mg into the lungs daily. 03/18/23  Yes [provider]  diltiazem (CARDIZEM CD) 180 MG 24 hr capsule Take 180 mg by mouth daily. 03/31/23  Yes [provider]  DULoxetine (CYMBALTA) 60 MG capsule Take 60 mg by mouth daily. 11/12/22 11/12/23 Yes [provider]  ethambutol (MYAMBUTOL) 400 MG tablet Take 1,800 mg by mouth 3 (three) times a week. M, W, F-takes 4.5 tablets   Yes [provider]  feeding supplement (ENSURE ENLIVE / ENSURE PLUS) LIQD Take 237 mLs by mouth 3 (three) times daily between meals. 11/03/22  Yes Sunnie Nielsen, DO  finasteride (PROSCAR) 5 MG tablet Take 5 mg by mouth every morning.   Yes [provider]  mirtazapine (REMERON) 15 MG tablet Take 1 tablet by mouth at bedtime. 07/16/22 07/16/23 Yes [provider]  Multiple Vitamin (MULTIVITAMIN WITH MINERALS) TABS tablet Take 1 tablet by mouth daily. 11/04/22  Yes Sunnie Nielsen, DO  rifampin (RIFADIN) 300 MG capsule Take 600 mg by mouth 3 (three) times a week. M, W, F   Yes [provider]  simvastatin (ZOCOR) 40 MG tablet Take 40 mg by mouth every morning.   Yes [provider]  Spacer/Aero-Holding Chambers (EASIVENT) inhaler 1 each by Other route See admin instructions. 05/03/23 05/02/24 Yes [provider]  torsemide (DEMADEX) 20 MG tablet Take 20 mg by mouth daily. 05/03/23  Yes [provider]     Critical care time: 58 minutes    Raechel Chute, MD [facility] 06/08/2023 5:59 PM

## 2023-06-08 NOTE — Consult Note (Addendum)
 Advanced Heart Failure Team Consult Note   Primary Physician: Kandyce Rud, MD Cardiologist:  None  Reason for Consultation: Cardiogenic shock  HPI:    Nicholas Alvarez is seen today for evaluation of cardiogenic shock at the request of Dr. Aundria Rud.   83 y.o. with history of CAD s/p CABG, ischemic cardiomyopathy, bronchiectasis with chronic MAI infection, Sjogren's syndrome, prior PE on apixaban, and CKD stage 3 was admitted with shock and dyspnea.  Patient had an out of hospital anterior MI in 12/09 then had CABG in 1/10 with LIMA-LAD, SVG-OM1, sequential SVG-OM2 and dLCx, SVG-PDA.  Echo at the time of CABG showed EF 30%.  Patient was lost to cardiology followup, not seen by cardiology for > 10 yrs but had appointment with Dr. Azucena Cecil upcoming.  Patient has been found to have Sjogren's syndrome and has been on mycophenolate. He has bronchiectasis and has been found to have chronic MAI infection, on ethambutol, rifamipin and azithromycin chronically and followed by pulmonary.  He has a history of PE and has been on apixaban.  Last echo prior to this admission was in 8/24, EF 30-35% at that time.  Most of history comes from the patient's wife, he is currently on Bipap.  She says that he has been more short of breath for at least 3 months.  He is not very active at baseline but used to take a short walk outside daily. For the last 2-3 weeks, he has not walked much at all.  He has generally been in his recliner.  He has had progressive exertional dyspnea and peripheral edema.  He has been orthopneic, sleeps in recliner.  No chest pain.  He came to the ER on 3/31 with dyspnea and altered mental status.  He was started on Bipap.  Lacate initially 5, most recently 2.9.  Initially hypotensive, now BP stable and not on pressors.  AKI with creatinine up to 3 from baseline 1.4.  LFTs elevated.  BNP 2985. He is in NSR currently but had run of SVT rate 150 yesterday.  HS-TnI 395 => 404. He has been  started on vancomycin/Zosyn and Lasix gtt 10 mg/hr.  Blood cultures with S epidermidis in 1 bottle, ?contaminant.   I reviewed the echo done this admission, it shows EF < 20% with no LV thrombus, mild LV dilation and mild LVH, moderate RV enlargement with severe RV dysfunction, mild-moderate MR.   Home Medications Prior to Admission medications   Medication Sig Start Date End Date Taking? Authorizing Provider  albuterol (ACCUNEB) 1.25 MG/3ML nebulizer solution Take 1 ampule by nebulization daily. Use mixed with Pulmicort 0.25mg  daily. 05/29/23  Yes [provider]  albuterol (VENTOLIN HFA) 108 (90 Base) MCG/ACT inhaler Inhale 2 puffs into the lungs every 4 (four) hours as needed for shortness of breath. 05/03/23  Yes [provider]  apixaban (ELIQUIS) 5 MG TABS tablet Take 2 tablets (10 mg total) by mouth 2 (two) times daily for 6 days, THEN 1 tablet (5 mg total) 2 (two) times daily. 11/03/22 06/07/23 Yes Alexander, Dorene Grebe, DO  Apoaequorin (PREVAGEN PO) Take 1 capsule by mouth daily.   Yes [provider]  azithromycin (ZITHROMAX) 500 MG tablet Take 500 mg by mouth as directed. Take 1 tablet 3 times a week on Monday, Wednesday and Friday. 05/12/23 05/11/24 Yes [provider]  budesonide (PULMICORT) 0.25 MG/2ML nebulizer solution Inhale 0.25 mg into the lungs daily. 03/18/23  Yes [provider]  diltiazem (CARDIZEM CD) 180 MG 24  hr capsule Take 180 mg by mouth daily. 03/31/23  Yes [provider]  DULoxetine (CYMBALTA) 60 MG capsule Take 60 mg by mouth daily. 11/12/22 11/12/23 Yes [provider]  ethambutol (MYAMBUTOL) 400 MG tablet Take 1,800 mg by mouth 3 (three) times a week. M, W, F-takes 4.5 tablets   Yes [provider]  feeding supplement (ENSURE ENLIVE / ENSURE PLUS) LIQD Take 237 mLs by mouth 3 (three) times daily between meals. 11/03/22  Yes Sunnie Nielsen, DO  finasteride (PROSCAR) 5 MG tablet Take 5 mg by mouth every  morning.   Yes [provider]  mirtazapine (REMERON) 15 MG tablet Take 1 tablet by mouth at bedtime. 07/16/22 07/16/23 Yes [provider]  Multiple Vitamin (MULTIVITAMIN WITH MINERALS) TABS tablet Take 1 tablet by mouth daily. 11/04/22  Yes Sunnie Nielsen, DO  rifampin (RIFADIN) 300 MG capsule Take 600 mg by mouth 3 (three) times a week. M, W, F   Yes [provider]  simvastatin (ZOCOR) 40 MG tablet Take 40 mg by mouth every morning.   Yes [provider]  Spacer/Aero-Holding Chambers (EASIVENT) inhaler 1 each by Other route See admin instructions. 05/03/23 05/02/24 Yes [provider]  torsemide (DEMADEX) 20 MG tablet Take 20 mg by mouth daily. 05/03/23  Yes [provider]    Past 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    Chronic anemia    COVID-19    Depression    Dyslipidemia    Dyspnea    ED (erectile dysfunction)    Elevated PSA    Falls    Gross hematuria    History of methicillin resistant staphylococcus aureus (MRSA) 02/2022   + PCR nasal swab   Hx of hypotension    was placed on midodrine and then taken off   Hypertension    Interstitial lung disease (HCC)    Ischemic cardiomyopathy    with left ventricular ejection fraction of 35%   Left ventricular aneurysm    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   Myocardial infarction (HCC) 2009   Pneumonia    Protein-calorie malnutrition, severe (HCC)    S/P CABG x 5 2010   Sepsis due to pneumonia (HCC)    Sjogren's syndrome (HCC)    lung involvment    Past Surgical History: Past Surgical History:  Procedure Laterality Date   BRONCHIAL WASHINGS N/A 01/16/2022   Procedure: BRONCHIAL WASHINGS;  Surgeon: Vida Rigger, MD;  Location: [facility];  Service: Thoracic;  Laterality: N/A;   CARDIAC CATHETERIZATION  03/2008   COLONOSCOPY     CORONARY ARTERY BYPASS GRAFT  03/12/2008   HOLEP-LASER ENUCLEATION OF THE PROSTATE WITH MORCELLATION N/A 10/12/2022   Procedure: HOLEP-LASER ENUCLEATION OF THE PROSTATE WITH MORCELLATION;  Surgeon: Vanna Scotland, MD;  Location: [facility] OR[facility];  Service: Urology;  Laterality: N/A;   PROSTATE BIOPSY  11/2009   RIGID BRONCHOSCOPY N/A 01/16/2022   Procedure: RIGID BRONCHOSCOPY;  Surgeon: Vida Rigger, MD;  Location: [facility] OR[facility];  Service: Thoracic;  Laterality: N/A;    Family History: Family History  Problem Relation Age of Onset   Coronary artery disease Other    Heart attack Sister 2   Heart attack Maternal Grandfather 40   Heart attack Paternal Grandfather 20   COPD  Mother    Pneumonia Father    Cancer Sister     Social History: Social History   Socioeconomic History   Marital status: Married    Spouse name: Not on file   Number of children: Not on file   Years of education: Not on file   Highest education level: Not on file  Occupational History   Not on file  Tobacco Use   Smoking status: Former    Types: Cigarettes    Passive exposure: Past   Smokeless tobacco: Never   Tobacco comments:    Quit around age 45  Vaping Use   Vaping status: Never Used  Substance and Sexual Activity   Alcohol use: Not Currently    Alcohol/week: 12.0 standard drinks of alcohol    Types: 12 Shots of liquor per week    Comment: occ   Drug use: No   Sexual activity: Not on file  Other Topics Concern   Not on file  Social History Narrative   Not on file   Social Drivers of Health   Financial Resource Strain: Patient Declined (09/21/2022)   Received from [facility]   Overall Financial Resource Strain (CARDIA)    Difficulty of Paying Living Expenses: Patient declined  Food Insecurity: No Food Insecurity (06/07/2023)   Hunger Vital Sign    Worried  About Running Out of Food in the Last Year: Never true    Ran Out of Food in the Last Year: Never true  Transportation Needs: No Transportation Needs (06/07/2023)   PRAPARE - Administrator, Civil Service (Medical): No    Lack of Transportation (Non-Medical): No  Physical Activity: Not on file  Stress: Not on file  Social Connections: Not on file    Allergies:  Allergies  Allergen Reactions   Ace Inhibitors Cough    Objective:    Vital Signs:   Temp:  [96.4 F (35.8 C)-97.8 F (36.6 C)] 97.8 F (36.6 C) (04/01 0800) Pulse Rate:  [36-151] 82 (04/01 0900) Resp:  [14-28] 17 (04/01 1200) BP: (90-128)/(37-96) 101/66 (04/01 1200) SpO2:  [85 %-100 %] 99 % (04/01 1100) FiO2 (%):  [30 %-40 %] 40 % (04/01 1100) Weight:  [70.5 kg-71.2 kg] 70.5 kg (04/01 0500) Last BM Date :  (PTA)  Weight change: Filed Weights   06/07/23 1536 06/08/23 0500  Weight: 71.2 kg 70.5 kg    Intake/Output:   Intake/Output Summary (Last 24 hours) at 06/08/2023 1225 Last data filed at 06/08/2023 1200 Gross per 24 hour  Intake 317.47 ml  Output 125 ml  Net 192.47 ml      Physical Exam    General:  Frail, on Bipap HEENT: normal Neck: supple. JVP 16 cm. Carotids 2+ bilat; no bruits. No lymphadenopathy or thyromegaly appreciated. Cor: PMI nondisplaced. Regular rate & rhythm. No rubs, gallops or murmurs. Lungs: Crackles at bases.  Abdomen: soft, nontender, nondistended. No hepatosplenomegaly. No bruits or masses. Good bowel sounds. Extremities: no cyanosis, clubbing, rash. 2+ edema to thighs.  Neuro: alert & orientedx3, cranial nerves grossly intact. moves all 4 extremities w/o difficulty. Affect pleasant   Telemetry   NSR with PACs (personally reviewed)  EKG    NSR with PACs (personally reviewed)  Labs   Basic Metabolic Panel: Recent Labs  Lab 06/07/23 1542 06/07/23 2139 06/08/23 0214 06/08/23 0413 06/08/23 0549 06/08/23 1004  NA 140 136  --  140 141  --   K 5.1 5.0 4.9  4.9 4.8 4.8  CL 100 101  --  102 102  --   CO2 23 19*  --  22 23  --   GLUCOSE 43* 95  --  95 94  --   BUN 70* 71*  --  76* 76*  --   CREATININE 2.86* 2.89*  --  2.99* 3.00*  --   CALCIUM 9.1 8.7*  --  8.7* 8.4*  --   MG  --  2.3  --  2.6*  --   --   PHOS  --  6.8*  --   --  6.9*  --     Liver Function Tests: Recent Labs  Lab 06/07/23 1542 06/07/23 2139 06/08/23 0413 06/08/23 0549  AST 1,428* 1,298* 1,424*  --   ALT 1,139* 1,092* 1,235*  --   ALKPHOS 71 66 66  --   BILITOT 2.0* 1.7* 1.9*  --   PROT 7.0 6.2* 6.6  --   ALBUMIN 3.3* 3.0* 3.3* 3.1*   No results for input(s): "LIPASE", "AMYLASE" in the last 168 hours. No results for input(s): "AMMONIA" in the last 168 hours.  CBC: Recent Labs  Lab 06/07/23 1542 06/08/23 0413  WBC 11.5* 11.6*  NEUTROABS 9.4* 9.6*  HGB 10.4* 10.0*  HCT 34.2* 31.5*  MCV 87.5 84.0  PLT 304 258    Cardiac Enzymes: No results for input(s): "CKTOTAL", "CKMB", "CKMBINDEX", "TROPONINI" in the last 168 hours.  BNP: BNP (last 3 results) Recent Labs    06/07/23 1542 06/07/23 1953  BNP 3,164.6* 2,985.2*    ProBNP (last 3 results) No results for input(s): "PROBNP" in the last 8760 hours.   CBG: Recent Labs  Lab 06/07/23 2321 06/08/23 0033 06/08/23 0310 06/08/23 0413 06/08/23 1124  GLUCAP 71 87 105* 87 77    Coagulation Studies: Recent Labs    06/07/23 1542  LABPROT 26.5*  INR 2.4*     Imaging   ECHOCARDIOGRAM COMPLETE Result Date: 06/08/2023    ECHOCARDIOGRAM REPORT   Patient Name:   LUISDANIEL KENTON [facility] Date of Exam: 06/08/2023 Medical Rec #:  [id number]      Height:       70.0 in Accession #:    [id number]     Weight:       155.4 lb Date of Birth:  March 01, 1941      BSA:          1.875 m Patient Age:    82 years       BP:           111/59 mmHg Patient Gender: M              HR:           91 bpm. Exam Location:  [facility] Procedure: 2D Echo, 3D Echo, Cardiac Doppler, Color Doppler and Intracardiac            Opacification Agent (Both  Spectral and Color Flow Doppler were            utilized during procedure). Indications:     Dyapnea  History:         Patient has prior history of Echocardiogram examinations, most                  recent 11/02/2022. CAD and Previous Myocardial Infarction, Prior                  CABG; Signs/Symptoms:Dyspnea. Pulmonary embolus.  Sonographer:     Mikki Harbor Referring Phys:  [id number] KHABIB DGAYLI Diagnosing  Phys: Yvonne Kendall MD IMPRESSIONS  1. Left ventricular ejection fraction, by estimation, is <20%. Left ventricular ejection fraction by 3D volume is 12 %. The left ventricle has severely decreased function. The left ventricle demonstrates global hypokinesis. The left ventricular internal  cavity size was mildly dilated. There is moderate left ventricular hypertrophy. Left ventricular diastolic parameters are consistent with Grade II diastolic dysfunction (pseudonormalization). Elevated left atrial pressure.  2. Right ventricular systolic function is severely reduced. The right ventricular size is moderately enlarged. There is severely elevated pulmonary artery systolic pressure.  3. Left atrial size was mildly dilated.  4. Right atrial size was mildly dilated.  5. Large pleural effusion in the left lateral region.  6. The mitral valve is abnormal. Mild to moderate mitral valve regurgitation.  7. The aortic valve is tricuspid. There is mild calcification of the aortic valve. There is moderate thickening of the aortic valve. Aortic valve regurgitation is not visualized. Aortic valve sclerosis/calcification is present, without any evidence of aortic stenosis.  8. Mildly dilated pulmonary artery. Conclusion(s)/Recommendation(s): No left ventricular mural or apical thrombus/thrombi. FINDINGS  Left Ventricle: Left ventricular ejection fraction, by estimation, is <20%. Left ventricular ejection fraction by 3D volume is 12 %. The left ventricle has severely decreased function. The left ventricle demonstrates global  hypokinesis. Definity contrast agent was given IV to delineate the left ventricular endocardial borders. The left ventricular internal cavity size was mildly dilated. There is moderate left ventricular hypertrophy. Left ventricular diastolic parameters are consistent with Grade II diastolic dysfunction (pseudonormalization). Elevated left atrial pressure. Right Ventricle: The right ventricular size is moderately enlarged. No increase in right ventricular wall thickness. Right ventricular systolic function is severely reduced. There is severely elevated pulmonary artery systolic pressure. The tricuspid regurgitant velocity is 3.51 m/s, and with an assumed right atrial pressure of 15 mmHg, the estimated right ventricular systolic pressure is 64.3 mmHg. Left Atrium: Left atrial size was mildly dilated. Right Atrium: Right atrial size was mildly dilated. Pericardium: There is no evidence of pericardial effusion. Mitral Valve: The mitral valve is abnormal. There is mild thickening of the mitral valve leaflet(s). Mild to moderate mitral valve regurgitation. MV peak gradient, 4.3 mmHg. The mean mitral valve gradient is 1.0 mmHg. Tricuspid Valve: The tricuspid valve is normal in structure. Tricuspid valve regurgitation is mild. Aortic Valve: The aortic valve is tricuspid. There is mild calcification of the aortic valve. There is moderate thickening of the aortic valve. Aortic valve regurgitation is not visualized. Aortic valve sclerosis/calcification is present, without any evidence of aortic stenosis. Aortic valve mean gradient measures 2.0 mmHg. Aortic valve peak gradient measures 4.3 mmHg. Aortic valve area, by VTI measures 2.98 cm. Pulmonic Valve: The pulmonic valve was normal in structure. Pulmonic valve regurgitation is trivial. No evidence of pulmonic stenosis. Aorta: The aortic root is normal in size and structure. Pulmonary Artery: The pulmonary artery is mildly dilated. IAS/Shunts: The interatrial septum was not  well visualized. Additional Comments: 3D was performed not requiring image post processing on an independent workstation and was abnormal. There is a large pleural effusion in the left lateral region.  LEFT VENTRICLE PLAX 2D LVIDd:         5.60 cm         Diastology LVIDs:         5.10 cm         LV e' medial:    5.03 cm/s LV PW:         1.30 cm  LV E/e' medial:  17.5 LV IVS:        1.20 cm         LV e' lateral:   7.58 cm/s LVOT diam:     2.00 cm         LV E/e' lateral: 11.6 LV SV:         52 LV SV Index:   27 LVOT Area:     3.14 cm        3D Volume EF                                LV 3D EF:    Left                                             ventricul                                             ar                                             ejection                                             fraction                                             by 3D                                             volume is                                             12 %.                                 3D Volume EF:                                3D EF:        12 % RIGHT VENTRICLE RV Basal diam:  4.90 cm LEFT ATRIUM             Index        RIGHT ATRIUM           Index LA diam:        5.20 cm 2.77 cm/m   RA Area:     21.30 cm LA Vol (A2C):   97.3 ml 51.89 ml/m  RA Volume:   75.30  ml  40.16 ml/m LA Vol (A4C):   79.7 ml 42.50 ml/m LA Biplane Vol: 88.7 ml 47.30 ml/m  AORTIC VALVE                    PULMONIC VALVE AV Area (Vmax):    3.09 cm     PV Vmax:       0.69 m/s AV Area (Vmean):   2.49 cm     PV Peak grad:  1.9 mmHg AV Area (VTI):     2.98 cm AV Vmax:           103.85 cm/s AV Vmean:          60.100 cm/s AV VTI:            0.173 m AV Peak Grad:      4.3 mmHg AV Mean Grad:      2.0 mmHg LVOT Vmax:         102.00 cm/s LVOT Vmean:        47.700 cm/s LVOT VTI:          0.164 m LVOT/AV VTI ratio: 0.95  AORTA Ao Root diam: 3.70 cm MITRAL VALVE               TRICUSPID VALVE MV Area (PHT): 7.02 cm    TR Peak grad:    49.3 mmHg MV Area VTI:   1.51 cm    TR Vmax:        351.00 cm/s MV Peak grad:  4.3 mmHg MV Mean grad:  1.0 mmHg    SHUNTS MV Vmax:       1.04 m/s    Systemic VTI:  0.16 m MV Vmean:      44.1 cm/s   Systemic Diam: 2.00 cm MV Decel Time: 108 msec MV E velocity: 88.00 cm/s MV A velocity: 46.10 cm/s MV E/A ratio:  1.91 Cristal Deer End MD Electronically signed by Yvonne Kendall MD Signature Date/Time: 06/08/2023/10:11:45 AM    Final    US RENAL Result Date: 06/07/2023 CLINICAL DATA:  Acute renal insufficiency EXAM: RENAL / URINARY TRACT ULTRASOUND COMPLETE COMPARISON:  10/30/2022 FINDINGS: Right Kidney: Not visualized. Left Kidney: Renal measurements: 8.8 x 4.1 x 3.7 cm = volume: 69.1 mL. Echogenicity within normal limits. No mass or hydronephrosis visualized. Bladder: Not visualized. Other: Examination is extremely limited due to patient cooperation and refusal to complete the evaluation. Incidental note is made of a left pleural effusion. IMPRESSION: 1. Limited study due to patient cooperation and refusal to complete the examination. The right kidney and bladder are not visualized. 2. Unremarkable left kidney. Electronically Signed   By: Sharlet Salina M.D.   On: 06/07/2023 21:32   US Abdomen Limited RUQ (LIVER/GB) Result Date: 06/07/2023 CLINICAL DATA:  Transaminitis. EXAM: ULTRASOUND ABDOMEN LIMITED RIGHT UPPER QUADRANT COMPARISON:  None Available. FINDINGS: Gallbladder: No gallstones or wall thickening visualized (1.5 mm). No sonographic Murphy sign noted by sonographer. Common bile duct: Diameter: 2.0 mm Liver: No focal lesion identified. Within normal limits in parenchymal echogenicity. Portal vein is patent on color Doppler imaging with normal direction of blood flow towards the liver. Other: The study is technically limited secondary to inability of the patient to hold breath or lay flat during the exam, as per the ultrasound technologist. IMPRESSION: Unremarkable right upper quadrant ultrasound.  Electronically Signed   By: Aram Candela M.D.   On: 06/07/2023 17:41   DG Chest Portable 1 View Result Date: 06/07/2023 CLINICAL DATA:  Short of breath,  respiratory distress EXAM: PORTABLE CHEST 1 VIEW COMPARISON:  10/30/2022 FINDINGS: Single frontal view of the chest demonstrates stable postsurgical changes from CABG. The cardiac silhouette is enlarged. Chronic areas of scarring and bronchiectasis are again seen throughout the lungs, with no new areas of consolidation identified. Trace bilateral pleural effusions. No pneumothorax. No acute bony abnormalities. IMPRESSION: 1. 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 2. Trace bilateral pleural effusions. 3. No acute airspace disease. Electronically Signed   By: Sharlet Salina M.D.   On: 06/07/2023 16:33     Medications:     Current Medications:  Chlorhexidine Gluconate Cloth  6 each Topical Daily   fentaNYL (SUBLIMAZE) injection  25 mcg Intravenous Once    Infusions:  amiodarone     amiodarone     furosemide (LASIX) 200 mg in dextrose 5 % 100 mL (2 mg/mL) infusion 10 mg/hr (06/08/23 1200)   heparin Stopped (06/08/23 1134)   milrinone        Assessment/Plan   1. 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  Echo with EF 30% at that time.  Lost to cardiology followup for years. Echo in 8/24, however, showed EF 30-35%.  It sounds like he developed gradually worsening CHF symptoms over the last several months but became acutely worse over the last couple of weeks with orthopnea, NYHA class IV symptoms. Admitted with cardiogenic shock, lactate 5.  Echo this admission with severe biventricular failure, EF < 20% with no LV thrombus, mild LV dilation and mild LVH, moderate RV enlargement with severe RV dysfunction, mild-moderate MR.  Initially hypotensive, but SBP now 120s.  Creatinine up to  3, has elevated LFTs consistent with shock liver. ACS unlikely, HS-TnI mildly elevated with no trend.  Lactate currently 2.9.   Patient is significantly volume overloaded on exam.  - Start milrinone 0.25 mcg/kg/min.  - Continue Lasix gtt 10 mg/hr.  - Place Unna boots.  - He will need placement of CVL for monitoring of CVP and co-ox.  2. AKI: On CKD stage 3, baseline creatinine 1.5.  Creatinine up to 3 in setting of cardiogenic shock.  He would be a difficult dialysis candidate with severe biventricular failure.  - Adding inotrope to increase cardiac output.  3. SVT: Patient had short run SVT with rate 150 yesterday.  He has frequent PACs.  - I am going to start amiodarone gtt 30 mg/hr while he is on milrinone.  4. Elevated LFTs: Suspect shock liver.   - Follow LFT trend.  - Hold statin for now.  5. ID: Suspect primarily cardiogenic shock but covering broadly for septic shock component.  1 bottle positive for S epidermidis, suspect most likely contaminant.  Afebrile, WBCs 11.6.  - On vancomycin/Zosyn.  6. CAD: Late-presented anterior MI in 12/09, patient had CABG in 1/10 with LIMA-LAD, SVG-OM1, sequential SVG-OM2 and dLCx, SVG-PDA. No coronary evaluation since that time.  No chest pain.  Mild troponin elevation with no trend this admission, suspect demand ischemia from shock/volume overload.  - No plan for coronary angiography, doubt ACS and AKI present.  - Statin on hold with elevated LFTs.  - On anticoagulation chronically so not on ASA.  7. 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  - Currently on heparin gtt.  8. Bronchiectasis with history of MAI: Chronic, suspect this is not his main issue currently.  9. Acute hypoxemic respiratory failure: Suspect due to pulmonary  edema. - Bipap.  - Diuresis.  10. 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   Patient is critically ill with multisystem organ failure. He has severe biventricular failure, not a candidate for advanced cardiac therapies  with age, AKI, and frailty.  His prognosis is guarded.   CRITICAL CARE Performed by: Marca Ancona   Total critical care time: 70 minutes  Critical care time was exclusive of separately billable procedures and treating other patients.  Critical care was necessary to treat or prevent imminent or life-threatening deterioration.  Critical care was time spent personally by me on the following activities: development of treatment plan with patient and/or surrogate as well as nursing, discussions with consultants, evaluation of patient's response to treatment, examination of patient, obtaining history from patient or surrogate, ordering and performing treatments and interventions, ordering and review of laboratory studies, ordering and review of radiographic studies, pulse oximetry and re-evaluation of patient's condition.   Length of Stay: 1  Marca Ancona, MD  06/08/2023, 12:25 PM  Advanced Heart Failure Team Pager [phone] (M-F; 7a - 5p)  Please contact [facility] for night-coverage after hours (4p -7a ) and weekends on [email or web address]

## 2023-06-08 NOTE — Plan of Care (Signed)

## 2023-06-08 NOTE — Procedures (Signed)
 Central Venous Catheter Insertion Procedure Note  SNYDER COLAVITO  [id number]  27-Apr-1940  Date:06/08/23  Time:3:46 PM   Provider Performing:Kyriakos Babler   Procedure: Insertion of Non-tunneled Central Venous [phone]) with US guidance ([id number])   Indication(s) Medication administration  Consent Risks of the procedure as well as the alternatives and risks of each were explained to the patient and/or caregiver.  Consent for the procedure was obtained and is signed in the bedside chart  Anesthesia Topical only with 1% lidocaine   Timeout Verified patient identification, verified procedure, site/side was marked, verified correct patient position, special equipment/implants available, medications/allergies/relevant history reviewed, required imaging and test results available.  Sterile Technique Maximal sterile technique including full sterile barrier drape, hand hygiene, sterile gown, sterile gloves, mask, hair covering, sterile ultrasound probe cover (if used).  Procedure Description Area of catheter insertion was cleaned with chlorhexidine and draped in sterile fashion.  With real-time ultrasound guidance a central venous catheter was placed into the left subclavian vein. Nonpulsatile blood flow and easy flushing noted in all ports.  The catheter was sutured in place and sterile dressing applied.  Complications/Tolerance None; patient tolerated the procedure well. Chest X-ray is ordered to verify placement for internal jugular or subclavian cannulation.   Chest x-ray is not ordered for femoral cannulation.  EBL Minimal  Specimen(s) None  Raechel Chute, MD  Pulmonary Critical Care 06/08/2023 3:47 PM

## 2023-06-08 NOTE — Plan of Care (Signed)

## 2023-06-08 NOTE — Progress Notes (Signed)
 Initial Nutrition Assessment  DOCUMENTATION CODES:   Severe malnutrition in context of chronic illness  INTERVENTION:   RD will add supplements with diet advancement  Recommend MVI, thiamine and folic acid daily   Recommend high dose IV thiamine of 500 mg infused over 30 minutes, three times daily for two consecutive days and 250 mg intravenously once daily for an additional five days  Pt at high refeed risk; recommend monitor potassium, magnesium and phosphorus labs daily until stable  Daily weights   Check thiamine lab  NUTRITION DIAGNOSIS:   Severe Malnutrition related to chronic illness as evidenced by severe fat depletion, severe muscle depletion.  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  Visited pt's room today. Pt with AMS and on bipap so unable to obtain history. RD suspects pt with poor oral intake at baseline as pt with severe malnutrition. Pt noted to have anasarca. Per chart, pt has gained ~25lbs over the past 6 months. Pt's UBW appears to be ~130lbs. RD will add supplements with diet advancement; may need to consider NGT placement and nutrition support if diet unable to be advanced over the next 24-48hrs. Pt is at high refeed risk. Will check thiamine lab as pt is at risk for deficiency. Recommend high dose IV supplementation. Palliative care consult is pending.   Medications reviewed and include: lasix, heparin  Labs reviewed: K 4.8 wnl, BUN 76(H), creat 3.0(H), P 6.9(H), Mg 2.6(H), albumin 3.1(L), AST 1424(H), ALT  1235(H), tbili 1.9(H) BNP- 2985.2(H) Wbc- 11.6(H), Hgb 10.0(L), Hct 31.5(L) Cbgs- 77, 87, 105, 87  UOP-   NUTRITION - FOCUSED PHYSICAL EXAM:  Flowsheet Row Most Recent Value  Orbital Region Severe depletion  Upper Arm Region Severe depletion  Thoracic and Lumbar Region Severe depletion  Buccal Region Severe depletion  Temple Region Severe depletion  Clavicle Bone Region Severe depletion  Clavicle and Acromion Bone Region Severe depletion  Scapular Bone Region Severe depletion  Dorsal Hand Severe depletion  Patellar Region Severe depletion  Anterior Thigh Region Severe depletion  Posterior Calf Region Severe depletion  Edema (RD Assessment) Severe  Hair Reviewed  Eyes Reviewed  Mouth Reviewed  Skin Reviewed  Nails Reviewed   Diet Order:   Diet Order     None      EDUCATION NEEDS:   Not appropriate for education at this time  Skin:  Skin Assessment: Reviewed RN Assessment  Last BM:  PTA  Height:   Ht Readings from Last 1 Encounters:  06/07/23 5\' 10"  (1.778 m)    Weight:   Wt Readings from Last 1 Encounters:  06/08/23 70.5 kg    Ideal Body Weight:  75.45 kg  BMI:  Body mass index is 22.3 kg/m.  Estimated Nutritional Needs:   Kcal:  1800-2100kcal/day  Protein:  90-105g/day  Fluid:  1.9-2.2L/day  Betsey Holiday MS, RD, LDN If unable to be reached, please send secure chat to "RD inpatient" available from 8:00a-4:00p daily

## 2023-06-08 NOTE — Consult Note (Signed)
 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  He has multiple medical problems including coronary artery disease status post CABG, BPH, hypertension, interstitial lung disease, Sjogren syndrome, bronchiectasis.  Congestive heart failure, chronic anemia.  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  Patient is currently requiring BiPAP is not able to participate in the interview.  His wife who is at bedside reports that he has been getting short of breath for the past 2 to 3 weeks.  Overall his health has been declining over the past few months.  He has decreased appetite and increasing lower extremity edema.  His appetite has been poor.  He has not had any blood in the stool or urine.  He has not had any difficulty with urination lately.  He was seeing urology for urinary retention and elevated PSA.  Last seen in September 2024.  His bladder symptoms are managed with Gemtesa, tamsulosin and oxybutynin. He has history of PE and was on anticoagulation with Eliquis at home.   Nephrology consult has been requested for evaluation of acute kidney injury.  Patient's baseline creatinine appears to be 0.81 in August 2024.  In December 2024 his creatinine was 1.79.  Admit creatinine of 2.86 which has worsened today to 3.0.  He had Foley catheter in the emergency room but now has a pure wick.  Some urine was last due to improper fit.  It is being monitored closely from here on.   Review of Systems: ROS-is limited due to patient being critically ill.  See HPI.  Past Medical History:  Diagnosis Date   Acute hypoxemic respiratory failure (HCC)    BPH (benign prostatic hyperplasia)     Bronchiectasis (HCC)    CAD (coronary artery disease)    Post anterior wall myocardial infarction and 5-vessel coronary bypass    Chronic anemia    COVID-19    Depression    Dyslipidemia    Dyspnea    ED (erectile dysfunction)    Elevated PSA    Falls    Gross hematuria    History of methicillin resistant staphylococcus aureus (MRSA) 02/2022   + PCR nasal swab   Hx of hypotension    was placed on midodrine and then taken off   Hypertension    Interstitial lung disease (HCC)    Ischemic cardiomyopathy    with left ventricular ejection fraction of 35%   Left ventricular aneurysm    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   Myocardial infarction (HCC) 2009   Pneumonia    Protein-calorie malnutrition, severe (HCC)    S/P CABG x 5 2010   Sepsis due to pneumonia (HCC)    Sjogren's syndrome (HCC)    lung involvment    Social History   Tobacco Use   Smoking status: Former    Types: Cigarettes    Passive exposure: Past   Smokeless tobacco: Never   Tobacco comments:    Quit around age 22  Vaping Use   Vaping status: Never Used  Substance Use Topics   Alcohol use: Not Currently    Alcohol/week: 12.0 standard drinks of alcohol  Types: 12 Shots of liquor per week    Comment: occ   Drug use: No    Family History  Problem Relation Age of Onset   Coronary artery disease Other    Heart attack Sister 17   Heart attack Maternal Grandfather 40   Heart attack Paternal Grandfather 19   COPD Mother    Pneumonia Father    Cancer Sister      OBJECTIVE: Blood pressure 105/64, pulse 82, temperature 97.8 F (36.6 C), temperature source Axillary, resp. rate 17, height 5\' 10"  (1.778 m), weight 70.5 kg, SpO2 99%.  Physical Exam Appearance-chronically ill-appearing, frail, laying in the bed HEENT-NIPPV mask in place Pulmonary-requiring noninvasive ventilation, coarse crackles bilaterally Cardiac-irregular rhythm, tachycardic Abdomen-nontender,  nondistended Extremities-dependent edema is present Neuro-lethargic, Skin-warm, dry  Lab Results Lab Results  Component Value Date   WBC 11.6 (H) 06/08/2023   HGB 10.0 (L) 06/08/2023   HCT 31.5 (L) 06/08/2023   MCV 84.0 06/08/2023   PLT 258 06/08/2023    Lab Results  Component Value Date   CREATININE 3.00 (H) 06/08/2023   BUN 76 (H) 06/08/2023   NA 141 06/08/2023   K 4.8 06/08/2023   CL 102 06/08/2023   CO2 23 06/08/2023    Lab Results  Component Value Date   ALT 1,235 (H) 06/08/2023   AST 1,424 (H) 06/08/2023   ALKPHOS 66 06/08/2023   BILITOT 1.9 (H) 06/08/2023     Microbiology: Recent Results (from the past 240 hours)  Culture, blood (routine x 2)     Status: None (Preliminary result)   Collection Time: 06/07/23  3:41 PM   Specimen: BLOOD  Result Value Ref Range Status   Specimen Description BLOOD LAC  Final   Special Requests   Final    BOTTLES DRAWN AEROBIC AND ANAEROBIC Blood Culture results may not be optimal due to an inadequate volume of blood received in culture bottles   Culture  Setup Time   Final    GRAM POSITIVE COCCI ANAEROBIC BOTTLE ONLY Organism ID to follow CRITICAL RESULT CALLED TO, READ BACK BY AND VERIFIED WITH: WILL ANDERSON 06/08/23 1025 MW Performed at [facility], [address] [place], [place] [address]    Culture PENDING  Incomplete   Report Status PENDING  Incomplete  Blood Culture ID Panel (Reflexed)     Status: Abnormal   Collection Time: 06/07/23  3:41 PM  Result Value Ref Range Status   Enterococcus faecalis NOT DETECTED NOT DETECTED Final   Enterococcus Faecium NOT DETECTED NOT DETECTED Final   Listeria monocytogenes NOT DETECTED NOT DETECTED Final   Staphylococcus species DETECTED (A) NOT DETECTED Final    Comment: CRITICAL RESULT CALLED TO, READ BACK BY AND VERIFIED WITH: WILL ANDERSON 06/08/23 1025 MW    Staphylococcus aureus (BCID) NOT DETECTED NOT DETECTED Final   Staphylococcus epidermidis DETECTED (A) NOT  DETECTED Final    Comment: Methicillin (oxacillin) resistant coagulase negative staphylococcus. Possible blood culture contaminant (unless isolated from more than one blood culture draw or clinical case suggests pathogenicity). No antibiotic treatment is indicated for blood  culture contaminants. CRITICAL RESULT CALLED TO, READ BACK BY AND VERIFIED WITH: WILL ANDERSON 06/08/23 1025 MW    Staphylococcus lugdunensis NOT DETECTED NOT DETECTED Final   Streptococcus species NOT DETECTED NOT DETECTED Final   Streptococcus agalactiae NOT DETECTED NOT DETECTED Final   Streptococcus pneumoniae NOT DETECTED NOT DETECTED Final   Streptococcus pyogenes NOT DETECTED NOT DETECTED Final   A.calcoaceticus-baumannii NOT DETECTED NOT DETECTED  Final   Bacteroides fragilis NOT DETECTED NOT DETECTED Final   Enterobacterales NOT DETECTED NOT DETECTED Final   Enterobacter cloacae complex NOT DETECTED NOT DETECTED Final   Escherichia coli NOT DETECTED NOT DETECTED Final   Klebsiella aerogenes NOT DETECTED NOT DETECTED Final   Klebsiella oxytoca NOT DETECTED NOT DETECTED Final   Klebsiella pneumoniae NOT DETECTED NOT DETECTED Final   Proteus species NOT DETECTED NOT DETECTED Final   Salmonella species NOT DETECTED NOT DETECTED Final   Serratia marcescens NOT DETECTED NOT DETECTED Final   Haemophilus influenzae NOT DETECTED NOT DETECTED Final   Neisseria meningitidis NOT DETECTED NOT DETECTED Final   Pseudomonas aeruginosa NOT DETECTED NOT DETECTED Final   Stenotrophomonas maltophilia NOT DETECTED NOT DETECTED Final   Candida albicans NOT DETECTED NOT DETECTED Final   Candida auris NOT DETECTED NOT DETECTED Final   Candida glabrata NOT DETECTED NOT DETECTED Final   Candida krusei NOT DETECTED NOT DETECTED Final   Candida parapsilosis NOT DETECTED NOT DETECTED Final   Candida tropicalis NOT DETECTED NOT DETECTED Final   Cryptococcus neoformans/gattii NOT DETECTED NOT DETECTED Final   Methicillin resistance  mecA/C DETECTED (A) NOT DETECTED Final    Comment: CRITICAL RESULT CALLED TO, READ BACK BY AND VERIFIED WITH: WILL ANDERSON 06/08/23 1025 MW Performed at [facility], [address] [place], [place] [address]   Resp panel by RT-PCR (RSV, Flu A&B, Covid) Anterior Nasal Swab     Status: None   Collection Time: 06/07/23  3:42 PM   Specimen: Anterior Nasal Swab  Result Value Ref Range Status   SARS Coronavirus 2 by RT PCR NEGATIVE NEGATIVE Final    Comment: (NOTE) SARS-CoV-2 target nucleic acids are NOT DETECTED.  The SARS-CoV-2 RNA is generally detectable in upper respiratory specimens during the acute phase of infection. The lowest concentration of SARS-CoV-2 viral copies this assay can detect is 138 copies/mL. A negative result does not preclude SARS-Cov-2 infection and should not be used as the sole basis for treatment or other patient management decisions. A negative result may occur with  improper specimen collection/handling, submission of specimen other than nasopharyngeal swab, presence of viral mutation(s) within the areas targeted by this assay, and inadequate number of viral copies(<138 copies/mL). A negative result must be combined with clinical observations, patient history, and epidemiological information. The expected result is Negative.  Fact Sheet for Patients:  BloggerCourse.com  Fact Sheet for Healthcare Providers:  SeriousBroker.it  This test is no t yet approved or cleared by the [place] FDA and  has been authorized for detection and/or diagnosis of SARS-CoV-2 by FDA under an Emergency Use Authorization (EUA). This EUA will remain  in effect (meaning this test can be used) for the duration of the COVID-19 declaration under Section 564(b)(1) of the Act, 21 U.S.C.section 360bbb-3(b)(1), unless the authorization is terminated  or revoked sooner.       Influenza A by PCR NEGATIVE NEGATIVE Final    Influenza B by PCR NEGATIVE NEGATIVE Final    Comment: (NOTE) The Xpert Xpress SARS-CoV-2/FLU/RSV plus assay is intended as an aid in the diagnosis of influenza from Nasopharyngeal swab specimens and should not be used as a sole basis for treatment. Nasal washings and aspirates are unacceptable for Xpert Xpress SARS-CoV-2/FLU/RSV testing.  Fact Sheet for Patients: BloggerCourse.com  Fact Sheet for Healthcare Providers: SeriousBroker.it  This test is not yet approved or cleared by the [place] FDA and has been authorized for detection and/or diagnosis of SARS-CoV-2 by FDA under  an Emergency Use Authorization (EUA). This EUA will remain in effect (meaning this test can be used) for the duration of the COVID-19 declaration under Section 564(b)(1) of the Act, 21 U.S.C. section 360bbb-3(b)(1), unless the authorization is terminated or revoked.     Resp Syncytial Virus by PCR NEGATIVE NEGATIVE Final    Comment: (NOTE) Fact Sheet for Patients: BloggerCourse.com  Fact Sheet for Healthcare Providers: SeriousBroker.it  This test is not yet approved or cleared by the [place] FDA and has been authorized for detection and/or diagnosis of SARS-CoV-2 by FDA under an Emergency Use Authorization (EUA). This EUA will remain in effect (meaning this test can be used) for the duration of the COVID-19 declaration under Section 564(b)(1) of the Act, 21 U.S.C. section 360bbb-3(b)(1), unless the authorization is terminated or revoked.  Performed at [facility], [address] [place], [place] [address]   Culture, blood (routine x 2)     Status: None (Preliminary result)   Collection Time: 06/07/23  3:42 PM   Specimen: BLOOD  Result Value Ref Range Status   Specimen Description BLOOD RAC  Final   Special Requests   Final    BOTTLES DRAWN AEROBIC AND ANAEROBIC Blood Culture  results may not be optimal due to an inadequate volume of blood received in culture bottles   Culture   Final    NO GROWTH < 24 HOURS Performed at [facility], [address] [place], [place] [address]    Report Status PENDING  Incomplete  MRSA Next Gen by PCR, Nasal     Status: Abnormal   Collection Time: 06/07/23  3:42 PM   Specimen: Nasal Mucosa; Nasal Swab  Result Value Ref Range Status   MRSA by PCR Next Gen DETECTED (A) NOT DETECTED Final    Comment: CRITICAL RESULT CALLED TO, READ BACK BY AND VERIFIED WITH: TUCKER, S. 2050 06/07/23 LRL (NOTE) The GeneXpert MRSA Assay (FDA approved for NASAL specimens only), is one component of a comprehensive MRSA colonization surveillance program. It is not intended to diagnose MRSA infection nor to guide or monitor treatment for MRSA infections. Test performance is not FDA approved in patients less than 73 years old. Performed at [facility], [address] [place], [place] [address]     Medications: Scheduled Meds:  Chlorhexidine Gluconate Cloth  6 each Topical Daily   fentaNYL (SUBLIMAZE) injection  25 mcg Intravenous Once   Continuous Infusions:  amiodarone     amiodarone     furosemide (LASIX) 200 mg in dextrose 5 % 100 mL (2 mg/mL) infusion 10 mg/hr (06/08/23 1300)   heparin Stopped (06/08/23 1134)   milrinone     PRN Meds:.docusate sodium, polyethylene glycol  Allergies  Allergen Reactions   Ace Inhibitors Cough    Urinalysis: No results for input(s): "COLORURINE", "LABSPEC", "PHURINE", "GLUCOSEU", "HGBUR", "BILIRUBINUR", "KETONESUR", "PROTEINUR", "UROBILINOGEN", "NITRITE", "LEUKOCYTESUR" in the last 72 hours.  Invalid input(s): "APPERANCEUR"    Imaging: ECHOCARDIOGRAM COMPLETE Result Date: 06/08/2023    ECHOCARDIOGRAM REPORT   Patient Name:   Nicholas Alvarez [facility] Date of Exam: 06/08/2023 Medical Rec #:  [id number]      Height:       70.0 in Accession #:    [id number]     Weight:       155.4 lb Date of  Birth:  24-Feb-1941      BSA:          1.875 m Patient Age:    41 years  BP:           111/59 mmHg Patient Gender: M              HR:           91 bpm. Exam Location:  [facility] Procedure: 2D Echo, 3D Echo, Cardiac Doppler, Color Doppler and Intracardiac            Opacification Agent (Both Spectral and Color Flow Doppler were            utilized during procedure). Indications:     Dyapnea  History:         Patient has prior history of Echocardiogram examinations, most                  recent 11/02/2022. CAD and Previous Myocardial Infarction, Prior                  CABG; Signs/Symptoms:Dyspnea. Pulmonary embolus.  Sonographer:     Mikki Harbor Referring Phys:  [id number] KHABIB DGAYLI Diagnosing Phys: Yvonne Kendall MD IMPRESSIONS  1. Left ventricular ejection fraction, by estimation, is <20%. Left ventricular ejection fraction by 3D volume is 12 %. The left ventricle has severely decreased function. The left ventricle demonstrates global hypokinesis. The left ventricular internal  cavity size was mildly dilated. There is moderate left ventricular hypertrophy. Left ventricular diastolic parameters are consistent with Grade II diastolic dysfunction (pseudonormalization). Elevated left atrial pressure.  2. Right ventricular systolic function is severely reduced. The right ventricular size is moderately enlarged. There is severely elevated pulmonary artery systolic pressure.  3. Left atrial size was mildly dilated.  4. Right atrial size was mildly dilated.  5. Large pleural effusion in the left lateral region.  6. The mitral valve is abnormal. Mild to moderate mitral valve regurgitation.  7. The aortic valve is tricuspid. There is mild calcification of the aortic valve. There is moderate thickening of the aortic valve. Aortic valve regurgitation is not visualized. Aortic valve sclerosis/calcification is present, without any evidence of aortic stenosis.  8. Mildly dilated pulmonary artery.  Conclusion(s)/Recommendation(s): No left ventricular mural or apical thrombus/thrombi. FINDINGS  Left Ventricle: Left ventricular ejection fraction, by estimation, is <20%. Left ventricular ejection fraction by 3D volume is 12 %. The left ventricle has severely decreased function. The left ventricle demonstrates global hypokinesis. Definity contrast agent was given IV to delineate the left ventricular endocardial borders. The left ventricular internal cavity size was mildly dilated. There is moderate left ventricular hypertrophy. Left ventricular diastolic parameters are consistent with Grade II diastolic dysfunction (pseudonormalization). Elevated left atrial pressure. Right Ventricle: The right ventricular size is moderately enlarged. No increase in right ventricular wall thickness. Right ventricular systolic function is severely reduced. There is severely elevated pulmonary artery systolic pressure. The tricuspid regurgitant velocity is 3.51 m/s, and with an assumed right atrial pressure of 15 mmHg, the estimated right ventricular systolic pressure is 64.3 mmHg. Left Atrium: Left atrial size was mildly dilated. Right Atrium: Right atrial size was mildly dilated. Pericardium: There is no evidence of pericardial effusion. Mitral Valve: The mitral valve is abnormal. There is mild thickening of the mitral valve leaflet(s). Mild to moderate mitral valve regurgitation. MV peak gradient, 4.3 mmHg. The mean mitral valve gradient is 1.0 mmHg. Tricuspid Valve: The tricuspid valve is normal in structure. Tricuspid valve regurgitation is mild. Aortic Valve: The aortic valve is tricuspid. There is mild calcification of the aortic valve. There is moderate thickening of the aortic valve. Aortic valve regurgitation is  not visualized. Aortic valve sclerosis/calcification is present, without any evidence of aortic stenosis. Aortic valve mean gradient measures 2.0 mmHg. Aortic valve peak gradient measures 4.3 mmHg. Aortic valve  area, by VTI measures 2.98 cm. Pulmonic Valve: The pulmonic valve was normal in structure. Pulmonic valve regurgitation is trivial. No evidence of pulmonic stenosis. Aorta: The aortic root is normal in size and structure. Pulmonary Artery: The pulmonary artery is mildly dilated. IAS/Shunts: The interatrial septum was not well visualized. Additional Comments: 3D was performed not requiring image post processing on an independent workstation and was abnormal. There is a large pleural effusion in the left lateral region.  LEFT VENTRICLE PLAX 2D LVIDd:         5.60 cm         Diastology LVIDs:         5.10 cm         LV e' medial:    5.03 cm/s LV PW:         1.30 cm         LV E/e' medial:  17.5 LV IVS:        1.20 cm         LV e' lateral:   7.58 cm/s LVOT diam:     2.00 cm         LV E/e' lateral: 11.6 LV SV:         52 LV SV Index:   27 LVOT Area:     3.14 cm        3D Volume EF                                LV 3D EF:    Left                                             ventricul                                             ar                                             ejection                                             fraction                                             by 3D                                             volume is  12 %.                                 3D Volume EF:                                3D EF:        12 % RIGHT VENTRICLE RV Basal diam:  4.90 cm LEFT ATRIUM             Index        RIGHT ATRIUM           Index LA diam:        5.20 cm 2.77 cm/m   RA Area:     21.30 cm LA Vol (A2C):   97.3 ml 51.89 ml/m  RA Volume:   75.30 ml  40.16 ml/m LA Vol (A4C):   79.7 ml 42.50 ml/m LA Biplane Vol: 88.7 ml 47.30 ml/m  AORTIC VALVE                    PULMONIC VALVE AV Area (Vmax):    3.09 cm     PV Vmax:       0.69 m/s AV Area (Vmean):   2.49 cm     PV Peak grad:  1.9 mmHg AV Area (VTI):     2.98 cm AV Vmax:           103.85 cm/s AV Vmean:           60.100 cm/s AV VTI:            0.173 m AV Peak Grad:      4.3 mmHg AV Mean Grad:      2.0 mmHg LVOT Vmax:         102.00 cm/s LVOT Vmean:        47.700 cm/s LVOT VTI:          0.164 m LVOT/AV VTI ratio: 0.95  AORTA Ao Root diam: 3.70 cm MITRAL VALVE               TRICUSPID VALVE MV Area (PHT): 7.02 cm    TR Peak grad:   49.3 mmHg MV Area VTI:   1.51 cm    TR Vmax:        351.00 cm/s MV Peak grad:  4.3 mmHg MV Mean grad:  1.0 mmHg    SHUNTS MV Vmax:       1.04 m/s    Systemic VTI:  0.16 m MV Vmean:      44.1 cm/s   Systemic Diam: 2.00 cm MV Decel Time: 108 msec MV E velocity: 88.00 cm/s MV A velocity: 46.10 cm/s MV E/A ratio:  1.91 Cristal Deer End MD Electronically signed by Yvonne Kendall MD Signature Date/Time: 06/08/2023/10:11:45 AM    Final    US RENAL Result Date: 06/07/2023 CLINICAL DATA:  Acute renal insufficiency EXAM: RENAL / URINARY TRACT ULTRASOUND COMPLETE COMPARISON:  10/30/2022 FINDINGS: Right Kidney: Not visualized. Left Kidney: Renal measurements: 8.8 x 4.1 x 3.7 cm = volume: 69.1 mL. Echogenicity within normal limits. No mass or hydronephrosis visualized. Bladder: Not visualized. Other: Examination is extremely limited due to patient cooperation and refusal to complete the evaluation. Incidental note is made of a left pleural effusion. IMPRESSION: 1. Limited study due to patient cooperation and refusal to complete the examination. The right kidney and bladder  are not visualized. 2. Unremarkable left kidney. Electronically Signed   By: Sharlet Salina M.D.   On: 06/07/2023 21:32   US Abdomen Limited RUQ (LIVER/GB) Result Date: 06/07/2023 CLINICAL DATA:  Transaminitis. EXAM: ULTRASOUND ABDOMEN LIMITED RIGHT UPPER QUADRANT COMPARISON:  None Available. FINDINGS: Gallbladder: No gallstones or wall thickening visualized (1.5 mm). No sonographic Murphy sign noted by sonographer. Common bile duct: Diameter: 2.0 mm Liver: No focal lesion identified. Within normal limits in parenchymal echogenicity.  Portal vein is patent on color Doppler imaging with normal direction of blood flow towards the liver. Other: The study is technically limited secondary to inability of the patient to hold breath or lay flat during the exam, as per the ultrasound technologist. IMPRESSION: Unremarkable right upper quadrant ultrasound. Electronically Signed   By: Aram Candela M.D.   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 The cardiac silhouette is enlarged. Chronic areas of scarring and bronchiectasis are again seen throughout the lungs, with no new areas of consolidation identified. Trace bilateral pleural effusions. No pneumothorax. No acute bony abnormalities. IMPRESSION: 1. 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 2. Trace bilateral pleural effusions. 3. No acute airspace disease. Electronically Signed   By: Sharlet Salina M.D.   On: 06/07/2023 16:33      Assessment/Plan:  Nicholas Alvarez is a 83 y.o. male with medical problems of coronary disease status post CABG, severe CHF, interstitial lung disease, Sjogren's syndrome, bronchiectasis, history of MAI infection, BPH, hypertension  was admitted on 06/07/2023 for :  Shortness of breath [R06.02] Transaminitis [R74.01] Elevated troponin [R79.89] History of bronchiectasis [Z87.09] AKI (acute kidney injury) (HCC) [N17.9] History of MAI infection [Z86.19] Acute hypoxemic respiratory failure (HCC) [J96.01] Acute on chronic congestive heart failure, unspecified heart failure type (HCC) [I50.9] Cough, unspecified type [R05.9] Acute hypoxic respiratory failure (HCC) [J96.01]  1.  Acute kidney injury-likely secondary to cardiorenal  syndrome, hypotension and severe CHF.  Patient is oliguric.  Current creatinine has increased to 3.0.  He will be tried on CHF regimen of milrinone, amiodarone, IV Lasix to optimize cardiac output.  We will see if he responds and if his urine output improves.  His latest 2D echo showed LVEF less than 20% With global hypokinesis, LVH, grade 2 diastolic dysfunction and severely reduced right ventricular function with severely elevated pulmonary artery systolic pressure.  No acute indication for dialysis at present.  We will continue to follow along and monitor progress.  Given severe CHF, dialysis would be difficult and may cause further hemodynamic compromise.  2.  Chronic kidney disease stage IIIb.  Baseline creatinine 1.79/GFR 38 from December 2024.  3.  Acute exacerbation of chronic severe systolic and diastolic CHF causing acute respiratory failure.   -Inotropic regimen as per cardiology team.   Mosetta Pigeon 06/08/23

## 2023-06-08 NOTE — Progress Notes (Signed)
 Morphine discontinued in rounds, Pt complaining of SOB, oxygenating good, after multiple adjustments with bipap machine patient still complaining of SOB, MD notified multiple times, with no new orders given, will continue to monitor

## 2023-06-08 NOTE — Progress Notes (Signed)
 PHARMACY - PHYSICIAN COMMUNICATION CRITICAL VALUE ALERT - BLOOD CULTURE IDENTIFICATION (BCID)  Nicholas Alvarez is an 83 y.o. male who presented to [facility] on 06/07/2023 with a chief complaint of shortness of breath and altered mental status.    Assessment: blood cultures from 3/31 with GPC currently in 1 of 4 bottles, BCID detects Methicillin resistant S. EPIDERMIDIS  Name of physician (or Provider) Contacted: Dr Aundria Rud  Current antibiotics: none  Changes to prescribed antibiotics recommended:  - likely contaminant - monitor off antibiotic  Results for orders placed or performed during the hospital encounter of 06/07/23  Blood Culture ID Panel (Reflexed) (Collected: 06/07/2023  3:41 PM)  Result Value Ref Range   Enterococcus faecalis NOT DETECTED NOT DETECTED   Enterococcus Faecium NOT DETECTED NOT DETECTED   Listeria monocytogenes NOT DETECTED NOT DETECTED   Staphylococcus species DETECTED (A) NOT DETECTED   Staphylococcus aureus (BCID) NOT DETECTED NOT DETECTED   Staphylococcus epidermidis PENDING NOT DETECTED   Staphylococcus lugdunensis NOT DETECTED NOT DETECTED   Streptococcus species NOT DETECTED NOT DETECTED   Streptococcus agalactiae NOT DETECTED NOT DETECTED   Streptococcus pneumoniae NOT DETECTED NOT DETECTED   Streptococcus pyogenes NOT DETECTED NOT DETECTED   A.calcoaceticus-baumannii NOT DETECTED NOT DETECTED   Bacteroides fragilis NOT DETECTED NOT DETECTED   Enterobacterales NOT DETECTED NOT DETECTED   Enterobacter cloacae complex NOT DETECTED NOT DETECTED   Escherichia coli NOT DETECTED NOT DETECTED   Klebsiella aerogenes NOT DETECTED NOT DETECTED   Klebsiella oxytoca NOT DETECTED NOT DETECTED   Klebsiella pneumoniae NOT DETECTED NOT DETECTED   Proteus species NOT DETECTED NOT DETECTED   Salmonella species NOT DETECTED NOT DETECTED   Serratia marcescens NOT DETECTED NOT DETECTED   Haemophilus influenzae NOT DETECTED NOT DETECTED   Neisseria meningitidis NOT  DETECTED NOT DETECTED   Pseudomonas aeruginosa NOT DETECTED NOT DETECTED   Stenotrophomonas maltophilia NOT DETECTED NOT DETECTED   Candida albicans NOT DETECTED NOT DETECTED   Candida auris NOT DETECTED NOT DETECTED   Candida glabrata NOT DETECTED NOT DETECTED   Candida krusei NOT DETECTED NOT DETECTED   Candida parapsilosis NOT DETECTED NOT DETECTED   Candida tropicalis NOT DETECTED NOT DETECTED   Cryptococcus neoformans/gattii NOT DETECTED NOT DETECTED   Methicillin resistance mecA/C DETECTED (A) NOT DETECTED    Juliette Alcide, PharmD, BCPS, BCIDP Work Cell: [phone] 06/08/2023 10:33 AM

## 2023-06-08 NOTE — Progress Notes (Signed)
   06/08/23 1515  Spiritual Encounters  Type of Visit Initial  Care provided to: Pt and family (Wife at bedside)  Referral source Chaplain assessment  Reason for visit Routine spiritual support  OnCall Visit No  Spiritual Framework  Presenting Themes Courage hope and growth;Meaning/purpose/sources of inspiration;Other (comment) (Helped walk w/wife to the restroom and walked with her to the waiting room and back w/patient.)  Values/beliefs Wife stated that she prayed the procedure the Dr.s were doing would go well.  Interventions  Spiritual Care Interventions Made Established relationship of care and support;Compassionate presence;Reflective listening;Encouragement  Intervention Outcomes  Outcomes Connection to spiritual care;Awareness around self/spiritual resourses;Awareness of support

## 2023-06-08 NOTE — Progress Notes (Addendum)
 Called to the bedside at 2130 for HR 150-180. [place] tech was in the process of obtaining BL renal US when patient became acutely dyspneic and HR rose to 180 beats/min and his SPO2 dropped to the 90s. Procedure aborted/ Amiodarone 150mg  bolus given with good effect. HR decreased to 80-90 bpm but patient remained dyspneic with fluctuating SPO2 between 85-90% on BiPAP.  On exam, patient remained tachypneic with RR 28-30, diffuse crackles in all lung fields and intercostal retractions. Persistent +4 pitting edema from the feet to the thighs. Assessment: Severe decompensated heart failure. Plan of care updated to include furosemide infusion at 8 mg/hr, K+ LEVEL Q 4hrs, foley for strict Is/Os  and low dose morphine 1mg  q3hrs prn. Cardiology consult and echo in am. Will consider dobutamine or milrinone if patient becomes hypotensive on lasix gtte. Continue BiPAP. STAT ABG. Patient is at high risk for intubation. Plan of care updated in collaboration with elink MD Dr. Everardo All. Critical care time spent=45 minutes  Nicholas Alvarez, ANP-BC Pulmonary and Critical Care Medicine Prefer epic messenger for cross cover needs or call ICU at [phone] If after hours, please call E-link.  NB: This document was prepared using Dragon voice recognition software and may include unintentional dictation errors.

## 2023-06-08 NOTE — Progress Notes (Signed)
 PHARMACY CONSULT NOTE - FOLLOW UP  Pharmacy Consult for Electrolyte Monitoring and Replacement   Recent Labs: Potassium (mmol/L)  Date Value  06/08/2023 4.8  10/24/2012 3.7   Magnesium (mg/dL)  Date Value  60/45/4098 2.6 (H)   Calcium (mg/dL)  Date Value  11/91/4782 8.4 (L)   Calcium, Total (mg/dL)  Date Value  95/62/1308 9.3   Albumin (g/dL)  Date Value  65/78/4696 3.1 (L)  10/24/2012 3.4   Phosphorus (mg/dL)  Date Value  29/52/8413 6.9 (H)   Sodium (mmol/L)  Date Value  06/08/2023 141  10/24/2012 138     Assessment: Patient is a 83 year old male with a past medical history of CHF with reduced ejection fraction, chronic MAI infection, bronchiectasis, ILD, CAD status post CABG, PE on Eliquis, presenting with shortness of breath and leg swelling. Pharmacy has been consulted to monitor and replace electrolytes as needed via CCM protocol.   Goal of Therapy:  Electrolytes wnl  Plan:  No electrolyte replacement warranted for today Monitor BMP with AM labs  Lowella Bandy ,PharmD Clinical Pharmacist 06/08/2023 6:58 AM

## 2023-06-08 NOTE — Progress Notes (Addendum)
 Heart Failure MD had ordered Bilateral Neomia Dear Boots for patient, Patient refused to have anything on his legs, Heart failure MD made aware

## 2023-06-08 NOTE — Progress Notes (Signed)
 PHARMACY - ANTICOAGULATION CONSULT NOTE  Pharmacy Consult for Heparin Infusion- per MD, no bolus Indication: DVT  Allergies  Allergen Reactions   Ace Inhibitors Cough    Patient Measurements: Height: 5\' 10"  (177.8 cm) Weight: 71.2 kg (157 lb) IBW/kg (Calculated) : 73 HEPARIN DW (KG): 71.2  Vital Signs: Temp: 97.6 F (36.4 C) (04/01 0430) Temp Source: Axillary (04/01 0430) BP: 111/59 (04/01 0500) Pulse Rate: 76 (04/01 0500)  Labs: Recent Labs    06/07/23 1542 06/07/23 1730 06/07/23 1953 06/07/23 2139 06/08/23 0214 06/08/23 0413  HGB 10.4*  --   --   --   --  10.0*  HCT 34.2*  --   --   --   --  31.5*  PLT 304  --   --   --   --  258  APTT  --   --  35  --   --  45*  LABPROT 26.5*  --   --   --   --   --   INR 2.4*  --   --   --   --   --   HEPARINUNFRC  --   --  0.46  --   --  0.44  CREATININE 2.86*  --   --  2.89*  --  2.99*  TROPONINIHS 395* 377*  --  390* 404*  --     Estimated Creatinine Clearance: 19.8 mL/min (A) (by C-G formula based on SCr of 2.99 mg/dL (H)).   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    Chronic anemia    COVID-19    Depression    Dyslipidemia    Dyspnea    ED (erectile dysfunction)    Elevated PSA    Falls    Gross hematuria    History of methicillin resistant staphylococcus aureus (MRSA) 02/2022   + PCR nasal swab   Hx of hypotension    was placed on midodrine and then taken off   Hypertension    Interstitial lung disease (HCC)    Ischemic cardiomyopathy    with left ventricular ejection fraction of 35%   Left ventricular aneurysm    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 Last dose 3/30 @ 1300.  Assessment: Patient is a 83 year old male with a past medical history of CHF with reduced ejection fraction, chronic MAI infection, bronchiectasis, ILD, CAD status post CABG, PE on Eliquis, presenting with shortness of breath and leg swelling. Pharmacy has been consulted to initiate patient on a heparin infusion for VTE treatment.  Baseline heparin level and aPTT ordered. Baseline INR elevated at 2.4.  No signs/symptoms of bleeding noted in chart. Hgb 10.4 (above baseline). PLT 304.  Goal of Therapy:  Heparin level 0.3-0.7 units/ml aPTT 66-102 seconds Monitor platelets by anticoagulation protocol: Yes  04/01 0413 aPTT 45, subtherapeutic / HL 0.44, not correlating   Plan:  No bolus per MD Increase heparin infusion to a rate of  1300 units/hr Will monitor via aPTT levels until heparin level correlates, then can transition to heparin level monitoring only Recheck aPTT in 8 hours after rate change, heparin levels daily until correlating with aPTT Monitor CBC daily while on heparin   Otelia Sergeant, PharmD, [facility] 06/08/2023 5:15 AM

## 2023-06-08 DEATH — deceased

## 2023-06-09 DIAGNOSIS — Z515 Encounter for palliative care: Secondary | ICD-10-CM

## 2023-06-09 DIAGNOSIS — J9601 Acute respiratory failure with hypoxia: Secondary | ICD-10-CM | POA: Diagnosis not present

## 2023-06-09 DIAGNOSIS — Z66 Do not resuscitate: Secondary | ICD-10-CM

## 2023-06-09 DIAGNOSIS — I509 Heart failure, unspecified: Secondary | ICD-10-CM | POA: Diagnosis not present

## 2023-06-09 DIAGNOSIS — Z8619 Personal history of other infectious and parasitic diseases: Secondary | ICD-10-CM

## 2023-06-09 DIAGNOSIS — R570 Cardiogenic shock: Secondary | ICD-10-CM | POA: Diagnosis not present

## 2023-06-09 DIAGNOSIS — R7989 Other specified abnormal findings of blood chemistry: Secondary | ICD-10-CM

## 2023-06-09 DIAGNOSIS — Z7189 Other specified counseling: Secondary | ICD-10-CM | POA: Diagnosis not present

## 2023-06-09 DIAGNOSIS — N179 Acute kidney failure, unspecified: Secondary | ICD-10-CM | POA: Diagnosis not present

## 2023-06-09 LAB — RENAL FUNCTION PANEL
Albumin: 2.8 g/dL — ABNORMAL LOW (ref 3.5–5.0)
Anion gap: 13 (ref 5–15)
BUN: 82 mg/dL — ABNORMAL HIGH (ref 8–23)
CO2: 27 mmol/L (ref 22–32)
Calcium: 8 mg/dL — ABNORMAL LOW (ref 8.9–10.3)
Chloride: 98 mmol/L (ref 98–111)
Creatinine, Ser: 2.97 mg/dL — ABNORMAL HIGH (ref 0.61–1.24)
GFR, Estimated: 21 mL/min — ABNORMAL LOW (ref >60)
Glucose, Bld: 127 mg/dL — ABNORMAL HIGH (ref 70–99)
Phosphorus: 6.2 mg/dL — ABNORMAL HIGH (ref 2.5–4.6)
Potassium: 3.7 mmol/L (ref 3.5–5.1)
Sodium: 138 mmol/L (ref 135–145)

## 2023-06-09 LAB — CBC
HCT: 30.3 % — ABNORMAL LOW (ref 39.0–52.0)
Hemoglobin: 9.1 g/dL — ABNORMAL LOW (ref 13.0–17.0)
MCH: 26.5 pg (ref 26.0–34.0)
MCHC: 30 g/dL (ref 30.0–36.0)
MCV: 88.3 fL (ref 80.0–100.0)
Platelets: 166 10*3/uL (ref 150–400)
RBC: 3.43 MIL/uL — ABNORMAL LOW (ref 4.22–5.81)
RDW: 17.7 % — ABNORMAL HIGH (ref 11.5–15.5)
WBC: 10.3 10*3/uL (ref 4.0–10.5)
nRBC: 0.2 % (ref 0.0–0.2)

## 2023-06-09 LAB — MAGNESIUM: Magnesium: 2.3 mg/dL (ref 1.7–2.4)

## 2023-06-09 LAB — APTT
aPTT: 119 s — ABNORMAL HIGH (ref 24–36)
aPTT: 63 s — ABNORMAL HIGH (ref 24–36)
aPTT: 79 s — ABNORMAL HIGH (ref 24–36)

## 2023-06-09 LAB — GLUCOSE, CAPILLARY
Glucose-Capillary: 124 mg/dL — ABNORMAL HIGH (ref 70–99)
Glucose-Capillary: 123 mg/dL — ABNORMAL HIGH (ref 70–99)
Glucose-Capillary: 101 mg/dL — ABNORMAL HIGH (ref 70–99)
Glucose-Capillary: 103 mg/dL — ABNORMAL HIGH (ref 70–99)
Glucose-Capillary: 96 mg/dL (ref 70–99)
Glucose-Capillary: 99 mg/dL (ref 70–99)

## 2023-06-09 LAB — LACTIC ACID, PLASMA: Lactic Acid, Venous: 1.7 mmol/L (ref 0.5–1.9)

## 2023-06-09 LAB — COOXEMETRY PANEL
Carboxyhemoglobin: 1.4 % (ref 0.5–1.5)
Methemoglobin: <0.7 % (ref 0.0–1.5)
O2 Saturation: 72.9 %
Total hemoglobin: 9.6 g/dL — ABNORMAL LOW (ref 12.0–16.0)
Total oxygen content: 71.4 %

## 2023-06-09 LAB — HEPARIN LEVEL (UNFRACTIONATED): Heparin Unfractionated: 0.43 [IU]/mL (ref 0.30–0.70)

## 2023-06-09 NOTE — Progress Notes (Signed)
 Heart Failure Navigator Progress Note  Assessed for Heart & Vascular TOC clinic readiness.  Patient does not meet criteria due to current Advanced Heart Failure Team patient of Laurey Morale, MD.  Navigator will sign off at this time.  Roxy Horseman, RN, BSN [facility] Heart Failure Navigator Secure Chat Only

## 2023-06-09 NOTE — Progress Notes (Addendum)
 PHARMACY - ANTICOAGULATION CONSULT NOTE  Pharmacy Consult for Heparin Infusion- per MD, no bolus Indication: DVT  Allergies  Allergen Reactions   Ace Inhibitors Cough    Patient Measurements: Height: 5\' 10"  (177.8 cm) Weight: 70.3 kg (154 lb 15.7 oz) IBW/kg (Calculated) : 73 HEPARIN DW (KG): 71.2  Vital Signs: Temp: 97.6 F (36.4 C) (04/02 0400) Temp Source: Axillary (04/02 0400) BP: 106/64 (04/02 0617) Pulse Rate: 59 (04/02 0617)  Labs: Recent Labs    06/07/23 1542 06/07/23 1542 06/07/23 1730 06/07/23 1953 06/07/23 2139 06/08/23 0214 06/08/23 0413 06/08/23 0549 06/08/23 1400 06/08/23 2359 06/09/23 0443  HGB 10.4*  --   --   --   --   --  10.0*  --   --   --  9.1*  HCT 34.2*  --   --   --   --   --  31.5*  --   --   --  30.3*  PLT 304  --   --   --   --   --  258  --   --   --  166  APTT  --    < >  --  35  --   --  45*  --  37* 63*  --   LABPROT 26.5*  --   --   --   --   --   --   --   --   --   --   INR 2.4*  --   --   --   --   --   --   --   --   --   --   HEPARINUNFRC  --   --   --  0.46  --   --  0.44  --   --   --   --   CREATININE 2.86*  --   --   --  2.89*  --  2.99* 3.00*  --   --  2.97*  TROPONINIHS 395*  --  377*  --  390* 404*  --   --   --   --   --    < > = values in this interval not displayed.    Estimated Creatinine Clearance: 19.7 mL/min (A) (by C-G formula based on SCr of 2.97 mg/dL (H)).   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    Chronic anemia    COVID-19    Depression    Dyslipidemia    Dyspnea    ED (erectile dysfunction)    Elevated PSA    Falls    Gross hematuria    History of methicillin resistant staphylococcus aureus (MRSA) 02/2022   + PCR nasal swab   Hx of hypotension    was placed on  midodrine and then taken off   Hypertension    Interstitial lung disease (HCC)    Ischemic cardiomyopathy    with left ventricular ejection fraction of 35%   Left ventricular aneurysm    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 Last dose 3/30 @ 1300.  Assessment: Patient is a 83 year old male with a past medical history of CHF with reduced ejection fraction, chronic MAI infection, bronchiectasis, ILD, CAD status post CABG, PE on Eliquis, presenting with shortness of breath and leg swelling. Pharmacy has been consulted to initiate patient on a heparin infusion for VTE treatment.  Goal of Therapy:  Heparin level 0.3-0.7 units/ml aPTT 66-102 seconds Monitor platelets by anticoagulation protocol: Yes    Plan: aPTT supratherapeutic following recent rate adjustment (anti-Xa level not quite correlating) reduce heparin infusion to a rate of 1350 units/hr Will monitor via aPTT levels until heparin level correlates, then can transition to heparin level monitoring only Recheck aPTT in 8 hours after rate change, heparin levels daily until correlating with aPTT Monitor CBC daily while on heparin   Burnis Medin, PharmD, BCPS 06/09/2023 7:01 AM

## 2023-06-09 NOTE — Progress Notes (Signed)
 PHARMACY CONSULT NOTE - FOLLOW UP  Pharmacy Consult for Electrolyte Monitoring and Replacement   Recent Labs: Potassium (mmol/L)  Date Value  06/09/2023 3.7  10/24/2012 3.7   Magnesium (mg/dL)  Date Value  16/12/9602 2.3   Calcium (mg/dL)  Date Value  54/11/8117 8.0 (L)   Calcium, Total (mg/dL)  Date Value  14/78/2956 9.3   Albumin (g/dL)  Date Value  21/30/8657 2.8 (L)  10/24/2012 3.4   Phosphorus (mg/dL)  Date Value  84/69/6295 6.2 (H)   Sodium (mmol/L)  Date Value  06/09/2023 138  10/24/2012 138     Assessment: Patient is a 83 year old male with a past medical history of CHF with reduced ejection fraction, chronic MAI infection, bronchiectasis, ILD, CAD status post CABG, PE on Eliquis, presenting with shortness of breath and leg swelling. Pharmacy has been consulted to monitor and replace electrolytes as needed via CCM protocol.   Goal of Therapy:  Electrolytes wnl  Plan:  No electrolyte replacement warranted for today Monitor BMP with AM labs  Lowella Bandy ,PharmD Clinical Pharmacist 06/09/2023 7:01 AM

## 2023-06-09 NOTE — Consult Note (Signed)
 Consultation Note Date: 06/09/2023 at 1100  Patient Name: Nicholas Alvarez  DOB: 12/15/1940  MRN: [id number]  Age / Sex: 83 y.o., male  PCP: Kandyce Rud, MD Referring Physician: Raechel Chute, MD  HPI/Patient Profile: 83 y.o. male  with past medical history of CHF with reduced ejection fraction, chronic MAI infection, bronchiectasis, ILD, CAD status post CABG, PE on Eliquis. He presented to ED via EMS c/o SOB and cough with leg swelling. On scene he was 80s on RA.   In the ED, he was found to be hypoxic and hypotensive. Workup showed elevated troponin 395, significantly elevated LFTs, lactic 5.0, creat 2.86 (0.81 baseline). CXR showed "stable findings of chronic bronchiectasis and bilateral scarring, previously characterized as sequela of chronic indolent infection such as an ARI. Trace bilateral pleural effusions."   Due to respiratory failure, he was placed on BiPAP and subsequently admitted to ICU.  PMT consulted for goals of care.   Clinical Assessment and Goals of Care: Extensive chart review completed prior to meeting patient including labs, vital signs, imaging, progress notes, orders, and available advanced directive documents from current and previous encounters. I then met with patient and his wife to discuss diagnosis prognosis, GOC, EOL wishes, disposition and options.  I introduced Palliative Medicine as specialized medical care for people living with serious illness. It focuses on providing relief from the symptoms and stress of a serious illness. The goal is to improve quality of life for both the patient and the family.  Elderly, ill-appearing male resting in bed. He is A&O, calm and pleasant. He frequently falls asleep during visit. Wife at bedside.   He denies pain, SOB or nausea. He has no complaints.   We discussed a brief life review of the patient. [name] shares that she and patient have  been married for over 35 years. 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 They have 3 sons that reside in [place]. She states they relocated to [facility] after a religious calling.   As far as functional and nutritional status [name] reports that Mr. Mclennan is mostly independent at home. She drives him to all of his appointments and runs all of the errands. [name] shares that her husband has experienced a significant decrease in appetite with weight loss over the past month.   We discussed patient's current illness and what it means in the larger context of patient's on-going co-morbidities.  Natural disease trajectory and expectations at EOL were discussed. [name] verbalizes the significance and seriousness of her husbands current illness with underlying heart and renal failure, not being a candidate for dialysis, along with the need for medication to maintain blood pressure.   I attempted to elicit values and goals of care important to the patient. She is unsure of her husbands goals at this time. She expresses the need to further discuss with her husband since she states they have never really discussed before. Also wants to allow time for outcomes.   The difference between aggressive medical intervention and comfort care was considered in  light of the patient's goals of care.   Advance directives, concepts specific to code status and rehospitalization were considered and discussed. She confirms that her husband would not want CPR or to be on breathing machine.   Education offered regarding concept specific to human mortality and the limitations of medical interventions to prolong life when the body begins to fail to thrive.  Family is facing treatment option decisions, advanced directive, and anticipatory care needs. [name] is the patient's primary decision maker in the event he is unable to make decisions for himself.    Discussed with patient/family the importance of continued conversation with family and  the medical providers regarding overall plan of care and treatment options, ensuring decisions are within the context of the patient's values and GOCs.    Spiritual care consult offered but deferred.  Questions and concerns were addressed. The family was encouraged to call with questions or concerns.   Primary Decision Maker PATIENT  Physical Exam Vitals reviewed.  Constitutional:      General: He is not in acute distress.    Appearance: He is ill-appearing.  HENT:     Nose:     Comments: 4 L O2 via [place] Pulmonary:     Effort: Pulmonary effort is normal. No respiratory distress.  Musculoskeletal:     Right lower leg: Edema present.     Left lower leg: Edema present.  Skin:    General: Skin is warm and dry.  Neurological:     Mental Status: He is alert and oriented to person, place, and time.  Psychiatric:        Mood and Affect: Mood normal.        Behavior: Behavior normal.     Palliative Assessment/Data: 50%     Thank you for this consult. Palliative medicine will continue to follow and assist holistically.   Time Total: 75 minutes  Time spent includes: Detailed review of medical records (labs, imaging, vital signs), medically appropriate exam (mental status, respiratory, cardiac, skin), discussed with treatment team, counseling and educating patient, family and staff, documenting clinical information, medication management and coordination of care.     Alex Gardener, Juel Burrow [facility] Palliative Medicine Team  06/09/2023 11:41 AM  Office [phone]  Pager [phone]     Please contact Palliative Medicine Team providers via AMION for questions and concerns.

## 2023-06-09 NOTE — Progress Notes (Addendum)
 0800 Amy NP in to see patient. 0830 Dr. Shirlee Latch in to see patient. Verbal order to discontinue Lasix drip and cut the Milniron drip in half.0900 Patient also changed from BiPAP to 4L [place] as well. Patient confused on where his wife was and the month of the year. Said he just wanted to sleep. 1030 Wife in to see patient.1200 Wife informed of poor prognosis. 1600 Chaplain in to visit with patient and wife. Chaplain conveys that patient and wife understand patient's grave prognosis but choose to put their trust in God for healing. Their wish is for the patient to return home to die.1800 Friends in to visit-refused mobility.

## 2023-06-09 NOTE — Progress Notes (Signed)
   06/09/23 1600  Spiritual Encounters  Type of Visit Follow up  Care provided to: Pt and family (Wife at bedside)  Referral source Chaplain assessment  Reason for visit Urgent spiritual support  OnCall Visit Yes  Spiritual Framework  Presenting Themes Meaning/purpose/sources of inspiration;Goals in life/care;Values and beliefs;Significant life change;Courage hope and growth;Rituals and practive  Interventions  Spiritual Care Interventions Made Compassionate presence;Reflective listening;Narrative/life review;Explored values/beliefs/practices/strengths;Prayer;Encouragement  Intervention Outcomes  Outcomes Connection to spiritual care;Awareness around self/spiritual resourses;Awareness of health;Awareness of support;Other (comment) (Both Pt and Wife are aware of Pt's medical status but are still asking for God to intervene w/healing. They understand that when everything medical can be done they will take him home.)

## 2023-06-09 NOTE — Progress Notes (Signed)
 PHARMACY - ANTICOAGULATION CONSULT NOTE  Pharmacy Consult for Heparin Infusion- per MD, no bolus Indication: DVT  Patient Measurements: Height: 5\' 10"  (177.8 cm) Weight: 70.3 kg (154 lb 15.7 oz) IBW/kg (Calculated) : 73 HEPARIN DW (KG): 71.2  Labs: Recent Labs    06/07/23 1542 06/07/23 1542 06/07/23 1730 06/07/23 1953 06/07/23 2139 06/08/23 0214 06/08/23 0413 06/08/23 0549 06/08/23 1400 06/08/23 2359 06/09/23 0443 06/09/23 1006 06/09/23 2105  HGB 10.4*  --   --   --   --   --  10.0*  --   --   --  9.1*  --   --   HCT 34.2*  --   --   --   --   --  31.5*  --   --   --  30.3*  --   --   PLT 304  --   --   --   --   --  258  --   --   --  166  --   --   APTT  --    < >  --  35  --   --  45*  --    < > 63*  --  119* 79*  LABPROT 26.5*  --   --   --   --   --   --   --   --   --   --   --   --   INR 2.4*  --   --   --   --   --   --   --   --   --   --   --   --   HEPARINUNFRC  --   --   --  0.46  --   --  0.44  --   --   --   --  0.43  --   CREATININE 2.86*  --   --   --  2.89*  --  2.99* 3.00*  --   --  2.97*  --   --   TROPONINIHS 395*  --  377*  --  390* 404*  --   --   --   --   --   --   --    < > = values in this interval not displayed.    Estimated Creatinine Clearance: 19.7 mL/min (A) (by C-G formula based on SCr of 2.97 mg/dL (H)).   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    Chronic anemia    COVID-19    Depression    Dyslipidemia    Dyspnea    ED (erectile dysfunction)    Elevated PSA    Falls    Gross hematuria    History of methicillin resistant staphylococcus aureus (MRSA) 02/2022   + PCR nasal swab   Hx of hypotension    was placed on midodrine and then taken off   Hypertension    Interstitial lung disease (HCC)     Ischemic cardiomyopathy    with left ventricular ejection fraction of 35%   Left ventricular aneurysm    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 Last dose 3/30 @ 1300.  Assessment: Patient is a 83 year old male with a past medical history of CHF with reduced ejection fraction, chronic MAI infection, bronchiectasis, ILD, CAD status post CABG, PE on Eliquis, presenting with shortness of breath and leg swelling. Pharmacy has been consulted to initiate patient on a heparin infusion for VTE treatment.  Goal of Therapy:  Heparin level 0.3-0.7 units/ml aPTT 66-102 seconds Monitor platelets by anticoagulation protocol: Yes    Plan:  --aPTT therapeutic x 1 --Continue heparin infusion at 1350 units/hr --Will monitor via aPTT levels until heparin level correlates, then can transition to heparin level monitoring only --Recheck aPTT in 8 hours, heparin levels daily until correlating with aPTT --Monitor CBC daily while on heparin   Tressie Ellis 06/09/2023 9:26 PM

## 2023-06-09 NOTE — Progress Notes (Signed)
 [facility] [place], [place] 06/09/23  Subjective:   Hospital day # 2  Nicholas Alvarez is a 83 y.o. 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  He has multiple medical problems including coronary artery disease status post CABG, BPH, hypertension, interstitial lung disease, Sjogren syndrome, bronchiectasis.  Congestive heart failure, chronic anemia.  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   Today he is doing a little better.  He is off BiPAP.  Urine output of 1150 cc yesterday.  Continued on inotropes.  His wife is at bedside.  Serum creatinine about the same as yesterday.  Renal: 04/01 0701 - 04/02 0700 In: 816.2 [I.V.:816.2] Out: 1150 [Urine:1150] Lab Results  Component Value Date   CREATININE 2.97 (H) 06/09/2023   CREATININE 3.00 (H) 06/08/2023   CREATININE 2.99 (H) 06/08/2023     Objective:  Vital signs in last 24 hours:  Temp:  [97.6 F (36.4 C)-98.4 F (36.9 C)] 98.4 F (36.9 C) (04/02 0800) Pulse Rate:  [25-95] 66 (04/02 1200) Resp:  [13-27] 17 (04/02 1200) BP: (73-122)/(29-72) 101/55 (04/02 1200) SpO2:  [93 %-100 %] 98 % (04/02 1200) FiO2 (%):  [30 %-40 %] 30 % (04/02 0800) Weight:  [70.3 kg] 70.3 kg (04/02 0449)  Weight change: -0.915 kg Filed Weights   06/07/23 1536 06/08/23 0500 06/09/23 0449  Weight: 71.2 kg 70.5 kg 70.3 kg    Intake/Output:    Intake/Output Summary (Last 24 hours) at 06/09/2023 1456 Last data filed at 06/09/2023 0800 Gross per 24 hour  Intake 605.94 ml  Output 1350 ml  Net -744.06 ml     Physical Exam: General: Ill-appearing, laying in the bed  HEENT Jud O2, moist oral mucous membranes  Pulm/lungs Bilateral diffuse crackles  CVS/Heart Irregular rhythm, tachycardic  Abdomen:  Soft, nontender, nondistended  Extremities: Trace to 1+ dependent edema  Neurologic: Able to follow simple commands  Skin: Scattered ecchymosis          Basic Metabolic  Panel:  Recent Labs  Lab 06/07/23 1542 06/07/23 2139 06/08/23 0214 06/08/23 0413 06/08/23 0549 06/08/23 1004 06/09/23 0443  NA 140 136  --  140 141  --  138  K 5.1 5.0 4.9 4.9 4.8 4.8 3.7  CL 100 101  --  102 102  --  98  CO2 23 19*  --  22 23  --  27  GLUCOSE 43* 95  --  95 94  --  127*  BUN 70* 71*  --  76* 76*  --  82*  CREATININE 2.86* 2.89*  --  2.99* 3.00*  --  2.97*  CALCIUM 9.1 8.7*  --  8.7* 8.4*  --  8.0*  MG  --  2.3  --  2.6*  --   --  2.3  PHOS  --  6.8*  --   --  6.9*  --  6.2*     CBC: Recent Labs  Lab 06/07/23 1542 06/08/23 0413 06/09/23 0443  WBC 11.5* 11.6* 10.3  NEUTROABS 9.4* 9.6*  --   HGB 10.4* 10.0* 9.1*  HCT 34.2* 31.5* 30.3*  MCV 87.5 84.0 88.3  PLT 304 258 166      Lab Results  Component Value Date   HEPBSAG NON REACTIVE 06/07/2023   HEPBIGM NON REACTIVE 06/07/2023      Microbiology:  Recent Results (from the past 240 hours)  Culture, blood (routine x 2)     Status: Abnormal (Preliminary result)  Collection Time: 06/07/23  3:41 PM   Specimen: BLOOD  Result Value Ref Range Status   Specimen Description   Final    BLOOD LAC Performed at [facility], [address] [place], [place] [address]    Special Requests   Final    BOTTLES DRAWN AEROBIC AND ANAEROBIC Blood Culture results may not be optimal due to an inadequate volume of blood received in culture bottles Performed at [facility], [address], [place], [place] [address]    Culture  Setup Time   Final    GRAM POSITIVE COCCI ANAEROBIC BOTTLE ONLY CRITICAL RESULT CALLED TO, READ BACK BY AND VERIFIED WITH: WILL ANDERSON 06/08/23 1025 MW    Culture (A)  Final    STAPHYLOCOCCUS EPIDERMIDIS THE SIGNIFICANCE OF ISOLATING THIS ORGANISM FROM A SINGLE SET OF BLOOD CULTURES WHEN MULTIPLE SETS ARE DRAWN IS UNCERTAIN. PLEASE NOTIFY THE MICROBIOLOGY DEPARTMENT WITHIN ONE WEEK IF SPECIATION AND SENSITIVITIES ARE REQUIRED. Performed at [facility],  [address], [place], [place] [address]    Report Status PENDING  Incomplete  Blood Culture ID Panel (Reflexed)     Status: Abnormal   Collection Time: 06/07/23  3:41 PM  Result Value Ref Range Status   Enterococcus faecalis NOT DETECTED NOT DETECTED Final   Enterococcus Faecium NOT DETECTED NOT DETECTED Final   Listeria monocytogenes NOT DETECTED NOT DETECTED Final   Staphylococcus species DETECTED (A) NOT DETECTED Final    Comment: CRITICAL RESULT CALLED TO, READ BACK BY AND VERIFIED WITH: WILL ANDERSON 06/08/23 1025 MW    Staphylococcus aureus (BCID) NOT DETECTED NOT DETECTED Final   Staphylococcus epidermidis DETECTED (A) NOT DETECTED Final    Comment: Methicillin (oxacillin) resistant coagulase negative staphylococcus. Possible blood culture contaminant (unless isolated from more than one blood culture draw or clinical case suggests pathogenicity). No antibiotic treatment is indicated for blood  culture contaminants. CRITICAL RESULT CALLED TO, READ BACK BY AND VERIFIED WITH: WILL ANDERSON 06/08/23 1025 MW    Staphylococcus lugdunensis NOT DETECTED NOT DETECTED Final   Streptococcus species NOT DETECTED NOT DETECTED Final   Streptococcus agalactiae NOT DETECTED NOT DETECTED Final   Streptococcus pneumoniae NOT DETECTED NOT DETECTED Final   Streptococcus pyogenes NOT DETECTED NOT DETECTED Final   A.calcoaceticus-baumannii NOT DETECTED NOT DETECTED Final   Bacteroides fragilis NOT DETECTED NOT DETECTED Final   Enterobacterales NOT DETECTED NOT DETECTED Final   Enterobacter cloacae complex NOT DETECTED NOT DETECTED Final   Escherichia coli NOT DETECTED NOT DETECTED Final   Klebsiella aerogenes NOT DETECTED NOT DETECTED Final   Klebsiella oxytoca NOT DETECTED NOT DETECTED Final   Klebsiella pneumoniae NOT DETECTED NOT DETECTED Final   Proteus species NOT DETECTED NOT DETECTED Final   Salmonella species NOT DETECTED NOT DETECTED Final   Serratia marcescens NOT DETECTED NOT DETECTED Final    Haemophilus influenzae NOT DETECTED NOT DETECTED Final   Neisseria meningitidis NOT DETECTED NOT DETECTED Final   Pseudomonas aeruginosa NOT DETECTED NOT DETECTED Final   Stenotrophomonas maltophilia NOT DETECTED NOT DETECTED Final   Candida albicans NOT DETECTED NOT DETECTED Final   Candida auris NOT DETECTED NOT DETECTED Final   Candida glabrata NOT DETECTED NOT DETECTED Final   Candida krusei NOT DETECTED NOT DETECTED Final   Candida parapsilosis NOT DETECTED NOT DETECTED Final   Candida tropicalis NOT DETECTED NOT DETECTED Final   Cryptococcus neoformans/gattii NOT DETECTED NOT DETECTED Final   Methicillin resistance mecA/C DETECTED (A) NOT DETECTED Final    Comment:  CRITICAL RESULT CALLED TO, READ BACK BY AND VERIFIED WITH: WILL ANDERSON 06/08/23 1025 MW Performed at [facility], [address] [place], [place] [address]   Resp panel by RT-PCR (RSV, Flu A&B, Covid) Anterior Nasal Swab     Status: None   Collection Time: 06/07/23  3:42 PM   Specimen: Anterior Nasal Swab  Result Value Ref Range Status   SARS Coronavirus 2 by RT PCR NEGATIVE NEGATIVE Final    Comment: (NOTE) SARS-CoV-2 target nucleic acids are NOT DETECTED.  The SARS-CoV-2 RNA is generally detectable in upper respiratory specimens during the acute phase of infection. The lowest concentration of SARS-CoV-2 viral copies this assay can detect is 138 copies/mL. A negative result does not preclude SARS-Cov-2 infection and should not be used as the sole basis for treatment or other patient management decisions. A negative result may occur with  improper specimen collection/handling, submission of specimen other than nasopharyngeal swab, presence of viral mutation(s) within the areas targeted by this assay, and inadequate number of viral copies(<138 copies/mL). A negative result must be combined with clinical observations, patient history, and epidemiological information. The expected result is  Negative.  Fact Sheet for Patients:  BloggerCourse.com  Fact Sheet for Healthcare Providers:  SeriousBroker.it  This test is no t yet approved or cleared by the [place] FDA and  has been authorized for detection and/or diagnosis of SARS-CoV-2 by FDA under an Emergency Use Authorization (EUA). This EUA will remain  in effect (meaning this test can be used) for the duration of the COVID-19 declaration under Section 564(b)(1) of the Act, 21 U.S.C.section 360bbb-3(b)(1), unless the authorization is terminated  or revoked sooner.       Influenza A by PCR NEGATIVE NEGATIVE Final   Influenza B by PCR NEGATIVE NEGATIVE Final    Comment: (NOTE) The Xpert Xpress SARS-CoV-2/FLU/RSV plus assay is intended as an aid in the diagnosis of influenza from Nasopharyngeal swab specimens and should not be used as a sole basis for treatment. Nasal washings and aspirates are unacceptable for Xpert Xpress SARS-CoV-2/FLU/RSV testing.  Fact Sheet for Patients: BloggerCourse.com  Fact Sheet for Healthcare Providers: SeriousBroker.it  This test is not yet approved or cleared by the [place] FDA and has been authorized for detection and/or diagnosis of SARS-CoV-2 by FDA under an Emergency Use Authorization (EUA). This EUA will remain in effect (meaning this test can be used) for the duration of the COVID-19 declaration under Section 564(b)(1) of the Act, 21 U.S.C. section 360bbb-3(b)(1), unless the authorization is terminated or revoked.     Resp Syncytial Virus by PCR NEGATIVE NEGATIVE Final    Comment: (NOTE) Fact Sheet for Patients: BloggerCourse.com  Fact Sheet for Healthcare Providers: SeriousBroker.it  This test is not yet approved or cleared by the [place] FDA and has been authorized for detection and/or diagnosis of  SARS-CoV-2 by FDA under an Emergency Use Authorization (EUA). This EUA will remain in effect (meaning this test can be used) for the duration of the COVID-19 declaration under Section 564(b)(1) of the Act, 21 U.S.C. section 360bbb-3(b)(1), unless the authorization is terminated or revoked.  Performed at [facility], [address] [place], [place] [address]   Culture, blood (routine x 2)     Status: None (Preliminary result)   Collection Time: 06/07/23  3:42 PM   Specimen: BLOOD  Result Value Ref Range Status   Specimen Description BLOOD RAC  Final   Special Requests   Final    BOTTLES DRAWN  AEROBIC AND ANAEROBIC Blood Culture results may not be optimal due to an inadequate volume of blood received in culture bottles   Culture   Final    NO GROWTH 2 DAYS Performed at [facility], [address] [place], [place] [address]    Report Status PENDING  Incomplete  MRSA Next Gen by PCR, Nasal     Status: Abnormal   Collection Time: 06/07/23  3:42 PM   Specimen: Nasal Mucosa; Nasal Swab  Result Value Ref Range Status   MRSA by PCR Next Gen DETECTED (A) NOT DETECTED Final    Comment: CRITICAL RESULT CALLED TO, READ BACK BY AND VERIFIED WITH: TUCKER, S. 2050 06/07/23 LRL (NOTE) The GeneXpert MRSA Assay (FDA approved for NASAL specimens only), is one component of a comprehensive MRSA colonization surveillance program. It is not intended to diagnose MRSA infection nor to guide or monitor treatment for MRSA infections. Test performance is not FDA approved in patients less than 30 years old. Performed at [facility], [address] [place], [place] [address]     Coagulation Studies: Recent Labs    06/07/23 1542  LABPROT 26.5*  INR 2.4*    Urinalysis: No results for input(s): "COLORURINE", "LABSPEC", "PHURINE", "GLUCOSEU", "HGBUR", "BILIRUBINUR", "KETONESUR", "PROTEINUR", "UROBILINOGEN", "NITRITE", "LEUKOCYTESUR" in the last 72 hours.  Invalid  input(s): "APPERANCEUR"    Imaging: DG Chest 1 View Result Date: 06/08/2023 CLINICAL DATA:  Central line placement EXAM: CHEST  1 VIEW COMPARISON:  06/07/2023 FINDINGS: Left-sided central venous catheter extends to the proximal SVC. No pneumothorax. Coarse chronic interstitial opacities bilaterally with some patchy airspace disease in both upper lobes as before. Heart size and mediastinal contours are within normal limits. CABG markers. No effusion. Sternotomy wires. IMPRESSION: Left-sided central venous catheter to the proximal SVC. No pneumothorax. Electronically Signed   By: Corlis Leak M.D.   On: 06/08/2023 17:18   ECHOCARDIOGRAM COMPLETE Result Date: 06/08/2023    ECHOCARDIOGRAM REPORT   Patient Name:   Nicholas Alvarez [facility] Date of Exam: 06/08/2023 Medical Rec #:  [id number]      Height:       70.0 in Accession #:    [id number]     Weight:       155.4 lb Date of Birth:  1940/04/15      BSA:          1.875 m Patient Age:    82 years       BP:           111/59 mmHg Patient Gender: M              HR:           91 bpm. Exam Location:  [facility] Procedure: 2D Echo, 3D Echo, Cardiac Doppler, Color Doppler and Intracardiac            Opacification Agent (Both Spectral and Color Flow Doppler were            utilized during procedure). Indications:     Dyapnea  History:         Patient has prior history of Echocardiogram examinations, most                  recent 11/02/2022. CAD and Previous Myocardial Infarction, Prior                  CABG; Signs/Symptoms:Dyspnea. Pulmonary embolus.  Sonographer:     Mikki Harbor Referring Phys:  [id number] KHABIB DGAYLI Diagnosing Phys: Cristal Deer End  MD IMPRESSIONS  1. Left ventricular ejection fraction, by estimation, is <20%. Left ventricular ejection fraction by 3D volume is 12 %. The left ventricle has severely decreased function. The left ventricle demonstrates global hypokinesis. The left ventricular internal  cavity size was mildly dilated. There is moderate left ventricular  hypertrophy. Left ventricular diastolic parameters are consistent with Grade II diastolic dysfunction (pseudonormalization). Elevated left atrial pressure.  2. Right ventricular systolic function is severely reduced. The right ventricular size is moderately enlarged. There is severely elevated pulmonary artery systolic pressure.  3. Left atrial size was mildly dilated.  4. Right atrial size was mildly dilated.  5. Large pleural effusion in the left lateral region.  6. The mitral valve is abnormal. Mild to moderate mitral valve regurgitation.  7. The aortic valve is tricuspid. There is mild calcification of the aortic valve. There is moderate thickening of the aortic valve. Aortic valve regurgitation is not visualized. Aortic valve sclerosis/calcification is present, without any evidence of aortic stenosis.  8. Mildly dilated pulmonary artery. Conclusion(s)/Recommendation(s): No left ventricular mural or apical thrombus/thrombi. FINDINGS  Left Ventricle: Left ventricular ejection fraction, by estimation, is <20%. Left ventricular ejection fraction by 3D volume is 12 %. The left ventricle has severely decreased function. The left ventricle demonstrates global hypokinesis. Definity contrast agent was given IV to delineate the left ventricular endocardial borders. The left ventricular internal cavity size was mildly dilated. There is moderate left ventricular hypertrophy. Left ventricular diastolic parameters are consistent with Grade II diastolic dysfunction (pseudonormalization). Elevated left atrial pressure. Right Ventricle: The right ventricular size is moderately enlarged. No increase in right ventricular wall thickness. Right ventricular systolic function is severely reduced. There is severely elevated pulmonary artery systolic pressure. The tricuspid regurgitant velocity is 3.51 m/s, and with an assumed right atrial pressure of 15 mmHg, the estimated right ventricular systolic pressure is 64.3 mmHg. Left Atrium:  Left atrial size was mildly dilated. Right Atrium: Right atrial size was mildly dilated. Pericardium: There is no evidence of pericardial effusion. Mitral Valve: The mitral valve is abnormal. There is mild thickening of the mitral valve leaflet(s). Mild to moderate mitral valve regurgitation. MV peak gradient, 4.3 mmHg. The mean mitral valve gradient is 1.0 mmHg. Tricuspid Valve: The tricuspid valve is normal in structure. Tricuspid valve regurgitation is mild. Aortic Valve: The aortic valve is tricuspid. There is mild calcification of the aortic valve. There is moderate thickening of the aortic valve. Aortic valve regurgitation is not visualized. Aortic valve sclerosis/calcification is present, without any evidence of aortic stenosis. Aortic valve mean gradient measures 2.0 mmHg. Aortic valve peak gradient measures 4.3 mmHg. Aortic valve area, by VTI measures 2.98 cm. Pulmonic Valve: The pulmonic valve was normal in structure. Pulmonic valve regurgitation is trivial. No evidence of pulmonic stenosis. Aorta: The aortic root is normal in size and structure. Pulmonary Artery: The pulmonary artery is mildly dilated. IAS/Shunts: The interatrial septum was not well visualized. Additional Comments: 3D was performed not requiring image post processing on an independent workstation and was abnormal. There is a large pleural effusion in the left lateral region.  LEFT VENTRICLE PLAX 2D LVIDd:         5.60 cm         Diastology LVIDs:         5.10 cm         LV e' medial:    5.03 cm/s LV PW:         1.30 cm  LV E/e' medial:  17.5 LV IVS:        1.20 cm         LV e' lateral:   7.58 cm/s LVOT diam:     2.00 cm         LV E/e' lateral: 11.6 LV SV:         52 LV SV Index:   27 LVOT Area:     3.14 cm        3D Volume EF                                LV 3D EF:    Left                                             ventricul                                             ar                                             ejection                                              fraction                                             by 3D                                             volume is                                             12 %.                                 3D Volume EF:                                3D EF:        12 % RIGHT VENTRICLE RV Basal diam:  4.90 cm LEFT ATRIUM             Index        RIGHT ATRIUM           Index LA diam:        5.20 cm 2.77 cm/m   RA Area:     21.30 cm LA Vol (A2C):   97.3 ml 51.89 ml/m  RA Volume:  75.30 ml  40.16 ml/m LA Vol (A4C):   79.7 ml 42.50 ml/m LA Biplane Vol: 88.7 ml 47.30 ml/m  AORTIC VALVE                    PULMONIC VALVE AV Area (Vmax):    3.09 cm     PV Vmax:       0.69 m/s AV Area (Vmean):   2.49 cm     PV Peak grad:  1.9 mmHg AV Area (VTI):     2.98 cm AV Vmax:           103.85 cm/s AV Vmean:          60.100 cm/s AV VTI:            0.173 m AV Peak Grad:      4.3 mmHg AV Mean Grad:      2.0 mmHg LVOT Vmax:         102.00 cm/s LVOT Vmean:        47.700 cm/s LVOT VTI:          0.164 m LVOT/AV VTI ratio: 0.95  AORTA Ao Root diam: 3.70 cm MITRAL VALVE               TRICUSPID VALVE MV Area (PHT): 7.02 cm    TR Peak grad:   49.3 mmHg MV Area VTI:   1.51 cm    TR Vmax:        351.00 cm/s MV Peak grad:  4.3 mmHg MV Mean grad:  1.0 mmHg    SHUNTS MV Vmax:       1.04 m/s    Systemic VTI:  0.16 m MV Vmean:      44.1 cm/s   Systemic Diam: 2.00 cm MV Decel Time: 108 msec MV E velocity: 88.00 cm/s MV A velocity: 46.10 cm/s MV E/A ratio:  1.91 Cristal Deer End MD Electronically signed by Yvonne Kendall MD Signature Date/Time: 06/08/2023/10:11:45 AM    Final    US RENAL Result Date: 06/07/2023 CLINICAL DATA:  Acute renal insufficiency EXAM: RENAL / URINARY TRACT ULTRASOUND COMPLETE COMPARISON:  10/30/2022 FINDINGS: Right Kidney: Not visualized. Left Kidney: Renal measurements: 8.8 x 4.1 x 3.7 cm = volume: 69.1 mL. Echogenicity within normal limits. No mass or hydronephrosis visualized. Bladder:  Not visualized. Other: Examination is extremely limited due to patient cooperation and refusal to complete the evaluation. Incidental note is made of a left pleural effusion. IMPRESSION: 1. Limited study due to patient cooperation and refusal to complete the examination. The right kidney and bladder are not visualized. 2. Unremarkable left kidney. Electronically Signed   By: Sharlet Salina M.D.   On: 06/07/2023 21:32   US Abdomen Limited RUQ (LIVER/GB) Result Date: 06/07/2023 CLINICAL DATA:  Transaminitis. EXAM: ULTRASOUND ABDOMEN LIMITED RIGHT UPPER QUADRANT COMPARISON:  None Available. FINDINGS: Gallbladder: No gallstones or wall thickening visualized (1.5 mm). No sonographic Murphy sign noted by sonographer. Common bile duct: Diameter: 2.0 mm Liver: No focal lesion identified. Within normal limits in parenchymal echogenicity. Portal vein is patent on color Doppler imaging with normal direction of blood flow towards the liver. Other: The study is technically limited secondary to inability of the patient to hold breath or lay flat during the exam, as per the ultrasound technologist. IMPRESSION: Unremarkable right upper quadrant ultrasound. Electronically Signed   By: Aram Candela M.D.   On: 06/07/2023 17:41   DG Chest Portable 1 View Result Date: 06/07/2023 CLINICAL DATA:  Short of breath,  respiratory distress EXAM: PORTABLE CHEST 1 VIEW COMPARISON:  10/30/2022 FINDINGS: Single frontal view of the chest demonstrates stable postsurgical changes from CABG. The cardiac silhouette is enlarged. Chronic areas of scarring and bronchiectasis are again seen throughout the lungs, with no new areas of consolidation identified. Trace bilateral pleural effusions. No pneumothorax. No acute bony abnormalities. IMPRESSION: 1. 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 2. Trace bilateral pleural effusions. 3. No acute  airspace disease. Electronically Signed   By: Sharlet Salina M.D.   On: 06/07/2023 16:33     Medications:    amiodarone 30 mg/hr (06/09/23 1451)   heparin 1,350 Units/hr (06/09/23 1257)   milrinone 0.125 mcg/kg/min (06/09/23 0849)    Chlorhexidine Gluconate Cloth  6 each Topical Daily   fentaNYL (SUBLIMAZE) injection  25 mcg Intravenous Once   docusate sodium, polyethylene glycol  Assessment/ Plan:  83 y.o. male with   medical problems of coronary disease status post CABG, severe CHF, interstitial lung disease, Sjogren's syndrome, bronchiectasis, history of MAI infection, BPH, hypertension admitted on 06/07/2023 for Shortness of breath [R06.02] Transaminitis [R74.01] Elevated troponin [R79.89] History of bronchiectasis [Z87.09] AKI (acute kidney injury) (HCC) [N17.9] History of MAI infection [Z86.19] Acute hypoxemic respiratory failure (HCC) [J96.01] Acute on chronic congestive heart failure, unspecified heart failure type (HCC) [I50.9] Cough, unspecified type [R05.9] Acute hypoxic respiratory failure (HCC) [J96.01]   1.  Acute kidney injury-likely secondary to cardiorenal syndrome, hypotension and severe CHF.  Patient is non-oliguric now.  Current creatinine is elevated.  Urine output of about 1100 cc yesterday. He will be tried on CHF regimen of milrinone, amiodarone, to optimize cardiac output. His latest 2D echo showed LVEF less than 20% With global hypokinesis, LVH, grade 2 diastolic dysfunction and severely reduced right ventricular function with severely elevated pulmonary artery systolic pressure.    No acute indication for dialysis at present.  We will continue to follow along and monitor progress.  Given severe CHF, dialysis would be difficult and may cause further hemodynamic compromise.   2.  Chronic kidney disease stage IIIb.  Baseline creatinine 1.79/GFR 38 from December 2024.   3.  Acute exacerbation of chronic severe systolic and diastolic CHF causing acute  respiratory failure.   -Inotropic regimen as per cardiology team.    LOS: 2 Mosetta Pigeon 4/2/20252:56 PM  [facility] [place], [place] [phone]  Note: This note was prepared with Dragon dictation. Any transcription errors are unintentional

## 2023-06-09 NOTE — Progress Notes (Addendum)
 Advanced Heart Failure Rounding Note  Cardiologist: None  Chief Complaint: Heart Failure Subjective:    4/1: Started on milrinone 0.25 mcg + lasix drip. Started on amio drip for SVT.   Creatinine unchanged at 3. Sluggish urine output.   Continue on Bipap overnight.   Objective:   Weight Range: 70.3 kg Body mass index is 22.24 kg/m.   Vital Signs:   Temp:  [97.6 F (36.4 C)-97.8 F (36.6 C)] 97.6 F (36.4 C) (04/02 0400) Pulse Rate:  [25-95] 70 (04/02 0705) Resp:  [13-27] 18 (04/02 0705) BP: (73-128)/(44-96) 99/50 (04/02 0705) SpO2:  [93 %-100 %] 97 % (04/02 0705) FiO2 (%):  [30 %-40 %] 30 % (04/02 0400) Weight:  [70.3 kg] 70.3 kg (04/02 0449) Last BM Date :  (PTA)  Weight change: Filed Weights   06/07/23 1536 06/08/23 0500 06/09/23 0449  Weight: 71.2 kg 70.5 kg 70.3 kg    Intake/Output:   Intake/Output Summary (Last 24 hours) at 06/09/2023 0710 Last data filed at 06/09/2023 0640 Gross per 24 hour  Intake 784.79 ml  Output 1150 ml  Net -365.21 ml     CVP 4-5  Physical Exam   General:  On Bipap. Elderly. Frail  Neck: JVP difficult to assess Cor: PMI nondisplaced. Regular rate & rhythm. No rubs, gallops or murmurs. Lungs: Decreased crackles in the bases Abdomen: soft, nontender, nondistended.  Extremities: no cyanosis, clubbing, rash, R and LLE 1+ edema Neuro: Drowsy on Bipap    Telemetry  SR with PACs/occasional PVCs.   EKG    N/A  Labs    CBC Recent Labs    06/07/23 1542 06/08/23 0413 06/09/23 0443  WBC 11.5* 11.6* 10.3  NEUTROABS 9.4* 9.6*  --   HGB 10.4* 10.0* 9.1*  HCT 34.2* 31.5* 30.3*  MCV 87.5 84.0 88.3  PLT 304 258 166   Basic Metabolic Panel Recent Labs    81/19/14 0413 06/08/23 0549 06/08/23 1004 06/09/23 0443  NA 140 141  --  138  K 4.9 4.8 4.8 3.7  CL 102 102  --  98  CO2 22 23  --  27  GLUCOSE 95 94  --  127*  BUN 76* 76*  --  82*  CREATININE 2.99* 3.00*  --  2.97*  CALCIUM 8.7* 8.4*  --  8.0*  MG 2.6*  --    --  2.3  PHOS  --  6.9*  --  6.2*   Liver Function Tests Recent Labs    06/07/23 2139 06/08/23 0413 06/08/23 0549 06/09/23 0443  AST 1,298* 1,424*  --   --   ALT 1,092* 1,235*  --   --   ALKPHOS 66 66  --   --   BILITOT 1.7* 1.9*  --   --   PROT 6.2* 6.6  --   --   ALBUMIN 3.0* 3.3* 3.1* 2.8*   No results for input(s): "LIPASE", "AMYLASE" in the last 72 hours. Cardiac Enzymes No results for input(s): "CKTOTAL", "CKMB", "CKMBINDEX", "TROPONINI" in the last 72 hours.  BNP: BNP (last 3 results) Recent Labs    06/07/23 1542 06/07/23 1953  BNP 3,164.6* 2,985.2*    ProBNP (last 3 results) No results for input(s): "PROBNP" in the last 8760 hours.   D-Dimer No results for input(s): "DDIMER" in the last 72 hours. Hemoglobin A1C No results for input(s): "HGBA1C" in the last 72 hours. Fasting Lipid Panel No results for input(s): "CHOL", "HDL", "LDLCALC", "TRIG", "CHOLHDL", "LDLDIRECT" in the last 72 hours.  Thyroid Function Tests Recent Labs    06/07/23 1730  TSH 2.029    Other results:   Imaging    DG Chest 1 View Result Date: 06/08/2023 CLINICAL DATA:  Central line placement EXAM: CHEST  1 VIEW COMPARISON:  06/07/2023 FINDINGS: Left-sided central venous catheter extends to the proximal SVC. No pneumothorax. Coarse chronic interstitial opacities bilaterally with some patchy airspace disease in both upper lobes as before. Heart size and mediastinal contours are within normal limits. CABG markers. No effusion. Sternotomy wires. IMPRESSION: Left-sided central venous catheter to the proximal SVC. No pneumothorax. Electronically Signed   By: Corlis Leak M.D.   On: 06/08/2023 17:18   ECHOCARDIOGRAM COMPLETE Result Date: 06/08/2023    ECHOCARDIOGRAM REPORT   Patient Name:   Nicholas Alvarez [facility] Date of Exam: 06/08/2023 Medical Rec #:  [id number]      Height:       70.0 in Accession #:    [id number]     Weight:       155.4 lb Date of Birth:  1940-09-09      BSA:          1.875 m Patient  Age:    83 years       BP:           111/59 mmHg Patient Gender: M              HR:           91 bpm. Exam Location:  [facility] Procedure: 2D Echo, 3D Echo, Cardiac Doppler, Color Doppler and Intracardiac            Opacification Agent (Both Spectral and Color Flow Doppler were            utilized during procedure). Indications:     Dyapnea  History:         Patient has prior history of Echocardiogram examinations, most                  recent 11/02/2022. CAD and Previous Myocardial Infarction, Prior                  CABG; Signs/Symptoms:Dyspnea. Pulmonary embolus.  Sonographer:     Mikki Harbor Referring Phys:  [id number] KHABIB DGAYLI Diagnosing Phys: Yvonne Kendall MD IMPRESSIONS  1. Left ventricular ejection fraction, by estimation, is <20%. Left ventricular ejection fraction by 3D volume is 12 %. The left ventricle has severely decreased function. The left ventricle demonstrates global hypokinesis. The left ventricular internal  cavity size was mildly dilated. There is moderate left ventricular hypertrophy. Left ventricular diastolic parameters are consistent with Grade II diastolic dysfunction (pseudonormalization). Elevated left atrial pressure.  2. Right ventricular systolic function is severely reduced. The right ventricular size is moderately enlarged. There is severely elevated pulmonary artery systolic pressure.  3. Left atrial size was mildly dilated.  4. Right atrial size was mildly dilated.  5. Large pleural effusion in the left lateral region.  6. The mitral valve is abnormal. Mild to moderate mitral valve regurgitation.  7. The aortic valve is tricuspid. There is mild calcification of the aortic valve. There is moderate thickening of the aortic valve. Aortic valve regurgitation is not visualized. Aortic valve sclerosis/calcification is present, without any evidence of aortic stenosis.  8. Mildly dilated pulmonary artery. Conclusion(s)/Recommendation(s): No left ventricular mural or apical  thrombus/thrombi. FINDINGS  Left Ventricle: Left ventricular ejection fraction, by estimation, is <20%. Left ventricular ejection fraction by 3D volume is 12 %. The left  ventricle has severely decreased function. The left ventricle demonstrates global hypokinesis. Definity contrast agent was given IV to delineate the left ventricular endocardial borders. The left ventricular internal cavity size was mildly dilated. There is moderate left ventricular hypertrophy. Left ventricular diastolic parameters are consistent with Grade II diastolic dysfunction (pseudonormalization). Elevated left atrial pressure. Right Ventricle: The right ventricular size is moderately enlarged. No increase in right ventricular wall thickness. Right ventricular systolic function is severely reduced. There is severely elevated pulmonary artery systolic pressure. The tricuspid regurgitant velocity is 3.51 m/s, and with an assumed right atrial pressure of 15 mmHg, the estimated right ventricular systolic pressure is 64.3 mmHg. Left Atrium: Left atrial size was mildly dilated. Right Atrium: Right atrial size was mildly dilated. Pericardium: There is no evidence of pericardial effusion. Mitral Valve: The mitral valve is abnormal. There is mild thickening of the mitral valve leaflet(s). Mild to moderate mitral valve regurgitation. MV peak gradient, 4.3 mmHg. The mean mitral valve gradient is 1.0 mmHg. Tricuspid Valve: The tricuspid valve is normal in structure. Tricuspid valve regurgitation is mild. Aortic Valve: The aortic valve is tricuspid. There is mild calcification of the aortic valve. There is moderate thickening of the aortic valve. Aortic valve regurgitation is not visualized. Aortic valve sclerosis/calcification is present, without any evidence of aortic stenosis. Aortic valve mean gradient measures 2.0 mmHg. Aortic valve peak gradient measures 4.3 mmHg. Aortic valve area, by VTI measures 2.98 cm. Pulmonic Valve: The pulmonic valve was  normal in structure. Pulmonic valve regurgitation is trivial. No evidence of pulmonic stenosis. Aorta: The aortic root is normal in size and structure. Pulmonary Artery: The pulmonary artery is mildly dilated. IAS/Shunts: The interatrial septum was not well visualized. Additional Comments: 3D was performed not requiring image post processing on an independent workstation and was abnormal. There is a large pleural effusion in the left lateral region.  LEFT VENTRICLE PLAX 2D LVIDd:         5.60 cm         Diastology LVIDs:         5.10 cm         LV e' medial:    5.03 cm/s LV PW:         1.30 cm         LV E/e' medial:  17.5 LV IVS:        1.20 cm         LV e' lateral:   7.58 cm/s LVOT diam:     2.00 cm         LV E/e' lateral: 11.6 LV SV:         52 LV SV Index:   27 LVOT Area:     3.14 cm        3D Volume EF                                LV 3D EF:    Left                                             ventricul  ar                                             ejection                                             fraction                                             by 3D                                             volume is                                             12 %.                                 3D Volume EF:                                3D EF:        12 % RIGHT VENTRICLE RV Basal diam:  4.90 cm LEFT ATRIUM             Index        RIGHT ATRIUM           Index LA diam:        5.20 cm 2.77 cm/m   RA Area:     21.30 cm LA Vol (A2C):   97.3 ml 51.89 ml/m  RA Volume:   75.30 ml  40.16 ml/m LA Vol (A4C):   79.7 ml 42.50 ml/m LA Biplane Vol: 88.7 ml 47.30 ml/m  AORTIC VALVE                    PULMONIC VALVE AV Area (Vmax):    3.09 cm     PV Vmax:       0.69 m/s AV Area (Vmean):   2.49 cm     PV Peak grad:  1.9 mmHg AV Area (VTI):     2.98 cm AV Vmax:           103.85 cm/s AV Vmean:          60.100 cm/s AV VTI:            0.173 m AV Peak Grad:      4.3 mmHg AV  Mean Grad:      2.0 mmHg LVOT Vmax:         102.00 cm/s LVOT Vmean:        47.700 cm/s LVOT VTI:          0.164 m LVOT/AV VTI ratio: 0.95  AORTA Ao Root diam: 3.70 cm MITRAL VALVE  TRICUSPID VALVE MV Area (PHT): 7.02 cm    TR Peak grad:   49.3 mmHg MV Area VTI:   1.51 cm    TR Vmax:        351.00 cm/s MV Peak grad:  4.3 mmHg MV Mean grad:  1.0 mmHg    SHUNTS MV Vmax:       1.04 m/s    Systemic VTI:  0.16 m MV Vmean:      44.1 cm/s   Systemic Diam: 2.00 cm MV Decel Time: 108 msec MV E velocity: 88.00 cm/s MV A velocity: 46.10 cm/s MV E/A ratio:  1.91 Christopher End MD Electronically signed by Yvonne Kendall MD Signature Date/Time: 06/08/2023/10:11:45 AM    Final      Medications:     Scheduled Medications:  Chlorhexidine Gluconate Cloth  6 each Topical Daily   fentaNYL (SUBLIMAZE) injection  25 mcg Intravenous Once    Infusions:  amiodarone 30 mg/hr (06/09/23 0640)   furosemide (LASIX) 200 mg in dextrose 5 % 100 mL (2 mg/mL) infusion 10 mg/hr (06/09/23 0640)   heparin 1,450 Units/hr (06/09/23 0640)   milrinone 0.25 mcg/kg/min (06/09/23 0640)    PRN Medications: docusate sodium, polyethylene glycol   Assessment/Plan  1. 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  Echo with EF 30% at that time.  Lost to cardiology followup for years. Echo in 8/24, however, showed EF 30-35%.  It sounds like he developed gradually worsening CHF symptoms over the last several months but became acutely worse over the last couple of weeks with orthopnea, NYHA class IV symptoms. Admitted with cardiogenic shock, lactate 5.  Echo this admission with severe biventricular failure, EF < 20% with no LV thrombus, mild LV dilation and mild LVH, moderate RV enlargement with severe RV dysfunction, mild-moderate MR.  Initially hypotensive, but SBP now 120s.  Creatinine up to 3, has elevated LFTs consistent with shock liver. ACS unlikely, HS-TnI mildly elevated  with no trend.  Lactate acid clearing.  Pressures remain soft. Cut back milrinone 0.125 mcg/kg/min. CO-OX stable.  -CVP is low. Stop lasix drip.   - Place Unna boots.  2. AKI: On CKD stage 3, baseline creatinine 1.5.  Creatinine up to 3 in setting of cardiogenic shock.  He would be a difficult dialysis candidate with severe biventricular failure.  - Creatinine unchanged  at 3  - As above volume status improved.  3. SVT: 3/31 had short run SVT with rate 150 yesterday.  He has frequent PACs. Continue amiodarone gtt 30 mg/hr while he is on milrinone.  4. Elevated LFTs: Suspect shock liver.   - Follow LFT trend. Pending.  - Hold statin for now with marked LFT elevation, eventually restart.  5. ID: Suspect primarily cardiogenic shock but covering broadly for septic shock component.  1 bottle positive for S epidermidis, suspect most likely contaminant.  Afebrile, WBCs trending down.  - On vancomycin/Zosyn.  6. CAD: Late-presented anterior MI in 12/09, patient had CABG in 1/10 with LIMA-LAD, SVG-OM1, sequential SVG-OM2 and dLCx, SVG-PDA. No coronary evaluation since that time.  No chest pain.  Mild troponin elevation with no trend this admission, suspect demand ischemia from shock/volume overload.  - No plan for coronary angiography, doubt ACS and AKI present.  - Statin on hold with elevated LFTs.  - On anticoagulation chronically so not on ASA.  7. 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  - Continue  heparin gtt.  8. Bronchiectasis with history of MAI: Chronic,  suspect this is not his main issue currently.  9. Acute hypoxemic respiratory failure: Suspect due to pulmonary edema. - Remains on Bipap.  - Volume status improved.   10. 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  11. GOC. He is DNR/DNI Would benefit from Palliative Care.   Length of Stay: 2  Tonye Becket, NP  06/09/2023, 7:10 AM  Advanced Heart Failure Team Pager [phone] (M-F; 7a - 5p)  Please contact [facility] for  night-coverage after hours (5p -7a ) and weekends on [email or web address]  Patient seen with NP, I formulated the plan and agree with the above note.   Patient is still on Bipap, but oxygen saturation is good.  CVP now down to 3, has been on Lasix gtt 10 mg/hr.  Weight down 1 lb. SBP running in 90s.  He is on milrinone 0.25 with co-ox 73%.  Creatinine 3 => 2.97. Lactate 1.7.   Patient is not conversant this morning.   General: NAD Neck: No JVD, no thyromegaly or thyroid nodule.  Lungs: Clear to auscultation bilaterally with normal respiratory effort. CV: Nondisplaced PMI.  Heart regular S1/S2, no S3/S4, no murmur.  1+ ankle edema.   Abdomen: Soft, nontender, no hepatosplenomegaly, no distention.  Skin: Intact without lesions or rashes.  Neurologic: Alert and oriented x 3.  Psych: Normal affect. Extremities: No clubbing or cyanosis.  HEENT: Normal.   Patient has cleared lactate and has good co-ox this morning on milrinone 0.25.  CVP down to 3.  Creatinine stable.  - Would stop Lasix for now.  - Decrease milrinone to 0.125 - Continue amiodarone to maintain NSR while on milrinone.  - Remains on heparin gtt for anticoagulation with history of PE.   With low CVP, would try taking him off Bipap this morning to nasal cannula.   Patient is critically ill with multisystem organ failure. He has severe biventricular failure, not a candidate for advanced cardiac therapies with age, AKI, and frailty. His prognosis is guarded.   CRITICAL CARE Performed by: Marca Ancona  Total critical care time: 35 minutes  Critical care time was exclusive of separately billable procedures and treating other patients.  Critical care was necessary to treat or prevent imminent or life-threatening deterioration.  Critical care was time spent personally by me on the following activities: development of treatment plan with patient and/or surrogate as well as nursing, discussions with consultants, evaluation of patient's  response to treatment, examination of patient, obtaining history from patient or surrogate, ordering and performing treatments and interventions, ordering and review of laboratory studies, ordering and review of radiographic studies, pulse oximetry and re-evaluation of patient's condition.  Marca Ancona 06/09/2023 10:31 AM

## 2023-06-09 NOTE — Progress Notes (Signed)
   06/09/23 0935  Spiritual Encounters  Type of Visit Initial  Care provided to: Patient  Conversation partners present during encounter Nurse  Referral source Nurse (RN/NT/LPN)  Reason for visit Routine spiritual support  OnCall Visit Yes  Spiritual Framework  Presenting Themes Significant life change (Patient talked about life and death. He knows that life is changing and he is okay with it)  Interventions  Spiritual Care Interventions Made Established relationship of care and support;Compassionate presence;Reflective listening  Intervention Outcomes  Outcomes Connection to spiritual care;Awareness around self/spiritual resourses   Patient seemed to need a compassionate presence in the room. Stated he felt lonely as his wife was not there. Chaplain stayed and listened to patient until patient said he felt he needed to rest. Chaplain stated she would check back on him when he had time to rest and the patient stated that would be nice.

## 2023-06-09 NOTE — Progress Notes (Signed)
 NAME:  Nicholas Alvarez, MRN:  [id number], DOB:  April 09, 1940, LOS: 2 ADMISSION DATE:  06/07/2023,  CHIEF COMPLAINT:  Cardiogenic Shock   History of Present Illness:   Patient is an 83 year old male presenting to the hospital with increased shortness of breath and altered mental status.  He was altered at the time of our interview and most of the history was obtained from his wife.   Patient's wife reports symptoms of increased fatigue and shortness of breath for the past couple of weeks. He has been unable to fall asleep laying flat, and has been sleeping in a recliner. He's also reported decreased appetite over the past week which is unusual for him. Wife also reports significant lower extremity edema which is similarly unusual for him. No report of chest pain or arm numbness recently, and no similar symptoms in the past (no signs of heart failure). Patient's wife does report he's been eating more frozen dinners recently. They did have a visiting nurse see him and he was noted to be significantly hypotensive, prompting presentation to the ED for an evaluation.   In the ED, he was hypoxic and hypotensive. Blood work showed an elevated troponin, and significantly elevated AST/ALT. Lactic acid was initially elevated at 5.0. Bedside POCUS by EDP showed a dilated IVC, and diffuse b-lines in all lung fields. He received IV antibiotics as well as IV furosemide and was started on BiPAP for respiratory support with improvement in BP and hemodynamics. Repeat lactic acid improved to 4.4. PCCM is consulted to admit.    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 ILD  Significant Hospital Events: Including procedures, antibiotic start and stop dates in addition to other pertinent events   06/07/2023: presented to the ED, on BiPAP, admit to ICU. 40 of IV furosemide administered. 06/08/2023: continues on BiPAP, TTE performed, on  furosemide gtt 06/09/2023: off furosemide, on nasal cannula, improved SOB.   Interim History / Subjective:  Continues to feel short of breath, but improved. On BiPAP. No chest pain.  Objective   Blood pressure (!) 101/55, pulse 66, temperature 98.4 F (36.9 C), temperature source Axillary, resp. rate 17, height 5\' 10"  (1.778 m), weight 70.3 kg, SpO2 98%. CVP:  [1 mmHg-15 mmHg] 3 mmHg  FiO2 (%):  [30 %-40 %] 30 %   Intake/Output Summary (Last 24 hours) at 06/09/2023 1313 Last data filed at 06/09/2023 0800 Gross per 24 hour  Intake 610.95 ml  Output 1350 ml  Net -739.05 ml   Filed Weights   06/07/23 1536 06/08/23 0500 06/09/23 0449  Weight: 71.2 kg 70.5 kg 70.3 kg    Examination: Physical Exam Constitutional:      Appearance: He is ill-appearing.  Cardiovascular:     Rate and Rhythm: Normal rate and regular rhythm.     Pulses: Normal pulses.     Heart sounds: Normal heart sounds.  Pulmonary:     Breath sounds: Rales present. No wheezing.  Musculoskeletal:     Right lower leg: Edema present.     Left lower leg: Edema present.  Neurological:     General: No focal deficit present.     Mental Status: He is alert. Mental status is at baseline.      Assessment & Plan:   #Acute Hypoxic Respiratory Failure #Acute Decompensated Heart Failure with Reduced Ejection Fraction #Cardiogenic Shock #AKI #Elevated Transaminases #CAD s/p CABG #Chronic MAI infection #Bronchiectasis without flare   Neuro: Patient  encephalopathic in the setting of decompensated heart failure and possibly superimposed infection. Unclear if underlying dementia but unlikely at this point. No CO2 narcosis on blood gas. Avoid psychotropic medications as able.  CV: Clinically in decompensated heart failure, TTE with severely depressed LVEF and dilated and dysfunctional RV. Lactic acid initially elevated, improved with diuresis. Overall picture consistent with cardiogenic shock, advanced heart failure  consulted, and he is maintained on milrinone gtt, decreased today. Furosemide gtt discontinued given improvement in CVP. Our ability to diurese is complicated by kidney function.             -continue milrinone gtt             -hold furosemide gtt             -monitor co-ox  Pulm: Respiratory failure secondary to pulmonary edema with bilateral pleural effusions on EDP POCUS. He's had bronchiectasis secondary to MAI, with concern for ILD in the past as well. I have personally reviewed his chest CT's and note the bronchiectasis and findings that are consistent with atypical mycobacterial infections. I don't suspect this process to be the driver behind his current presentation. 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 Other groups). Improved with diuresis and support of cardiac function. He is now off BIPAP and back on Nasal Cannula.  GI: elevated AST and ALT in the setting of statin use as well as decompensated heart failure. Holding statins and hepatotoxins.  Renal: AKI in the setting of decompensated heart failure and decreased PO intake. His kidney function is normal at baseline, and pre-renal azotemia, cardio-renal syndrome, and drug induced nephrotoxicity are all on the differential. Renal ultrasound unremarkable. Nephrology consulted and recs appreciated. He is not a good candidate for dialysis (nor would he want it)  Endo: ICU glycemic protocol  Hem/Onc: On apixaban for treatment of previous VTE, switched to heparin.  ID: history of MAI, treated with ethambutol and azithromycin (rifampin held due to hepatotoxicity). This presentation is not consistent with a pneumonia or an overt infection. Received broad spectrum antibiotics in the ED, will hold off on further antibiotics. Blood culture with staph epi in one bottle,  most likely a contaminant.  Best Practice (right click and "Reselect all SmartList Selections" daily)   Diet/type: NPO DVT prophylaxis systemic heparin Pressure ulcer(s): N/A GI prophylaxis: N/A Lines: Central line and yes and it is still needed Foley:  N/A Code Status:  DNR Last date of multidisciplinary goals of care discussion [06/09/2023]  Labs   CBC: Recent Labs  Lab 06/07/23 1542 06/08/23 0413 06/09/23 0443  WBC 11.5* 11.6* 10.3  NEUTROABS 9.4* 9.6*  --   HGB 10.4* 10.0* 9.1*  HCT 34.2* 31.5* 30.3*  MCV 87.5 84.0 88.3  PLT 304 258 166    Basic Metabolic Panel: Recent Labs  Lab 06/07/23 1542 06/07/23 2139 06/08/23 0214 06/08/23 0413 06/08/23 0549 06/08/23 1004 06/09/23 0443  NA 140 136  --  140 141  --  138  K 5.1 5.0 4.9 4.9 4.8 4.8 3.7  CL 100 101  --  102 102  --  98  CO2 23 19*  --  22 23  --  27  GLUCOSE 43* 95  --  95 94  --  127*  BUN 70* 71*  --  76* 76*  --  82*  CREATININE 2.86* 2.89*  --  2.99* 3.00*  --  2.97*  CALCIUM 9.1 8.7*  --  8.7* 8.4*  --  8.0*  MG  --  2.3  --  2.6*  --   --  2.3  PHOS  --  6.8*  --   --  6.9*  --  6.2*   GFR: Estimated Creatinine Clearance: 19.7 mL/min (A) (by C-G formula based on SCr of 2.97 mg/dL (H)). Recent Labs  Lab 06/07/23 1542 06/07/23 1730 06/07/23 2333 06/08/23 0214 06/08/23 0413 06/08/23 0549 06/09/23 0443  WBC 11.5*  --   --   --  11.6*  --  10.3  LATICACIDVEN 5.0*   < > 3.9* 3.5*  --  2.9* 1.7   < > = values in this interval not displayed.    Liver Function Tests: Recent Labs  Lab 06/07/23 1542 06/07/23 2139 06/08/23 0413 06/08/23 0549 06/09/23 0443  AST 1,428* 1,298* 1,424*  --   --   ALT 1,139* 1,092* 1,235*  --   --   ALKPHOS 71 66 66  --   --   BILITOT 2.0* 1.7* 1.9*  --   --   PROT 7.0 6.2* 6.6  --   --   ALBUMIN 3.3* 3.0* 3.3* 3.1* 2.8*   No results for input(s): "LIPASE", "AMYLASE" in the last 168 hours. No results for input(s): "AMMONIA" in the last 168 hours.  ABG     Component Value Date/Time   PHART 7.35 06/07/2023 2212   PCO2ART 34 06/07/2023 2212   PO2ART 168 (H) 06/07/2023 2212   HCO3 18.8 (L) 06/07/2023 2212   TCO2 21 03/12/2008 1953   ACIDBASEDEF 6.0 (H) 06/07/2023 2212   O2SAT 72.9 06/09/2023 0505     Coagulation Profile: Recent Labs  Lab 06/07/23 1542  INR 2.4*    Cardiac Enzymes: No results for input(s): "CKTOTAL", "CKMB", "CKMBINDEX", "TROPONINI" in the last 168 hours.  HbA1C: Hgb A1c MFr Bld  Date/Time Value Ref Range Status  03/07/2008 08:00 PM  4.6 - 6.1 % Final   5.9 (NOTE)   The ADA recommends the following therapeutic goal for glycemic   control related to Hgb A1C measurement:   Goal of Therapy:   < 7.0% Hgb A1C   Reference: American Diabetes Association: Clinical Practice   Recommendations 2008, Diabetes Care,  2008, 31:(Suppl 1).    CBG: Recent Labs  Lab 06/08/23 1934 06/08/23 2327 06/09/23 0421 06/09/23 0745 06/09/23 1231  GLUCAP 156* 136* 124* 123* 101*    Review of Systems:   N/A  Past Medical History:  He,  has a past medical history of Acute hypoxemic respiratory failure (HCC), BPH (benign prostatic hyperplasia), Bronchiectasis (HCC), CAD (coronary artery disease), Chronic anemia, COVID-19, Depression, Dyslipidemia, Dyspnea, ED (erectile dysfunction), Elevated PSA, Falls, Gross hematuria, History of methicillin resistant staphylococcus aureus (MRSA) (02/2022), hypotension, Hypertension, Interstitial lung disease (HCC), Ischemic cardiomyopathy, Left ventricular aneurysm, 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   Surgical History:   Past Surgical History:  Procedure Laterality Date   BRONCHIAL WASHINGS N/A 01/16/2022   Procedure: BRONCHIAL WASHINGS;  Surgeon: Vida Rigger, MD;  Location: [facility] ORS;   Service: Thoracic;  Laterality: N/A;   CARDIAC CATHETERIZATION  03/2008   COLONOSCOPY     CORONARY ARTERY BYPASS GRAFT  03/12/2008   HOLEP-LASER ENUCLEATION OF THE PROSTATE WITH MORCELLATION N/A 10/12/2022   Procedure: HOLEP-LASER ENUCLEATION OF THE PROSTATE  WITH MORCELLATION;  Surgeon: Vanna Scotland, MD;  Location: [facility] ORS;  Service: Urology;  Laterality: N/A;   PROSTATE BIOPSY  11/2009   RIGID BRONCHOSCOPY N/A 01/16/2022   Procedure: RIGID BRONCHOSCOPY;  Surgeon: Vida Rigger, MD;  Location: [facility] ORS;  Service: Thoracic;  Laterality: N/A;     Social History:   reports that he has quit smoking. His smoking use included cigarettes. He has been exposed to tobacco smoke. He has never used smokeless tobacco. He reports that he does not currently use alcohol after a past usage of about 12.0 standard drinks of alcohol per week. He reports that he does not use drugs.   Family History:  His family history includes COPD in his mother; Cancer in his sister; Coronary artery disease in an other family member; Heart attack (age of onset: 31) in his maternal grandfather and paternal grandfather; Heart attack (age of onset: 58) in his sister; Pneumonia in his father.   Allergies Allergies  Allergen Reactions   Ace Inhibitors Cough     Home Medications  Prior to Admission medications   Medication Sig Start Date End Date Taking? Authorizing Provider  albuterol (ACCUNEB) 1.25 MG/3ML nebulizer solution Take 1 ampule by nebulization daily. Use mixed with Pulmicort 0.25mg  daily. 05/29/23  Yes [provider]  albuterol (VENTOLIN HFA) 108 (90 Base) MCG/ACT inhaler Inhale 2 puffs into the lungs every 4 (four) hours as needed for shortness of breath. 05/03/23  Yes [provider]  apixaban (ELIQUIS) 5 MG TABS tablet Take 2 tablets (10 mg total) by mouth 2 (two) times daily for 6 days, THEN 1 tablet (5 mg total) 2 (two) times daily. 11/03/22 06/07/23 Yes Alexander, Dorene Grebe, DO  Apoaequorin  (PREVAGEN PO) Take 1 capsule by mouth daily.   Yes [provider]  azithromycin (ZITHROMAX) 500 MG tablet Take 500 mg by mouth as directed. Take 1 tablet 3 times a week on Monday, Wednesday and Friday. 05/12/23 05/11/24 Yes [provider]  budesonide (PULMICORT) 0.25 MG/2ML nebulizer solution Inhale 0.25 mg into the lungs daily. 03/18/23  Yes [provider]  diltiazem (CARDIZEM CD) 180 MG 24 hr capsule Take 180 mg by mouth daily. 03/31/23  Yes [provider]  DULoxetine (CYMBALTA) 60 MG capsule Take 60 mg by mouth daily. 11/12/22 11/12/23 Yes [provider]  ethambutol (MYAMBUTOL) 400 MG tablet Take 1,800 mg by mouth 3 (three) times a week. M, W, F-takes 4.5 tablets   Yes [provider]  feeding supplement (ENSURE ENLIVE / ENSURE PLUS) LIQD Take 237 mLs by mouth 3 (three) times daily between meals. 11/03/22  Yes Sunnie Nielsen, DO  finasteride (PROSCAR) 5 MG tablet Take 5 mg by mouth every morning.   Yes [provider]  mirtazapine (REMERON) 15 MG tablet Take 1 tablet by mouth at bedtime. 07/16/22 07/16/23 Yes [provider]  Multiple Vitamin (MULTIVITAMIN WITH MINERALS) TABS tablet Take 1 tablet by mouth daily. 11/04/22  Yes Sunnie Nielsen, DO  rifampin (RIFADIN) 300 MG capsule Take 600 mg by mouth 3 (three) times a week. M, W, F   Yes [provider]  simvastatin (ZOCOR) 40 MG tablet Take 40 mg by mouth every morning.   Yes [provider]  Spacer/Aero-Holding Chambers (EASIVENT) inhaler 1 each by Other route See admin instructions. 05/03/23 05/02/24 Yes [provider]  torsemide (DEMADEX) 20 MG tablet Take 20 mg by mouth daily. 05/03/23  Yes [provider]     Critical care time: 43 minutes  Raechel Chute, MD [facility] 06/09/2023 1:17 PM

## 2023-06-09 NOTE — Progress Notes (Signed)
 PHARMACY - ANTICOAGULATION CONSULT NOTE  Pharmacy Consult for Heparin Infusion- per MD, no bolus Indication: DVT  Allergies  Allergen Reactions   Ace Inhibitors Cough    Patient Measurements: Height: 5\' 10"  (177.8 cm) Weight: 70.5 kg (155 lb 6.8 oz) IBW/kg (Calculated) : 73 HEPARIN DW (KG): 71.2  Vital Signs: Temp: 97.6 F (36.4 C) (04/02 0000) Temp Source: Axillary (04/02 0000) BP: 105/59 (04/02 0105) Pulse Rate: 60 (04/02 0105)  Labs: Recent Labs    06/07/23 1542 06/07/23 1542 06/07/23 1730 06/07/23 1953 06/07/23 2139 06/08/23 0214 06/08/23 0413 06/08/23 0549 06/08/23 1400 06/08/23 2359  HGB 10.4*  --   --   --   --   --  10.0*  --   --   --   HCT 34.2*  --   --   --   --   --  31.5*  --   --   --   PLT 304  --   --   --   --   --  258  --   --   --   APTT  --    < >  --  35  --   --  45*  --  37* 63*  LABPROT 26.5*  --   --   --   --   --   --   --   --   --   INR 2.4*  --   --   --   --   --   --   --   --   --   HEPARINUNFRC  --   --   --  0.46  --   --  0.44  --   --   --   CREATININE 2.86*  --   --   --  2.89*  --  2.99* 3.00*  --   --   TROPONINIHS 395*  --  377*  --  390* 404*  --   --   --   --    < > = values in this interval not displayed.    Estimated Creatinine Clearance: 19.6 mL/min (A) (by C-G formula based on SCr of 3 mg/dL (H)).   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    Chronic anemia    COVID-19    Depression    Dyslipidemia    Dyspnea    ED (erectile dysfunction)    Elevated PSA    Falls    Gross hematuria    History of methicillin resistant staphylococcus aureus (MRSA) 02/2022   + PCR nasal swab   Hx of hypotension    was placed on midodrine and then taken off   Hypertension    Interstitial lung  disease (HCC)    Ischemic cardiomyopathy    with left ventricular ejection fraction of 35%   Left ventricular aneurysm    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 Last dose 3/30 @  1300.  Assessment: Patient is a 83 year old male with a past medical history of CHF with reduced ejection fraction, chronic MAI infection, bronchiectasis, ILD, CAD status post CABG, PE on Eliquis, presenting with shortness of breath and leg swelling. Pharmacy has been consulted to initiate patient on a heparin infusion for VTE treatment.  Baseline heparin level and aPTT ordered. Baseline INR elevated at 2.4.  No signs/symptoms of bleeding noted in chart. Hgb 10.4 (above baseline). PLT 304.  Goal of Therapy:  Heparin level 0.3-0.7 units/ml aPTT 66-102 seconds Monitor platelets by anticoagulation protocol: Yes  04/01 0413 aPTT 45, subtherapeutic / HL 0.44, not correlating 04/01 2359 aPTT 63, subtherapeutic   Plan:  No bolus per MD Increase heparin infusion to a rate of 1450 units/hr Will monitor via aPTT levels until heparin level correlates, then can transition to heparin level monitoring only Recheck aPTT in 8 hours after rate change, heparin levels daily until correlating with aPTT Monitor CBC daily while on heparin   Otelia Sergeant, PharmD, [facility] 06/09/2023 1:21 AM

## 2023-06-09 NOTE — Plan of Care (Signed)
   Problem: Clinical Measurements: Goal: Ability to maintain clinical measurements within normal limits will improve Outcome: Progressing Goal: Will remain free from infection Outcome: Progressing Goal: Diagnostic test results will improve Outcome: Progressing Goal: Respiratory complications will improve Outcome: Progressing

## 2023-06-10 ENCOUNTER — Ambulatory Visit

## 2023-06-10 ENCOUNTER — Inpatient Hospital Stay

## 2023-06-10 DIAGNOSIS — Z711 Person with feared health complaint in whom no diagnosis is made: Secondary | ICD-10-CM

## 2023-06-10 DIAGNOSIS — Z515 Encounter for palliative care: Secondary | ICD-10-CM | POA: Diagnosis not present

## 2023-06-10 DIAGNOSIS — Z7189 Other specified counseling: Secondary | ICD-10-CM | POA: Diagnosis not present

## 2023-06-10 DIAGNOSIS — J9601 Acute respiratory failure with hypoxia: Secondary | ICD-10-CM | POA: Diagnosis not present

## 2023-06-10 DIAGNOSIS — I509 Heart failure, unspecified: Secondary | ICD-10-CM | POA: Diagnosis not present

## 2023-06-10 LAB — COOXEMETRY PANEL
Carboxyhemoglobin: 2 % — ABNORMAL HIGH (ref 0.5–1.5)
Methemoglobin: 0.7 % (ref 0.0–1.5)
O2 Saturation: 77.3 %
Total hemoglobin: 9.3 g/dL — ABNORMAL LOW (ref 12.0–16.0)
Total oxygen content: 75.2 %

## 2023-06-10 LAB — HEPARIN LEVEL (UNFRACTIONATED): Heparin Unfractionated: 0.36 [IU]/mL (ref 0.30–0.70)

## 2023-06-10 LAB — CULTURE, BLOOD (ROUTINE X 2)

## 2023-06-10 LAB — COMPREHENSIVE METABOLIC PANEL WITH GFR
ALT: 682 U/L — ABNORMAL HIGH (ref 0–44)
AST: 313 U/L — ABNORMAL HIGH (ref 15–41)
Albumin: 2.8 g/dL — ABNORMAL LOW (ref 3.5–5.0)
Alkaline Phosphatase: 60 U/L (ref 38–126)
Anion gap: 12 (ref 5–15)
BUN: 88 mg/dL — ABNORMAL HIGH (ref 8–23)
CO2: 29 mmol/L (ref 22–32)
Calcium: 8.4 mg/dL — ABNORMAL LOW (ref 8.9–10.3)
Chloride: 100 mmol/L (ref 98–111)
Creatinine, Ser: 3.17 mg/dL — ABNORMAL HIGH (ref 0.61–1.24)
GFR, Estimated: 19 mL/min — ABNORMAL LOW (ref >60)
Glucose, Bld: 122 mg/dL — ABNORMAL HIGH (ref 70–99)
Potassium: 3.7 mmol/L (ref 3.5–5.1)
Sodium: 141 mmol/L (ref 135–145)
Total Bilirubin: 1.5 mg/dL — ABNORMAL HIGH (ref 0.0–1.2)
Total Protein: 5.6 g/dL — ABNORMAL LOW (ref 6.5–8.1)

## 2023-06-10 LAB — BLOOD GAS, VENOUS
Acid-Base Excess: 2.8 mmol/L — ABNORMAL HIGH (ref 0.0–2.0)
Bicarbonate: 24.2 mmol/L — ABNORMAL HIGH (ref 20.0–28.0)
Bicarbonate: 31.5 mmol/L — ABNORMAL HIGH (ref 20.0–28.0)
Delivery systems: POSITIVE
O2 Saturation: 41.7 mmol/L — ABNORMAL HIGH (ref 0.0–2.0)
O2 Saturation: 71.6 %
Patient temperature: 37
Patient temperature: 41.7 %
Patient temperature: 37
pCO2, Ven: 47 mmHg (ref 44–60)
pCO2, Ven: 67 mmHg — ABNORMAL HIGH (ref 44–60)
pH, Ven: 7.32 (ref 7.25–7.43)
pH, Ven: 7.28 (ref 7.25–7.43)
pO2, Ven: <31 mmol/L — CL (ref 32–45)
pO2, Ven: 42 mmHg (ref 32–45)

## 2023-06-10 LAB — CBC
HCT: 29.3 % — ABNORMAL LOW (ref 39.0–52.0)
Hemoglobin: 8.8 g/dL — ABNORMAL LOW (ref 13.0–17.0)
MCH: 26.4 pg (ref 26.0–34.0)
MCHC: 30 g/dL (ref 30.0–36.0)
MCV: 88 fL (ref 80.0–100.0)
Platelets: 135 10*3/uL — ABNORMAL LOW (ref 150–400)
RBC: 3.33 MIL/uL — ABNORMAL LOW (ref 4.22–5.81)
RDW: 18.1 % — ABNORMAL HIGH (ref 11.5–15.5)
WBC: 11.9 10*3/uL — ABNORMAL HIGH (ref 4.0–10.5)
nRBC: 0.2 % (ref 0.0–0.2)

## 2023-06-10 LAB — GLUCOSE, CAPILLARY
Glucose-Capillary: 97 mg/dL (ref 70–99)
Glucose-Capillary: 104 mg/dL — ABNORMAL HIGH (ref 70–99)

## 2023-06-10 LAB — APTT: aPTT: 97 s — ABNORMAL HIGH (ref 24–36)

## 2023-06-10 LAB — PHOSPHORUS: Phosphorus: 6.5 mg/dL — ABNORMAL HIGH (ref 2.5–4.6)

## 2023-06-10 LAB — MAGNESIUM: Magnesium: 2.4 mg/dL (ref 1.7–2.4)

## 2023-06-10 MED ORDER — ACETAMINOPHEN 650 MG RE SUPP: 650.0000 mg | Freq: Four times a day (QID) | RECTAL | Status: DC | PRN

## 2023-06-10 MED ORDER — THIAMINE HCL 100 MG PO TABS: 100.0000 mg | ORAL_TABLET | Freq: Every day | ORAL | Status: DC

## 2023-06-10 MED ORDER — LORAZEPAM 2 MG/ML IJ SOLN: 2.0000 mg | INTRAMUSCULAR | Status: DC | PRN

## 2023-06-10 MED ORDER — POLYVINYL ALCOHOL 1.4 % OP SOLN: 1.0000 [drp] | Freq: Four times a day (QID) | OPHTHALMIC | Status: DC | PRN

## 2023-06-10 MED ORDER — NOREPINEPHRINE 4 MG/250ML-% IV SOLN: 0.0000 ug/min | INTRAVENOUS | Status: DC

## 2023-06-10 MED ORDER — ENSURE ENLIVE PO LIQD: 237.0000 mL | Freq: Three times a day (TID) | ORAL | Status: DC

## 2023-06-10 MED ORDER — SODIUM CHLORIDE 0.9 % IV SOLN: 250.0000 mL | INTRAVENOUS | Status: DC

## 2023-06-10 MED ORDER — GLYCOPYRROLATE 0.2 MG/ML IJ SOLN: 0.2000 mg | INTRAMUSCULAR | Status: DC | PRN

## 2023-06-10 MED ORDER — ADULT MULTIVITAMIN W/MINERALS CH: 1.0000 | ORAL_TABLET | Freq: Every day | ORAL | Status: DC

## 2023-06-10 MED ORDER — GLYCOPYRROLATE 1 MG PO TABS: 1.0000 mg | ORAL_TABLET | ORAL | Status: DC | PRN

## 2023-06-10 MED ORDER — MORPHINE 100MG IN NS 100ML (1MG/ML) PREMIX INFUSION: 2.0000 mg/h | INTRAVENOUS | Status: DC | PRN

## 2023-06-10 MED ORDER — NOREPINEPHRINE 4 MG/250ML-% IV SOLN
2.0000 ug/min | INTRAVENOUS | Status: DC
  Administered 2023-06-10: 5 ug/min via INTRAVENOUS
  Filled 2023-06-10: qty 250

## 2023-06-10 MED ORDER — ACETAMINOPHEN 325 MG PO TABS: 650.0000 mg | ORAL_TABLET | Freq: Four times a day (QID) | ORAL | Status: DC | PRN

## 2023-06-11 ENCOUNTER — Ambulatory Visit: Payer: Medicare Other | Admitting: Cardiology

## 2023-06-11 LAB — VITAMIN B1: Vitamin B1 (Thiamine): 152.3 nmol/L (ref 66.5–200.0)

## 2023-06-12 LAB — CULTURE, BLOOD (ROUTINE X 2): Culture: NO GROWTH

## 2023-07-08 NOTE — Hospital Course (Addendum)
 Hospital course / significant events:   HPI: Patient is an 83 year old male Hx chronic MAI infection (on ethambutol, rifamipin and azithromycin chronically and followed by pulmonary), CAD s/p CABG, bronchiectasis, Sjogren's, 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 (+)orthopnea, LE edema. Hypotension per visiting nurse, so brought to ED.   03/31: to ED. Hypoxic, hypotensive, elevated troponin, and significantly elevated AST/ALT. Lactic acid 5.0. Bedside POCUS by EDP showed a dilated IVC, and diffuse b-lines in all lung fields. Tx IV antibiotics, IV furosemide, BiPAP, admitted to ICU w/ decompensated CHF, AKI, resp fail, AMS. Later evening started on amiodarone gtt for SVT, lasix gtt. Placed on heparin gtt and holding Eliquis. 04/01: Cardiology consult - Echo EF < 20% with no LV thrombus, mild LV dilation and mild LVH, moderate RV enlargement with severe RV dysfunction, mild-moderate MR. Started on milrinone gtt. Remains on lasix gtt, heparin gtt, amiodarone gtt. Pt DNR/DNI see IPAL note. Nephrology consult - not good candidate for dialysis given CHF. Continuing on BiPap 04/02: d/c lasix gtt, continuing amiodarone gtt while on milrinone gtt, volume status improving. Palliative care consult. Weaned to nasal cannula O2. Abx d/c.  04/03: holding lasix, continue milrinone and amio. BP continuing to drop and pt is mostly somnolent. Decision made to transition to comfort measures later this morning.      Consultants:  ICU (initial admission) Cardiology / Advanced Heart Failure Team Nephrology  Palliative care    Procedures/Surgeries: 04/01: L subclavian central line placement       ASSESSMENT & PLAN:   Comfort measures  Discontinuing treatment-oriented orders including labs, diagnostics Focus on comfort care and symptoms management  Wife is decision-maker, I confirmed she knows what to expect and how the  medications work, all questions were answered  O2 as needed for comfort / can discontinue supplementation Morphine as needed for air hunger symptoms    PRIOR TO COMFORT MEASURES - see hospital course for details of treatment:   #Acute on chronic systolic CHF, NYHA class IV, severe biventricular failure, EF < 20% #Hypotension d/t cardiogenic shock d/t HFrEF #CAD: Late-presented anterior MI in 12/09, patient had CABG in 1/10  #Mild troponin elevation with no trend this admission, suspect demand ischemia from shock/volume overload, have reasonably excluded ACS  #Acute hypoxemic respiratory failure: Suspect due to pulmonary edema. 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  #Elevated LFTs: Suspect shock liver d/t cardiogenic shock as above in setting HFrEF.   #Hypotension likely cardiogenic shock but covering broadly for septic shock component.   #1 BCx bottle (+) S epidermidis, suspect most likely contaminant.  Afebrile, WBCs trending down.  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  #Bronchiectasis with history of MAI: Chronic, suspect this is not his main issue currently.  #Sjogren's syndrome   #GOC.  He is DNR/DNI Palliative care following

## 2023-07-08 NOTE — Progress Notes (Signed)
                                                     Palliative Care Progress Note, Assessment & Plan   Patient Name: Nicholas Alvarez       Date:  DOB: 23-Aug-1940  Age: 83 y.o. MRN#: [id number] Attending Physician: Sunnie Nielsen, DO Primary Care Physician: Kandyce Rud, MD Admit Date: 06/07/2023  Subjective: Unresponsive  HPI: 83 y.o. male  with past medical history of CHF with reduced ejection fraction, chronic MAI infection, bronchiectasis, ILD, CAD status post CABG, PE on Eliquis. He presented to ED via EMS c/o SOB and cough with leg swelling. On scene he was 80s on RA.    In the ED, he was found to be hypoxic and hypotensive. Workup showed elevated troponin 395, significantly elevated LFTs, lactic 5.0, creat 2.86 (0.81 baseline). CXR showed "stable findings of chronic bronchiectasis and bilateral scarring, previously characterized as sequela of chronic indolent infection such as an ARI. Trace bilateral pleural effusions."    Due to respiratory failure, he was placed on BiPAP and subsequently admitted to ICU.   PMT consulted for goals of care.   Summary of counseling/coordination of care: Extensive chart review completed prior to meeting patient including labs, vital signs, imaging, progress notes, orders, and available advanced directive documents from current and previous encounters.   Received message from primary RN that patient was unresponsive and hypotensive via Epic chat. RN advises that wife has decided to transition patient to comfort care measures, foregoing aggressive medical intervention.   After reviewing the patient's chart, I assessed pt at bedside. Ill-appearing male in bed with agonal breathing. He is in no distress. Wife and chaplain at bedside.  I spoke with wife in regards to symptom management. She understands if  there is a change in status, she can notify the RN and medications can be provided for signs of distress or discomfort.   Therapeutic silence and active listening provided for wife to share her thoughts and emotions regarding current medical situation. She states that she did not realize that her husbands death would be this fast. Emotional support provided.  Physical Exam Vitals reviewed.  Constitutional:      General: He is not in acute distress.    Appearance: He is ill-appearing.     Comments: Frail  Pulmonary:     Comments: Agonal  Neurological:     Comments: Unrepsonsive             Total Time 50 minutes   Time spent includes: Detailed review of medical records (labs, imaging, vital signs), medically appropriate exam (mental status, respiratory, cardiac, skin), discussed with treatment team, counseling and educating patient, family and staff, documenting clinical information, medication management and coordination of care.     Alex Gardener, [facility] Palliative Medicine Team   11:46 AM  Office [phone]  Pager [phone]

## 2023-07-08 NOTE — Progress Notes (Addendum)
    1200  Spiritual Encounters  Type of Visit Initial  Care provided to: Pt and family  Conversation partners present during encounter Nurse  Referral source Chaplain assessment  Reason for visit Routine spiritual support  OnCall Visit No  Spiritual Framework  Presenting Themes Meaning/purpose/sources of inspiration;Goals in life/care;Courage hope and growth;Impactful experiences and emotions  Community/Connection Family  Family Stress Factors Loss  Interventions  Spiritual Care Interventions Made Established relationship of care and support;Compassionate presence;Prayer;Encouragement   Chaplain received a page for an EOL. Chaplain met with patient's wife Nicholas Alvarez and prayed with her and the patient. Chaplain services is available for patient and family whenever they need.

## 2023-07-08 NOTE — Death Summary Note (Signed)
 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 (+)orthopnea, LE edema. Hypotension per visiting nurse, so brought to ED.   2023-06-12: to ED. Hypoxic, hypotensive, elevated troponin, and significantly elevated AST/ALT. Lactic acid 5.0. Bedside POCUS by EDP showed a dilated IVC, and diffuse b-lines in all lung fields. Tx IV antibiotics, IV furosemide, BiPAP, admitted to ICU w/ decompensated CHF, AKI, resp fail, AMS. Later evening started on amiodarone gtt for SVT, lasix gtt. Placed on heparin gtt and holding Eliquis. 04/01: Cardiology consult - Echo EF < 20% with no LV thrombus, mild LV dilation and mild LVH, moderate RV enlargement with severe RV dysfunction, mild-moderate MR. Started on milrinone gtt. Remains on lasix gtt, heparin gtt, amiodarone gtt. Pt DNR/DNI see IPAL note. Nephrology consult - not good candidate for dialysis given CHF. Continuing on BiPap 04/02: d/c lasix gtt, continuing amiodarone gtt while on milrinone gtt, volume status improving. Palliative care consult. Weaned to nasal cannula O2. Abx d/c.  15-Jun-2023: holding lasix, continue milrinone and amio. BP continuing to drop and pt is mostly somnolent. Decision made to transition to comfort measures later this morning. 12:20  pt pronounced deceased.      Consultants:  ICU (initial admission) Cardiology / Advanced Heart Failure Team Nephrology  Palliative care    Procedures/Surgeries: 04/01: L subclavian central line placement       ASSESSMENT & PLAN:   Comfort measures  Per protocol Patient passed peacefully  Time of death 12:20 2023-06-15   PRIOR TO COMFORT MEASURES - see hospital course for details of treatment:   #Acute on chronic systolic CHF, NYHA class IV, severe biventricular failure, EF < 20% #Hypotension d/t cardiogenic shock d/t HFrEF #CAD: Late-presented anterior MI in 12/09, patient had CABG in 1/10  #Mild troponin elevation with no trend this admission, suspect demand ischemia from shock/volume overload, have reasonably excluded ACS  #Acute hypoxemic respiratory failure: Suspect due to pulmonary edema. 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  #Elevated LFTs: Suspect shock liver d/t cardiogenic shock as above in setting HFrEF.   #Hypotension likely cardiogenic shock but covering broadly for septic shock component.   #1 BCx bottle (+) S epidermidis, suspect most likely contaminant.  Afebrile, WBCs trending down.  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  #Bronchiectasis with history of MAI: Chronic, suspect this is not his main issue currently.  #Sjogren's syndrome   #GOC.  He is DNR/DNI Palliative care following          Nutrition Documentation    Flowsheet Row ED to Hosp-Admission (Current) from 06-12-2023 in [facility] ICU/CCU  Nutrition Problem Severe Malnutrition  Etiology chronic illness  Nutrition Goal Patient will meet greater than or equal to 90% of their needs  The results of significant diagnostics from this hospitalization (including imaging, microbiology, ancillary and laboratory) are listed below for reference.   Significant Diagnostic Studies: DG  CHEST PORT 1 VIEW Result Date:  CLINICAL DATA:  CHF EXAM: PORTABLE CHEST 1 VIEW COMPARISON:  06/08/2023 x-ray FINDINGS: Sternal wires. Enlarged cardiopericardial silhouette. Stable left subclavian line. Persistent apical pleural thickening. Persistent small pleural effusions, right-greater-than-left. Coarse interstitial changes are again noted. Stable patchy lung opacities. Overlapping cardiac leads. IMPRESSION: No signal change when adjusted for technique. Electronically Signed   By: Karen Kays M.D.   On:  12:28   DG Chest 1 View Result Date: 06/08/2023 CLINICAL DATA:  Central line placement EXAM: CHEST  1 VIEW COMPARISON:  06/07/2023 FINDINGS: Left-sided central venous catheter extends to the proximal SVC. No pneumothorax. Coarse chronic interstitial opacities bilaterally with some patchy airspace disease in both upper lobes as before. Heart size and mediastinal contours are within normal limits. CABG markers. No effusion. Sternotomy wires. IMPRESSION: Left-sided central venous catheter to the proximal SVC. No pneumothorax. Electronically Signed   By: Corlis Leak M.D.   On: 06/08/2023 17:18   ECHOCARDIOGRAM COMPLETE Result Date: 06/08/2023    ECHOCARDIOGRAM REPORT   Patient Name:   Nicholas Alvarez [facility] Date of Exam: 06/08/2023 Medical Rec #:  [id number]      Height:       70.0 in Accession #:    [id number]     Weight:       155.4 lb Date of Birth:  23-Dec-1940      BSA:          1.875 m Patient Age:    82 years       BP:           111/59 mmHg Patient Gender: M              HR:           91 bpm. Exam Location:  [facility] Procedure: 2D Echo, 3D Echo, Cardiac Doppler, Color Doppler and Intracardiac            Opacification Agent (Both Spectral and Color Flow Doppler were            utilized during procedure). Indications:     Dyapnea  History:         Patient has prior history of Echocardiogram examinations, most                  recent 11/02/2022. CAD and Previous Myocardial Infarction, Prior                   CABG; Signs/Symptoms:Dyspnea. Pulmonary embolus.  Sonographer:     Mikki Harbor Referring Phys:  [id number] KHABIB DGAYLI Diagnosing Phys: Yvonne Kendall MD IMPRESSIONS  1. Left ventricular ejection fraction, by estimation, is <20%. Left ventricular ejection fraction by 3D volume is 12 %. The left ventricle has severely decreased function. The left ventricle demonstrates global hypokinesis. The left ventricular internal  cavity size was mildly dilated. There is moderate left ventricular hypertrophy. Left ventricular diastolic parameters are consistent with Grade II diastolic dysfunction (pseudonormalization). Elevated left atrial pressure.  2. Right ventricular systolic function is severely reduced. The right ventricular size is moderately enlarged. There is severely elevated pulmonary artery systolic pressure.  3. Left atrial size was mildly dilated.  4. Right atrial size was mildly dilated.  5. Large pleural effusion in the left lateral region.  6. The mitral valve is abnormal. Mild to moderate mitral valve regurgitation.  7. The aortic valve is tricuspid. There is mild calcification of the aortic valve. There is moderate thickening of the aortic valve. Aortic valve regurgitation is not visualized. Aortic valve sclerosis/calcification is present, without any evidence of aortic stenosis.  8. Mildly dilated pulmonary artery. Conclusion(s)/Recommendation(s): No left ventricular mural or apical thrombus/thrombi. FINDINGS  Left Ventricle: Left ventricular ejection fraction, by estimation, is <20%. Left ventricular ejection fraction by 3D volume is 12 %. The left ventricle has severely decreased function. The left ventricle demonstrates global hypokinesis. Definity contrast agent was given IV to delineate the left ventricular endocardial borders. The left ventricular internal cavity size was mildly dilated. There is moderate left ventricular hypertrophy. Left ventricular diastolic parameters are consistent with  Grade II diastolic dysfunction (pseudonormalization). Elevated left atrial pressure. Right Ventricle: The right ventricular size is moderately enlarged. No increase in right ventricular wall thickness. Right ventricular systolic function is severely reduced. There is severely elevated pulmonary artery systolic pressure. The tricuspid regurgitant velocity is 3.51 m/s, and with an assumed right atrial pressure of 15 mmHg, the estimated right ventricular systolic pressure is 64.3 mmHg. Left Atrium: Left atrial size was mildly dilated. Right Atrium: Right atrial size was mildly dilated. Pericardium: There is no evidence of pericardial effusion. Mitral Valve: The mitral valve is abnormal. There is mild thickening of the mitral valve leaflet(s). Mild to moderate mitral valve regurgitation. MV peak gradient, 4.3 mmHg. The mean mitral valve gradient is 1.0 mmHg. Tricuspid Valve: The tricuspid valve is normal in structure. Tricuspid valve regurgitation is mild. Aortic Valve: The aortic valve is tricuspid. There is mild calcification of the aortic valve. There is moderate thickening of the aortic valve. Aortic valve regurgitation is not visualized. Aortic valve sclerosis/calcification is present, without any evidence of aortic stenosis. Aortic valve mean gradient measures 2.0 mmHg. Aortic valve peak gradient measures 4.3 mmHg. Aortic valve area, by VTI measures 2.98 cm. Pulmonic Valve: The pulmonic valve was normal in structure. Pulmonic valve regurgitation is trivial. No evidence of pulmonic stenosis. Aorta: The aortic root is normal in size and structure. Pulmonary Artery: The pulmonary artery is mildly dilated. IAS/Shunts: The interatrial septum was not well visualized. Additional Comments: 3D was performed not requiring image post processing on an independent workstation and was abnormal. There is a large pleural effusion in the left lateral region.  LEFT VENTRICLE PLAX 2D LVIDd:         5.60 cm         Diastology LVIDs:          5.10 cm         LV e' medial:    5.03 cm/s LV PW:         1.30 cm         LV E/e' medial:  17.5 LV IVS:        1.20 cm         LV e' lateral:   7.58 cm/s LVOT diam:     2.00 cm         LV E/e' lateral: 11.6 LV SV:         52 LV SV Index:   27 LVOT Area:     3.14 cm        3D Volume EF                                LV 3D EF:    Left  ventricul                                             ar                                             ejection                                             fraction                                             by 3D                                             volume is                                             12 %.                                 3D Volume EF:                                3D EF:        12 % RIGHT VENTRICLE RV Basal diam:  4.90 cm LEFT ATRIUM             Index        RIGHT ATRIUM           Index LA diam:        5.20 cm 2.77 cm/m   RA Area:     21.30 cm LA Vol (A2C):   97.3 ml 51.89 ml/m  RA Volume:   75.30 ml  40.16 ml/m LA Vol (A4C):   79.7 ml 42.50 ml/m LA Biplane Vol: 88.7 ml 47.30 ml/m  AORTIC VALVE                    PULMONIC VALVE AV Area (Vmax):    3.09 cm     PV Vmax:       0.69 m/s AV Area (Vmean):   2.49 cm     PV Peak grad:  1.9 mmHg AV Area (VTI):     2.98 cm AV Vmax:           103.85 cm/s AV Vmean:          60.100 cm/s AV VTI:            0.173 m AV Peak Grad:      4.3 mmHg AV Mean Grad:      2.0 mmHg LVOT Vmax:         102.00 cm/s LVOT Vmean:  47.700 cm/s LVOT VTI:          0.164 m LVOT/AV VTI ratio: 0.95  AORTA Ao Root diam: 3.70 cm MITRAL VALVE               TRICUSPID VALVE MV Area (PHT): 7.02 cm    TR Peak grad:   49.3 mmHg MV Area VTI:   1.51 cm    TR Vmax:        351.00 cm/s MV Peak grad:  4.3 mmHg MV Mean grad:  1.0 mmHg    SHUNTS MV Vmax:       1.04 m/s    Systemic VTI:  0.16 m MV Vmean:      44.1 cm/s   Systemic Diam: 2.00 cm MV Decel Time: 108 msec MV E velocity: 88.00 cm/s MV A  velocity: 46.10 cm/s MV E/A ratio:  1.91 Cristal Deer End MD Electronically signed by Yvonne Kendall MD Signature Date/Time: 06/08/2023/10:11:45 AM    Final    US RENAL Result Date: 06/07/2023 CLINICAL DATA:  Acute renal insufficiency EXAM: RENAL / URINARY TRACT ULTRASOUND COMPLETE COMPARISON:  10/30/2022 FINDINGS: Right Kidney: Not visualized. Left Kidney: Renal measurements: 8.8 x 4.1 x 3.7 cm = volume: 69.1 mL. Echogenicity within normal limits. No mass or hydronephrosis visualized. Bladder: Not visualized. Other: Examination is extremely limited due to patient cooperation and refusal to complete the evaluation. Incidental note is made of a left pleural effusion. IMPRESSION: 1. Limited study due to patient cooperation and refusal to complete the examination. The right kidney and bladder are not visualized. 2. Unremarkable left kidney. Electronically Signed   By: Sharlet Salina M.D.   On: 06/07/2023 21:32   US Abdomen Limited RUQ (LIVER/GB) Result Date: 06/07/2023 CLINICAL DATA:  Transaminitis. EXAM: ULTRASOUND ABDOMEN LIMITED RIGHT UPPER QUADRANT COMPARISON:  None Available. FINDINGS: Gallbladder: No gallstones or wall thickening visualized (1.5 mm). No sonographic Murphy sign noted by sonographer. Common bile duct: Diameter: 2.0 mm Liver: No focal lesion identified. Within normal limits in parenchymal echogenicity. Portal vein is patent on color Doppler imaging with normal direction of blood flow towards the liver. Other: The study is technically limited secondary to inability of the patient to hold breath or lay flat during the exam, as per the ultrasound technologist. IMPRESSION: Unremarkable right upper quadrant ultrasound. Electronically Signed   By: Aram Candela M.D.   On: 06/07/2023 17:41   DG Chest Portable 1 View Result Date: 06/07/2023 CLINICAL DATA:  Short of breath, respiratory distress EXAM: PORTABLE CHEST 1 VIEW COMPARISON:  10/30/2022 FINDINGS: Single frontal view of the chest  demonstrates stable postsurgical changes from CABG. The cardiac silhouette is enlarged. Chronic areas of scarring and bronchiectasis are again seen throughout the lungs, with no new areas of consolidation identified. Trace bilateral pleural effusions. No pneumothorax. No acute bony abnormalities. IMPRESSION: 1. 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 2. Trace bilateral pleural effusions. 3. No acute airspace disease. Electronically Signed   By: Sharlet Salina M.D.   On: 06/07/2023 16:33   CT CHEST WO CONTRAST Result Date: 05/28/2023 CLINICAL DATA:  Shortness of breath, patient with ILD EXAM: CT CHEST WITHOUT CONTRAST TECHNIQUE: Multidetector CT imaging of the chest was performed following the standard protocol without IV contrast. RADIATION DOSE REDUCTION: This exam was performed according to the departmental dose-optimization program which includes automated exposure control, adjustment of the mA and/or kV according to patient size and/or use of iterative reconstruction  technique. COMPARISON:  CT chest December 23, 2022 FINDINGS: Cardiovascular: Extensive coronary artery calcifications. Prior CABG. Ascending aorta is normal. No pericardial effusions. Mediastinum/Nodes: Comparison with prior examination stable retrocaval pretracheal 16 mm lymph node. No other significant adenopathy. Lungs/Pleura: Comparison with prior examination of the bilateral small pleural effusions. Comparison with prior examinations demonstrates persistent significant fibro atelectatic changes and bronchiectatic changes bilateral with significant interval improvement in the large cavitary lesion of the left upper lobe enlarged cavitary or dilated bronchiectasis of the left upper lobe and right middle lobe. There is reticulonodular infiltrates with ill-defined nodular infiltrates of the peripheral portion of the right lower lobe.  Nodular density along the fissure in the right upper lobe measures 11 mm in image 48 Subpleural nodule in the lateral segment of the right lower lobe image 79 measures 1.2 cm Solid nodule in the right lower lobe image 86 measures 9 mm. Ill-defined nodular infiltrates in the peripheral portions of the right lower lobe lateral costophrenic sulcus measures 9 mm image 120. These nodules are most likely inflammatory. There are several small nodules in the left lower lobe and lingula some of the with a tree-in-bud appearance that are probably inflammatory as well the largest of these nodule is about 3-4 mm on image 122123. Upper Abdomen: Nodular density behind the stomach on the last image of the study measuring about 1.7 cm could represent adenopathy T recommend CT abdomen as clinically needed. Musculoskeletal: No chest wall mass or suspicious bone lesions identified. IMPRESSION: *Persistent significant fibro atelectatic changes and bronchiectatic changes bilateral with significant interval improvement in the large cavitary lesion of the left upper lobe and right middle lobe. *There is reticulonodular infiltrates with ill-defined nodular infiltrates of the peripheral portion of the right lower lobe. *There are several small nodules in the left lower lobe and lingula some of the with a tree-in-bud appearance that are probably inflammatory as well the largest of these nodule is about 3-4 mm. *Nodular density behind the stomach on the last image of the study measuring about 1.7 cm could represent adenopathy. Recommend CT abdomen as clinically needed. * Lung-RADS 3, probably benign findings. Short-term follow-up in 6 months is recommended with repeat low-dose chest CT without contrast (please use the following order, "CT CHEST LCS NODULE FOLLOW-UP W/O CM"). Electronically Signed   By: Shaaron Adler M.D.   On: 05/28/2023 09:38    Microbiology: Recent Results (from the past 240 hours)  Culture, blood (routine x 2)      Status: Abnormal   Collection Time: 06/07/23  3:41 PM   Specimen: BLOOD  Result Value Ref Range Status   Specimen Description   Final    BLOOD LAC Performed at [facility], [address] [place], [place] [address]    Special Requests   Final    BOTTLES DRAWN AEROBIC AND ANAEROBIC Blood Culture results may not be optimal due to an inadequate volume of blood received in culture bottles Performed at [facility], [address] [place], [place] [address]    Culture  Setup Time   Final    GRAM POSITIVE COCCI ANAEROBIC BOTTLE ONLY CRITICAL RESULT CALLED TO, READ BACK BY AND VERIFIED WITH: WILL ANDERSON 06/08/23 1025 MW    Culture (A)  Final    STAPHYLOCOCCUS EPIDERMIDIS THE SIGNIFICANCE OF ISOLATING THIS ORGANISM FROM A SINGLE SET OF BLOOD CULTURES WHEN MULTIPLE SETS ARE DRAWN IS UNCERTAIN. PLEASE NOTIFY THE MICROBIOLOGY DEPARTMENT WITHIN ONE WEEK IF SPECIATION AND SENSITIVITIES ARE REQUIRED. Performed at [facility]  [facility], [address] [place], [place] [address]    Report Status  FINAL  Final  Blood Culture ID Panel (Reflexed)     Status: Abnormal   Collection Time: 06/07/23  3:41 PM  Result Value Ref Range Status   Enterococcus faecalis NOT DETECTED NOT DETECTED Final   Enterococcus Faecium NOT DETECTED NOT DETECTED Final   Listeria monocytogenes NOT DETECTED NOT DETECTED Final   Staphylococcus species DETECTED (A) NOT DETECTED Final    Comment: CRITICAL RESULT CALLED TO, READ BACK BY AND VERIFIED WITH: WILL ANDERSON 06/08/23 1025 MW    Staphylococcus aureus (BCID) NOT DETECTED NOT DETECTED Final   Staphylococcus epidermidis DETECTED (A) NOT DETECTED Final    Comment: Methicillin (oxacillin) resistant coagulase negative staphylococcus. Possible blood culture contaminant (unless isolated from more than one blood culture draw or clinical case suggests pathogenicity). No antibiotic treatment is indicated for blood  culture contaminants. CRITICAL RESULT  CALLED TO, READ BACK BY AND VERIFIED WITH: WILL ANDERSON 06/08/23 1025 MW    Staphylococcus lugdunensis NOT DETECTED NOT DETECTED Final   Streptococcus species NOT DETECTED NOT DETECTED Final   Streptococcus agalactiae NOT DETECTED NOT DETECTED Final   Streptococcus pneumoniae NOT DETECTED NOT DETECTED Final   Streptococcus pyogenes NOT DETECTED NOT DETECTED Final   A.calcoaceticus-baumannii NOT DETECTED NOT DETECTED Final   Bacteroides fragilis NOT DETECTED NOT DETECTED Final   Enterobacterales NOT DETECTED NOT DETECTED Final   Enterobacter cloacae complex NOT DETECTED NOT DETECTED Final   Escherichia coli NOT DETECTED NOT DETECTED Final   Klebsiella aerogenes NOT DETECTED NOT DETECTED Final   Klebsiella oxytoca NOT DETECTED NOT DETECTED Final   Klebsiella pneumoniae NOT DETECTED NOT DETECTED Final   Proteus species NOT DETECTED NOT DETECTED Final   Salmonella species NOT DETECTED NOT DETECTED Final   Serratia marcescens NOT DETECTED NOT DETECTED Final   Haemophilus influenzae NOT DETECTED NOT DETECTED Final   Neisseria meningitidis NOT DETECTED NOT DETECTED Final   Pseudomonas aeruginosa NOT DETECTED NOT DETECTED Final   Stenotrophomonas maltophilia NOT DETECTED NOT DETECTED Final   Candida albicans NOT DETECTED NOT DETECTED Final   Candida auris NOT DETECTED NOT DETECTED Final   Candida glabrata NOT DETECTED NOT DETECTED Final   Candida krusei NOT DETECTED NOT DETECTED Final   Candida parapsilosis NOT DETECTED NOT DETECTED Final   Candida tropicalis NOT DETECTED NOT DETECTED Final   Cryptococcus neoformans/gattii NOT DETECTED NOT DETECTED Final   Methicillin resistance mecA/C DETECTED (A) NOT DETECTED Final    Comment: CRITICAL RESULT CALLED TO, READ BACK BY AND VERIFIED WITH: WILL ANDERSON 06/08/23 1025 MW Performed at [facility], [address] [place], [place] [address]   Resp panel by RT-PCR (RSV, Flu A&B, Covid) Anterior Nasal Swab     Status: None   Collection  Time: 06/07/23  3:42 PM   Specimen: Anterior Nasal Swab  Result Value Ref Range Status   SARS Coronavirus 2 by RT PCR NEGATIVE NEGATIVE Final    Comment: (NOTE) SARS-CoV-2 target nucleic acids are NOT DETECTED.  The SARS-CoV-2 RNA is generally detectable in upper respiratory specimens during the acute phase of infection. The lowest concentration of SARS-CoV-2 viral copies this assay can detect is 138 copies/mL. A negative result does not preclude SARS-Cov-2 infection and should not be used as the sole basis for treatment or other patient management decisions. A negative result may occur with  improper specimen collection/handling, submission of specimen other than nasopharyngeal swab, presence of viral mutation(s) within the areas  targeted by this assay, and inadequate number of viral copies(<138 copies/mL). A negative result must be combined with clinical observations, patient history, and epidemiological information. The expected result is Negative.  Fact Sheet for Patients:  BloggerCourse.com  Fact Sheet for Healthcare Providers:  SeriousBroker.it  This test is no t yet approved or cleared by the [place] FDA and  has been authorized for detection and/or diagnosis of SARS-CoV-2 by FDA under an Emergency Use Authorization (EUA). This EUA will remain  in effect (meaning this test can be used) for the duration of the COVID-19 declaration under Section 564(b)(1) of the Act, 21 U.S.C.section 360bbb-3(b)(1), unless the authorization is terminated  or revoked sooner.       Influenza A by PCR NEGATIVE NEGATIVE Final   Influenza B by PCR NEGATIVE NEGATIVE Final    Comment: (NOTE) The Xpert Xpress SARS-CoV-2/FLU/RSV plus assay is intended as an aid in the diagnosis of influenza from Nasopharyngeal swab specimens and should not be used as a sole basis for treatment. Nasal washings and aspirates are unacceptable for Xpert Xpress  SARS-CoV-2/FLU/RSV testing.  Fact Sheet for Patients: BloggerCourse.com  Fact Sheet for Healthcare Providers: SeriousBroker.it  This test is not yet approved or cleared by the [place] FDA and has been authorized for detection and/or diagnosis of SARS-CoV-2 by FDA under an Emergency Use Authorization (EUA). This EUA will remain in effect (meaning this test can be used) for the duration of the COVID-19 declaration under Section 564(b)(1) of the Act, 21 U.S.C. section 360bbb-3(b)(1), unless the authorization is terminated or revoked.     Resp Syncytial Virus by PCR NEGATIVE NEGATIVE Final    Comment: (NOTE) Fact Sheet for Patients: BloggerCourse.com  Fact Sheet for Healthcare Providers: SeriousBroker.it  This test is not yet approved or cleared by the [place] FDA and has been authorized for detection and/or diagnosis of SARS-CoV-2 by FDA under an Emergency Use Authorization (EUA). This EUA will remain in effect (meaning this test can be used) for the duration of the COVID-19 declaration under Section 564(b)(1) of the Act, 21 U.S.C. section 360bbb-3(b)(1), unless the authorization is terminated or revoked.  Performed at [facility], [address] [place], [place] [address]   Culture, blood (routine x 2)     Status: None (Preliminary result)   Collection Time: 06/07/23  3:42 PM   Specimen: BLOOD  Result Value Ref Range Status   Specimen Description BLOOD RAC  Final   Special Requests   Final    BOTTLES DRAWN AEROBIC AND ANAEROBIC Blood Culture results may not be optimal due to an inadequate volume of blood received in culture bottles   Culture   Final    NO GROWTH 3 DAYS Performed at [facility], [address], [place], [place] [address]    Report Status PENDING  Incomplete  MRSA Next Gen by PCR, Nasal     Status: Abnormal   Collection  Time: 06/07/23  3:42 PM   Specimen: Nasal Mucosa; Nasal Swab  Result Value Ref Range Status   MRSA by PCR Next Gen DETECTED (A) NOT DETECTED Final    Comment: CRITICAL RESULT CALLED TO, READ BACK BY AND VERIFIED WITH: TUCKER, S. 2050 06/07/23 LRL (NOTE) The GeneXpert MRSA Assay (FDA approved for NASAL specimens only), is one component of a comprehensive MRSA colonization surveillance program. It is not intended to diagnose MRSA infection nor to guide or monitor treatment for MRSA infections. Test performance is not FDA approved in patients less than 2  years old. Performed at [facility], [address] [place], [place] [address]     Time spent: 20 minutes  Signed: Sunnie Nielsen, DO 

## 2023-07-08 NOTE — Progress Notes (Addendum)
 0800 Patient not as alert today. Only grunts and mouths words. Did not interact with Dr. Shirlee Latch either. Blood pressure low. 1000 Blood pressure 60/35 left arm and 53/36 in left arm. Dr. Shirlee Latch, Dr. Aundria Rud and Dr. Lyn Hollingshead notified. Levophed order received.1030 Talked with patient's wife about starting Levophed and that this would only give the patient hours to days to survive. Wife refused Levophed and made patient comfort care. Patient's heart rate slowly decreasing. Wife informed Dr. Lyn Hollingshead in to clarify comfort care order and answer questions. Ativan added to comfort care medications by Dr. Lyn Hollingshead. Wife reiterates that yes she wants comfort care. 1220 Patient pronounced dead. Wife with patient at his death.1345 Body taken to morgue.

## 2023-07-08 NOTE — Progress Notes (Signed)
 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 (+)orthopnea, LE edema. Hypotension per visiting nurse, so brought to ED.   03/31: to ED. Hypoxic, hypotensive, elevated troponin, and significantly elevated AST/ALT. Lactic acid 5.0. Bedside POCUS by EDP showed a dilated IVC, and diffuse b-lines in all lung fields. Tx IV antibiotics, IV furosemide, BiPAP, admitted to ICU w/ decompensated CHF, AKI, resp fail, AMS. Later evening started on amiodarone gtt for SVT, lasix gtt. Placed on heparin gtt and holding Eliquis. 04/01: Cardiology consult - Echo EF < 20% with no LV thrombus, mild LV dilation and mild LVH, moderate RV enlargement with severe RV dysfunction, mild-moderate MR. Started on milrinone gtt. Remains on lasix gtt, heparin gtt, amiodarone gtt. Pt DNR/DNI see IPAL note. Nephrology consult - not good candidate for dialysis given CHF. Continuing on BiPap 04/02: d/c lasix gtt, continuing amiodarone gtt while on milrinone gtt, volume status improving. Palliative care consult. Weaned to nasal cannula O2. Abx d/c.  04/03: holding lasix, continue milrinone and amio. BP continuing to drop and pt is mostly somnolent. Decision made to transition to comfort measures later this morning.      Consultants:  ICU (initial admission) Cardiology / Advanced Heart Failure Team Nephrology  Palliative care    Procedures/Surgeries: 04/01: L subclavian central line placement       ASSESSMENT & PLAN:   Comfort measures  Discontinuing  treatment-oriented orders including labs, diagnostics Focus on comfort care and symptoms management  Wife is decision-maker, I confirmed she knows what to expect and how the medications work, all questions were answered  O2 as needed for comfort / can discontinue supplementation Morphine as needed for air hunger symptoms    PRIOR TO COMFORT MEASURES - see hospital course for details of treatment:   #Acute on chronic systolic CHF, NYHA class IV, severe biventricular failure, EF < 20% #Hypotension d/t cardiogenic shock d/t HFrEF #CAD: Late-presented anterior MI in 12/09, patient had CABG in 1/10  #Mild troponin elevation with no trend this admission, suspect demand ischemia from shock/volume overload, have reasonably excluded ACS  #Acute hypoxemic respiratory failure: Suspect due to pulmonary edema. 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  #Elevated LFTs: Suspect shock liver d/t cardiogenic shock as above in setting HFrEF.   #Hypotension likely cardiogenic shock but covering broadly for septic shock component.   #1 BCx bottle (+) S epidermidis, suspect most likely contaminant.  Afebrile, WBCs trending down.  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  #Bronchiectasis with history of MAI: Chronic, suspect this is not his main issue currently.  #Sjogren's syndrome   #GOC.  He is DNR/DNI Palliative care following               Subjective / Brief ROS:  Patient unable to contribute   Family Communication: spoke with wife who is at bedside in ICU. See above - all questions answered, confirmed plan     Objective Findings:  Vitals:    0600  0700   0800  0900  BP: (!) 86/46 (!) 94/49 (!) 81/45 (!) 72/40  Pulse: 67 69 66 60  Resp: 18 19 15 14   Temp:   97.8 F (36.6 C)   TempSrc:   Oral   SpO2: 97% 99% 100% 100%  Weight:      Height:        Intake/Output  Summary (Last 24 hours) at  1106 Last data filed at  0900 Gross per 24 hour  Intake 798.58 ml  Output 580 ml  Net 218.58 ml   Filed Weights   06/08/23 0500 06/09/23 0449  0427  Weight: 70.5 kg 70.3 kg 70.8 kg    Examination:  Physical Exam Constitutional:      General: He is not in acute distress.    Appearance: He is ill-appearing.  Pulmonary:     Effort: No respiratory distress.          Scheduled Medications:   Chlorhexidine Gluconate Cloth  6 each Topical Daily    Continuous Infusions:  sodium chloride     amiodarone 30 mg/hr ( 0600)   morphine      PRN Medications:  glycopyrrolate **OR** glycopyrrolate **OR** glycopyrrolate, LORazepam, morphine, polyvinyl alcohol  Antimicrobials from admission:  Anti-infectives (From admission, onward)    Start     Dose/Rate Route Frequency Ordered Stop   06/07/23 1615  piperacillin-tazobactam (ZOSYN) IVPB 3.375 g        3.375 g 100 mL/hr over 30 Minutes Intravenous  Once 06/07/23 1612 06/07/23 1647   06/07/23 1615  vancomycin (VANCOREADY) IVPB 1500 mg/300 mL        1,500 mg 150 mL/hr over 120 Minutes Intravenous  Once 06/07/23 1612 06/07/23 1822           Data Reviewed:  I have personally reviewed the following...  CBC: Recent Labs  Lab 06/07/23 1542 06/08/23 0413 06/09/23 0443  0450  WBC 11.5* 11.6* 10.3 11.9*  NEUTROABS 9.4* 9.6*  --   --   HGB 10.4* 10.0* 9.1* 8.8*  HCT 34.2* 31.5* 30.3* 29.3*  MCV 87.5 84.0 88.3 88.0  PLT 304 258 166 135*   Basic Metabolic Panel: Recent Labs  Lab 06/07/23 2139 06/08/23 0214 06/08/23 0413 06/08/23 0549 06/08/23 1004 06/09/23 0443  0450  NA 136  --  140 141  --  138 141  K 5.0   < > 4.9 4.8 4.8 3.7 3.7  CL 101  --  102 102  --  98 100  CO2 19*  --  22 23  --  27 29  GLUCOSE 95  --  95 94  --  127* 122*  BUN 71*  --  76* 76*  --  82* 88*  CREATININE 2.89*  --  2.99* 3.00*  --  2.97* 3.17*  CALCIUM 8.7*   --  8.7* 8.4*  --  8.0* 8.4*  MG 2.3  --  2.6*  --   --  2.3 2.4  PHOS 6.8*  --   --  6.9*  --  6.2* 6.5*   < > = values in this interval not displayed.   GFR: Estimated Creatinine Clearance: 18.6 mL/min (A) (by C-G formula based on SCr of 3.17 mg/dL (H)). Liver Function Tests: Recent Labs  Lab 06/07/23 1542 06/07/23 2139 06/08/23 0413 06/08/23 0549 06/09/23 0443  0450  AST 1,428* 1,298* 1,424*  --   --  313*  ALT 1,139* 1,092* 1,235*  --   --  682*  ALKPHOS 71 66 66  --   --  60  BILITOT 2.0* 1.7* 1.9*  --   --  1.5*  PROT 7.0 6.2* 6.6  --   --  5.6*  ALBUMIN 3.3* 3.0* 3.3* 3.1* 2.8* 2.8*   No results for input(s): "LIPASE", "AMYLASE" in the last 168 hours. No results for input(s): "AMMONIA" in the last 168 hours. Coagulation Profile: Recent Labs  Lab 06/07/23 1542  INR 2.4*   Cardiac Enzymes: No results for input(s): "CKTOTAL", "CKMB", "CKMBINDEX", "TROPONINI" in the last 168 hours. BNP (last 3 results) No results for input(s): "PROBNP" in the last 8760 hours. HbA1C: No results for input(s): "HGBA1C" in the last 72 hours. CBG: Recent Labs  Lab 06/09/23 1659 06/09/23 1927 06/09/23 2321  0353  0740  GLUCAP 103* 96 99 97 104*   Lipid Profile: No results for input(s): "CHOL", "HDL", "LDLCALC", "TRIG", "CHOLHDL", "LDLDIRECT" in the last 72 hours. Thyroid Function Tests: Recent Labs    06/07/23 1730  TSH 2.029  FREET4 0.94   Anemia Panel: No results for input(s): "VITAMINB12", "FOLATE", "FERRITIN", "TIBC", "IRON", "RETICCTPCT" in the last 72 hours. Most Recent Urinalysis On File:     Component Value Date/Time   COLORURINE YELLOW (A) 02/11/2022 0258   APPEARANCEUR Cloudy (A) 10/01/2022 1414   LABSPEC 1.028 02/11/2022 0258   LABSPEC 1.006 12/16/2012 0322   PHURINE 5.0 02/11/2022 0258   GLUCOSEU Negative 10/01/2022 1414   GLUCOSEU Negative 12/16/2012 0322   HGBUR NEGATIVE 02/11/2022 0258   BILIRUBINUR Negative 10/01/2022 1414    BILIRUBINUR Negative 12/16/2012 0322   KETONESUR 20 (A) 02/11/2022 0258   PROTEINUR 3+ (A) 10/01/2022 1414   PROTEINUR 30 (A) 02/11/2022 0258   UROBILINOGEN 0.2 03/15/2008 1651   NITRITE Positive (A) 10/01/2022 1414   NITRITE NEGATIVE 02/11/2022 0258   LEUKOCYTESUR 2+ (A) 10/01/2022 1414   LEUKOCYTESUR NEGATIVE 02/11/2022 0258   LEUKOCYTESUR Trace 12/16/2012 0322   Sepsis Labs: @LABRCNTIP (procalcitonin:4,lacticidven:4) Microbiology: Recent Results (from the past 240 hours)  Culture, blood (routine x 2)     Status: Abnormal   Collection Time: 06/07/23  3:41 PM   Specimen: BLOOD  Result Value Ref Range Status   Specimen Description   Final    BLOOD LAC Performed at [facility], [address], [place], [place] [address]    Special Requests   Final    BOTTLES DRAWN AEROBIC AND ANAEROBIC Blood Culture results may not be optimal due to an inadequate volume of blood received in culture bottles Performed at [facility], [address] [place], [place] [address]    Culture  Setup Time   Final    GRAM POSITIVE COCCI ANAEROBIC BOTTLE ONLY CRITICAL RESULT CALLED TO, READ BACK BY AND VERIFIED WITH: WILL ANDERSON 06/08/23 1025 MW    Culture (A)  Final    STAPHYLOCOCCUS EPIDERMIDIS THE SIGNIFICANCE OF ISOLATING THIS ORGANISM FROM A SINGLE SET OF BLOOD CULTURES WHEN MULTIPLE SETS ARE DRAWN IS UNCERTAIN. PLEASE NOTIFY THE MICROBIOLOGY DEPARTMENT WITHIN ONE WEEK IF SPECIATION AND SENSITIVITIES ARE REQUIRED. Performed at [facility], [address] [place], [place] [address]    Report Status  FINAL  Final  Blood Culture ID Panel (Reflexed)     Status: Abnormal   Collection Time: 06/07/23  3:41 PM  Result Value Ref Range Status   Enterococcus faecalis NOT DETECTED NOT DETECTED Final   Enterococcus Faecium NOT DETECTED NOT DETECTED Final   Listeria monocytogenes NOT DETECTED NOT DETECTED Final   Staphylococcus species DETECTED (A) NOT DETECTED Final  Comment: CRITICAL RESULT CALLED TO, READ BACK BY AND VERIFIED WITH: WILL ANDERSON 06/08/23 1025 MW    Staphylococcus aureus (BCID) NOT DETECTED NOT DETECTED Final   Staphylococcus epidermidis DETECTED (A) NOT DETECTED Final    Comment: Methicillin (oxacillin) resistant coagulase negative staphylococcus. Possible blood culture contaminant (unless isolated from more than one blood culture draw or clinical case suggests pathogenicity). No antibiotic treatment is indicated for blood  culture contaminants. CRITICAL RESULT CALLED TO, READ BACK BY AND VERIFIED WITH: WILL ANDERSON 06/08/23 1025 MW    Staphylococcus lugdunensis NOT DETECTED NOT DETECTED Final   Streptococcus species NOT DETECTED NOT DETECTED Final   Streptococcus agalactiae NOT DETECTED NOT DETECTED Final   Streptococcus pneumoniae NOT DETECTED NOT DETECTED Final   Streptococcus pyogenes NOT DETECTED NOT DETECTED Final   A.calcoaceticus-baumannii NOT DETECTED NOT DETECTED Final   Bacteroides fragilis NOT DETECTED NOT DETECTED Final   Enterobacterales NOT DETECTED NOT DETECTED Final   Enterobacter cloacae complex NOT DETECTED NOT DETECTED Final   Escherichia coli NOT DETECTED NOT DETECTED Final   Klebsiella aerogenes NOT DETECTED NOT DETECTED Final   Klebsiella oxytoca NOT DETECTED NOT DETECTED Final   Klebsiella pneumoniae NOT DETECTED NOT DETECTED Final   Proteus species NOT DETECTED NOT DETECTED Final   Salmonella species NOT DETECTED NOT DETECTED Final   Serratia marcescens NOT DETECTED NOT DETECTED Final   Haemophilus influenzae NOT DETECTED NOT DETECTED Final   Neisseria meningitidis NOT DETECTED NOT DETECTED Final   Pseudomonas aeruginosa NOT DETECTED NOT DETECTED Final   Stenotrophomonas maltophilia NOT DETECTED NOT DETECTED Final   Candida albicans NOT DETECTED NOT DETECTED Final   Candida auris NOT DETECTED NOT DETECTED Final   Candida glabrata NOT DETECTED NOT DETECTED Final   Candida krusei NOT DETECTED NOT DETECTED  Final   Candida parapsilosis NOT DETECTED NOT DETECTED Final   Candida tropicalis NOT DETECTED NOT DETECTED Final   Cryptococcus neoformans/gattii NOT DETECTED NOT DETECTED Final   Methicillin resistance mecA/C DETECTED (A) NOT DETECTED Final    Comment: CRITICAL RESULT CALLED TO, READ BACK BY AND VERIFIED WITH: WILL ANDERSON 06/08/23 1025 MW Performed at [facility], [address] [place], [place] [address]   Resp panel by RT-PCR (RSV, Flu A&B, Covid) Anterior Nasal Swab     Status: None   Collection Time: 06/07/23  3:42 PM   Specimen: Anterior Nasal Swab  Result Value Ref Range Status   SARS Coronavirus 2 by RT PCR NEGATIVE NEGATIVE Final    Comment: (NOTE) SARS-CoV-2 target nucleic acids are NOT DETECTED.  The SARS-CoV-2 RNA is generally detectable in upper respiratory specimens during the acute phase of infection. The lowest concentration of SARS-CoV-2 viral copies this assay can detect is 138 copies/mL. A negative result does not preclude SARS-Cov-2 infection and should not be used as the sole basis for treatment or other patient management decisions. A negative result may occur with  improper specimen collection/handling, submission of specimen other than nasopharyngeal swab, presence of viral mutation(s) within the areas targeted by this assay, and inadequate number of viral copies(<138 copies/mL). A negative result must be combined with clinical observations, patient history, and epidemiological information. The expected result is Negative.  Fact Sheet for Patients:  BloggerCourse.com  Fact Sheet for Healthcare Providers:  SeriousBroker.it  This test is no t yet approved or cleared by the [place] FDA and  has been authorized for detection and/or diagnosis of SARS-CoV-2 by FDA under an Emergency Use Authorization (EUA). This EUA will remain  in  effect (meaning this test can be used) for the duration of  the COVID-19 declaration under Section 564(b)(1) of the Act, 21 U.S.C.section 360bbb-3(b)(1), unless the authorization is terminated  or revoked sooner.       Influenza A by PCR NEGATIVE NEGATIVE Final   Influenza B by PCR NEGATIVE NEGATIVE Final    Comment: (NOTE) The Xpert Xpress SARS-CoV-2/FLU/RSV plus assay is intended as an aid in the diagnosis of influenza from Nasopharyngeal swab specimens and should not be used as a sole basis for treatment. Nasal washings and aspirates are unacceptable for Xpert Xpress SARS-CoV-2/FLU/RSV testing.  Fact Sheet for Patients: BloggerCourse.com  Fact Sheet for Healthcare Providers: SeriousBroker.it  This test is not yet approved or cleared by the [place] FDA and has been authorized for detection and/or diagnosis of SARS-CoV-2 by FDA under an Emergency Use Authorization (EUA). This EUA will remain in effect (meaning this test can be used) for the duration of the COVID-19 declaration under Section 564(b)(1) of the Act, 21 U.S.C. section 360bbb-3(b)(1), unless the authorization is terminated or revoked.     Resp Syncytial Virus by PCR NEGATIVE NEGATIVE Final    Comment: (NOTE) Fact Sheet for Patients: BloggerCourse.com  Fact Sheet for Healthcare Providers: SeriousBroker.it  This test is not yet approved or cleared by the [place] FDA and has been authorized for detection and/or diagnosis of SARS-CoV-2 by FDA under an Emergency Use Authorization (EUA). This EUA will remain in effect (meaning this test can be used) for the duration of the COVID-19 declaration under Section 564(b)(1) of the Act, 21 U.S.C. section 360bbb-3(b)(1), unless the authorization is terminated or revoked.  Performed at [facility], [address] [place], [place] [address]   Culture, blood (routine x 2)     Status: None (Preliminary result)    Collection Time: 06/07/23  3:42 PM   Specimen: BLOOD  Result Value Ref Range Status   Specimen Description BLOOD RAC  Final   Special Requests   Final    BOTTLES DRAWN AEROBIC AND ANAEROBIC Blood Culture results may not be optimal due to an inadequate volume of blood received in culture bottles   Culture   Final    NO GROWTH 3 DAYS Performed at [facility], [address] [place], [place] [address]    Report Status PENDING  Incomplete  MRSA Next Gen by PCR, Nasal     Status: Abnormal   Collection Time: 06/07/23  3:42 PM   Specimen: Nasal Mucosa; Nasal Swab  Result Value Ref Range Status   MRSA by PCR Next Gen DETECTED (A) NOT DETECTED Final    Comment: CRITICAL RESULT CALLED TO, READ BACK BY AND VERIFIED WITH: TUCKER, S. 2050 06/07/23 LRL (NOTE) The GeneXpert MRSA Assay (FDA approved for NASAL specimens only), is one component of a comprehensive MRSA colonization surveillance program. It is not intended to diagnose MRSA infection nor to guide or monitor treatment for MRSA infections. Test performance is not FDA approved in patients less than 37 years old. Performed at [facility], [address] [place], [place] [address]       Radiology Studies last 3 days: DG Chest 1 View Result Date: 06/08/2023 CLINICAL DATA:  Central line placement EXAM: CHEST  1 VIEW COMPARISON:  06/07/2023 FINDINGS: Left-sided central venous catheter extends to the proximal SVC. No pneumothorax. Coarse chronic interstitial opacities bilaterally with some patchy airspace disease in both upper lobes as before. Heart size and mediastinal contours are within normal limits. CABG markers.  No effusion. Sternotomy wires. IMPRESSION: Left-sided central venous catheter to the proximal SVC. No pneumothorax. Electronically Signed   By: Corlis Leak M.D.   On: 06/08/2023 17:18   ECHOCARDIOGRAM COMPLETE Result Date: 06/08/2023    ECHOCARDIOGRAM REPORT   Patient Name:   Nicholas Alvarez [facility] Date of Exam:  06/08/2023 Medical Rec #:  [id number]      Height:       70.0 in Accession #:    [id number]     Weight:       155.4 lb Date of Birth:  1940/03/16      BSA:          1.875 m Patient Age:    82 years       BP:           111/59 mmHg Patient Gender: M              HR:           91 bpm. Exam Location:  [facility] Procedure: 2D Echo, 3D Echo, Cardiac Doppler, Color Doppler and Intracardiac            Opacification Agent (Both Spectral and Color Flow Doppler were            utilized during procedure). Indications:     Dyapnea  History:         Patient has prior history of Echocardiogram examinations, most                  recent 11/02/2022. CAD and Previous Myocardial Infarction, Prior                  CABG; Signs/Symptoms:Dyspnea. Pulmonary embolus.  Sonographer:     Mikki Harbor Referring Phys:  [id number] KHABIB DGAYLI Diagnosing Phys: Yvonne Kendall MD IMPRESSIONS  1. Left ventricular ejection fraction, by estimation, is <20%. Left ventricular ejection fraction by 3D volume is 12 %. The left ventricle has severely decreased function. The left ventricle demonstrates global hypokinesis. The left ventricular internal  cavity size was mildly dilated. There is moderate left ventricular hypertrophy. Left ventricular diastolic parameters are consistent with Grade II diastolic dysfunction (pseudonormalization). Elevated left atrial pressure.  2. Right ventricular systolic function is severely reduced. The right ventricular size is moderately enlarged. There is severely elevated pulmonary artery systolic pressure.  3. Left atrial size was mildly dilated.  4. Right atrial size was mildly dilated.  5. Large pleural effusion in the left lateral region.  6. The mitral valve is abnormal. Mild to moderate mitral valve regurgitation.  7. The aortic valve is tricuspid. There is mild calcification of the aortic valve. There is moderate thickening of the aortic valve. Aortic valve regurgitation is not visualized. Aortic valve  sclerosis/calcification is present, without any evidence of aortic stenosis.  8. Mildly dilated pulmonary artery. Conclusion(s)/Recommendation(s): No left ventricular mural or apical thrombus/thrombi. FINDINGS  Left Ventricle: Left ventricular ejection fraction, by estimation, is <20%. Left ventricular ejection fraction by 3D volume is 12 %. The left ventricle has severely decreased function. The left ventricle demonstrates global hypokinesis. Definity contrast agent was given IV to delineate the left ventricular endocardial borders. The left ventricular internal cavity size was mildly dilated. There is moderate left ventricular hypertrophy. Left ventricular diastolic parameters are consistent with Grade II diastolic dysfunction (pseudonormalization). Elevated left atrial pressure. Right Ventricle: The right ventricular size is moderately enlarged. No increase in right ventricular wall thickness. Right ventricular systolic function is severely reduced. There is severely elevated pulmonary artery  systolic pressure. The tricuspid regurgitant velocity is 3.51 m/s, and with an assumed right atrial pressure of 15 mmHg, the estimated right ventricular systolic pressure is 64.3 mmHg. Left Atrium: Left atrial size was mildly dilated. Right Atrium: Right atrial size was mildly dilated. Pericardium: There is no evidence of pericardial effusion. Mitral Valve: The mitral valve is abnormal. There is mild thickening of the mitral valve leaflet(s). Mild to moderate mitral valve regurgitation. MV peak gradient, 4.3 mmHg. The mean mitral valve gradient is 1.0 mmHg. Tricuspid Valve: The tricuspid valve is normal in structure. Tricuspid valve regurgitation is mild. Aortic Valve: The aortic valve is tricuspid. There is mild calcification of the aortic valve. There is moderate thickening of the aortic valve. Aortic valve regurgitation is not visualized. Aortic valve sclerosis/calcification is present, without any evidence of aortic  stenosis. Aortic valve mean gradient measures 2.0 mmHg. Aortic valve peak gradient measures 4.3 mmHg. Aortic valve area, by VTI measures 2.98 cm. Pulmonic Valve: The pulmonic valve was normal in structure. Pulmonic valve regurgitation is trivial. No evidence of pulmonic stenosis. Aorta: The aortic root is normal in size and structure. Pulmonary Artery: The pulmonary artery is mildly dilated. IAS/Shunts: The interatrial septum was not well visualized. Additional Comments: 3D was performed not requiring image post processing on an independent workstation and was abnormal. There is a large pleural effusion in the left lateral region.  LEFT VENTRICLE PLAX 2D LVIDd:         5.60 cm         Diastology LVIDs:         5.10 cm         LV e' medial:    5.03 cm/s LV PW:         1.30 cm         LV E/e' medial:  17.5 LV IVS:        1.20 cm         LV e' lateral:   7.58 cm/s LVOT diam:     2.00 cm         LV E/e' lateral: 11.6 LV SV:         52 LV SV Index:   27 LVOT Area:     3.14 cm        3D Volume EF                                LV 3D EF:    Left                                             ventricul                                             ar                                             ejection  fraction                                             by 3D                                             volume is                                             12 %.                                 3D Volume EF:                                3D EF:        12 % RIGHT VENTRICLE RV Basal diam:  4.90 cm LEFT ATRIUM             Index        RIGHT ATRIUM           Index LA diam:        5.20 cm 2.77 cm/m   RA Area:     21.30 cm LA Vol (A2C):   97.3 ml 51.89 ml/m  RA Volume:   75.30 ml  40.16 ml/m LA Vol (A4C):   79.7 ml 42.50 ml/m LA Biplane Vol: 88.7 ml 47.30 ml/m  AORTIC VALVE                    PULMONIC VALVE AV Area (Vmax):    3.09 cm     PV Vmax:       0.69 m/s AV Area (Vmean):    2.49 cm     PV Peak grad:  1.9 mmHg AV Area (VTI):     2.98 cm AV Vmax:           103.85 cm/s AV Vmean:          60.100 cm/s AV VTI:            0.173 m AV Peak Grad:      4.3 mmHg AV Mean Grad:      2.0 mmHg LVOT Vmax:         102.00 cm/s LVOT Vmean:        47.700 cm/s LVOT VTI:          0.164 m LVOT/AV VTI ratio: 0.95  AORTA Ao Root diam: 3.70 cm MITRAL VALVE               TRICUSPID VALVE MV Area (PHT): 7.02 cm    TR Peak grad:   49.3 mmHg MV Area VTI:   1.51 cm    TR Vmax:        351.00 cm/s MV Peak grad:  4.3 mmHg MV Mean grad:  1.0 mmHg    SHUNTS MV Vmax:       1.04 m/s    Systemic VTI:  0.16 m MV Vmean:      44.1 cm/s   Systemic Diam:  2.00 cm MV Decel Time: 108 msec MV E velocity: 88.00 cm/s MV A velocity: 46.10 cm/s MV E/A ratio:  1.91 Yvonne Kendall MD Electronically signed by Yvonne Kendall MD Signature Date/Time: 06/08/2023/10:11:45 AM    Final    US RENAL Result Date: 06/07/2023 CLINICAL DATA:  Acute renal insufficiency EXAM: RENAL / URINARY TRACT ULTRASOUND COMPLETE COMPARISON:  10/30/2022 FINDINGS: Right Kidney: Not visualized. Left Kidney: Renal measurements: 8.8 x 4.1 x 3.7 cm = volume: 69.1 mL. Echogenicity within normal limits. No mass or hydronephrosis visualized. Bladder: Not visualized. Other: Examination is extremely limited due to patient cooperation and refusal to complete the evaluation. Incidental note is made of a left pleural effusion. IMPRESSION: 1. Limited study due to patient cooperation and refusal to complete the examination. The right kidney and bladder are not visualized. 2. Unremarkable left kidney. Electronically Signed   By: Sharlet Salina M.D.   On: 06/07/2023 21:32   US Abdomen Limited RUQ (LIVER/GB) Result Date: 06/07/2023 CLINICAL DATA:  Transaminitis. EXAM: ULTRASOUND ABDOMEN LIMITED RIGHT UPPER QUADRANT COMPARISON:  None Available. FINDINGS: Gallbladder: No gallstones or wall thickening visualized (1.5 mm). No sonographic Murphy sign noted by sonographer. Common  bile duct: Diameter: 2.0 mm Liver: No focal lesion identified. Within normal limits in parenchymal echogenicity. Portal vein is patent on color Doppler imaging with normal direction of blood flow towards the liver. Other: The study is technically limited secondary to inability of the patient to hold breath or lay flat during the exam, as per the ultrasound technologist. IMPRESSION: Unremarkable right upper quadrant ultrasound. Electronically Signed   By: Aram Candela M.D.   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 The cardiac silhouette is enlarged. Chronic areas of scarring and bronchiectasis are again seen throughout the lungs, with no new areas of consolidation identified. Trace bilateral pleural effusions. No pneumothorax. No acute bony abnormalities. IMPRESSION: 1. 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 2. Trace bilateral pleural effusions. 3. No acute airspace disease. Electronically Signed   By: Sharlet Salina M.D.   On: 06/07/2023 16:33       Time spent: 50 min     Sunnie Nielsen, DO [facility] , 11:06 AM    Dictation software may have been used to generate the above note. Typos may occur and escape review in typed/dictated notes. Please contact Dr Lyn Hollingshead directly for clarity if needed.  Staff may message me via secure chat in Epic  but this may not receive an immediate response,  please page me for urgent matters!  If 7PM-7AM, please contact night coverage [email or web address]

## 2023-07-08 NOTE — Plan of Care (Signed)
  Problem: Clinical Measurements: Goal: Ability to maintain clinical measurements within normal limits will improve Outcome: Progressing Goal: Will remain free from infection Outcome: Progressing Goal: Respiratory complications will improve Outcome: Progressing   Problem: Clinical Measurements: Goal: Ability to maintain clinical measurements within normal limits will improve Outcome: Progressing   Problem: Clinical Measurements: Goal: Will remain free from infection Outcome: Progressing   Problem: Clinical Measurements: Goal: Respiratory complications will improve Outcome: Progressing   Problem: Coping: Goal: Level of anxiety will decrease Outcome: Progressing   Problem: Coping: Goal: Level of anxiety will decrease Outcome: Progressing   Problem: Elimination: Goal: Will not experience complications related to urinary retention Outcome: Progressing   Problem: Elimination: Goal: Will not experience complications related to urinary retention Outcome: Progressing   Problem: Skin Integrity: Goal: Risk for impaired skin integrity will decrease Outcome: Progressing   Problem: Skin Integrity: Goal: Risk for impaired skin integrity will decrease Outcome: Progressing

## 2023-07-08 DEATH — deceased
